# Patient Record
Sex: Female | Born: 1960 | Race: White | Hispanic: No | Marital: Married | Smoking: Former smoker
Health system: Southern US, Community
[De-identification: ages and names within clinical notes are randomized; demographics above are authoritative.]

## PROBLEM LIST (undated history)

## (undated) DIAGNOSIS — L719 Rosacea, unspecified: Secondary | ICD-10-CM

## (undated) DIAGNOSIS — R519 Headache, unspecified: Secondary | ICD-10-CM

## (undated) DIAGNOSIS — Z87410 Personal history of cervical dysplasia: Secondary | ICD-10-CM

## (undated) DIAGNOSIS — D649 Anemia, unspecified: Secondary | ICD-10-CM

## (undated) DIAGNOSIS — Z531 Procedure and treatment not carried out because of patient's decision for reasons of belief and group pressure: Secondary | ICD-10-CM

## (undated) DIAGNOSIS — T753XXA Motion sickness, initial encounter: Secondary | ICD-10-CM

## (undated) DIAGNOSIS — K635 Polyp of colon: Secondary | ICD-10-CM

## (undated) DIAGNOSIS — R51 Headache: Secondary | ICD-10-CM

## (undated) DIAGNOSIS — T8859XA Other complications of anesthesia, initial encounter: Secondary | ICD-10-CM

## (undated) DIAGNOSIS — N3941 Urge incontinence: Secondary | ICD-10-CM

## (undated) DIAGNOSIS — Z803 Family history of malignant neoplasm of breast: Secondary | ICD-10-CM

## (undated) DIAGNOSIS — K219 Gastro-esophageal reflux disease without esophagitis: Secondary | ICD-10-CM

## (undated) DIAGNOSIS — IMO0001 Reserved for inherently not codable concepts without codable children: Secondary | ICD-10-CM

## (undated) DIAGNOSIS — M1990 Unspecified osteoarthritis, unspecified site: Secondary | ICD-10-CM

## (undated) DIAGNOSIS — I10 Essential (primary) hypertension: Secondary | ICD-10-CM

## (undated) DIAGNOSIS — F32A Depression, unspecified: Secondary | ICD-10-CM

## (undated) DIAGNOSIS — N6019 Diffuse cystic mastopathy of unspecified breast: Secondary | ICD-10-CM

## (undated) DIAGNOSIS — Z8489 Family history of other specified conditions: Secondary | ICD-10-CM

## (undated) DIAGNOSIS — F419 Anxiety disorder, unspecified: Secondary | ICD-10-CM

## (undated) DIAGNOSIS — F5101 Primary insomnia: Secondary | ICD-10-CM

## (undated) DIAGNOSIS — M797 Fibromyalgia: Secondary | ICD-10-CM

## (undated) DIAGNOSIS — J45909 Unspecified asthma, uncomplicated: Secondary | ICD-10-CM

## (undated) DIAGNOSIS — J189 Pneumonia, unspecified organism: Secondary | ICD-10-CM

## (undated) HISTORY — PX: KNEE ARTHROSCOPY: SUR90

## (undated) HISTORY — PX: BACK SURGERY: SHX140

## (undated) HISTORY — DX: Family history of malignant neoplasm of breast: Z80.3

---

## 1990-06-07 HISTORY — PX: LAMINECTOMY: SHX219

## 2005-08-11 ENCOUNTER — Emergency Department: Payer: Self-pay | Admitting: Emergency Medicine

## 2006-06-07 HISTORY — PX: HAND SURGERY: SHX662

## 2009-06-07 HISTORY — PX: TOTAL ABDOMINAL HYSTERECTOMY: SHX209

## 2009-08-19 ENCOUNTER — Ambulatory Visit: Payer: Self-pay

## 2009-11-25 ENCOUNTER — Ambulatory Visit: Payer: Self-pay | Admitting: Unknown Physician Specialty

## 2009-12-23 ENCOUNTER — Ambulatory Visit: Payer: Self-pay | Admitting: Family Medicine

## 2010-01-01 ENCOUNTER — Ambulatory Visit: Payer: Self-pay | Admitting: Dermatology

## 2010-04-28 ENCOUNTER — Ambulatory Visit: Payer: Self-pay | Admitting: Unknown Physician Specialty

## 2010-05-06 ENCOUNTER — Ambulatory Visit: Payer: Self-pay | Admitting: Unknown Physician Specialty

## 2010-05-11 LAB — PATHOLOGY REPORT

## 2013-05-29 DIAGNOSIS — J45909 Unspecified asthma, uncomplicated: Secondary | ICD-10-CM | POA: Insufficient documentation

## 2014-01-05 HISTORY — PX: MENISCUS REPAIR: SHX5179

## 2014-06-04 LAB — TSH: TSH: 1.3 u[IU]/mL (ref <=5.90)

## 2014-06-04 LAB — LIPID PANEL
CHOLESTEROL: 223 mg/dL — AB (ref 0–200)
HDL: 52 mg/dL (ref 35–70)
LDL Cholesterol: 133 mg/dL
TRIGLYCERIDES: 189 mg/dL — AB (ref 40–160)

## 2014-06-04 LAB — HM MAMMOGRAPHY

## 2014-12-03 ENCOUNTER — Encounter: Payer: Self-pay | Admitting: Internal Medicine

## 2014-12-03 ENCOUNTER — Other Ambulatory Visit: Payer: Self-pay | Admitting: Internal Medicine

## 2014-12-03 DIAGNOSIS — R7989 Other specified abnormal findings of blood chemistry: Secondary | ICD-10-CM | POA: Insufficient documentation

## 2014-12-03 DIAGNOSIS — N951 Menopausal and female climacteric states: Secondary | ICD-10-CM | POA: Insufficient documentation

## 2014-12-03 DIAGNOSIS — L719 Rosacea, unspecified: Secondary | ICD-10-CM | POA: Insufficient documentation

## 2014-12-03 DIAGNOSIS — J4520 Mild intermittent asthma, uncomplicated: Secondary | ICD-10-CM | POA: Insufficient documentation

## 2014-12-03 DIAGNOSIS — F5101 Primary insomnia: Secondary | ICD-10-CM | POA: Insufficient documentation

## 2014-12-03 DIAGNOSIS — M797 Fibromyalgia: Secondary | ICD-10-CM | POA: Insufficient documentation

## 2014-12-03 DIAGNOSIS — F32A Depression, unspecified: Secondary | ICD-10-CM | POA: Insufficient documentation

## 2014-12-03 DIAGNOSIS — Z803 Family history of malignant neoplasm of breast: Secondary | ICD-10-CM | POA: Insufficient documentation

## 2014-12-03 DIAGNOSIS — F329 Major depressive disorder, single episode, unspecified: Secondary | ICD-10-CM | POA: Insufficient documentation

## 2014-12-03 DIAGNOSIS — I10 Essential (primary) hypertension: Secondary | ICD-10-CM | POA: Insufficient documentation

## 2014-12-03 DIAGNOSIS — Z8711 Personal history of peptic ulcer disease: Secondary | ICD-10-CM | POA: Insufficient documentation

## 2014-12-03 DIAGNOSIS — G43009 Migraine without aura, not intractable, without status migrainosus: Secondary | ICD-10-CM | POA: Insufficient documentation

## 2014-12-03 DIAGNOSIS — N3941 Urge incontinence: Secondary | ICD-10-CM | POA: Insufficient documentation

## 2015-03-21 ENCOUNTER — Other Ambulatory Visit: Payer: Self-pay

## 2015-03-21 MED ORDER — LISINOPRIL-HYDROCHLOROTHIAZIDE 20-12.5 MG PO TABS
1.0000 | ORAL_TABLET | Freq: Every day | ORAL | Status: DC
Start: 2015-03-21 — End: 2015-04-22

## 2015-03-21 MED ORDER — SUMATRIPTAN SUCCINATE 100 MG PO TABS: 100.0000 mg | ORAL_TABLET | Freq: Every day | ORAL | Status: DC | PRN

## 2015-04-22 ENCOUNTER — Ambulatory Visit (INDEPENDENT_AMBULATORY_CARE_PROVIDER_SITE_OTHER): Payer: BLUE CROSS/BLUE SHIELD | Admitting: Internal Medicine

## 2015-04-22 ENCOUNTER — Other Ambulatory Visit: Payer: Self-pay | Admitting: Internal Medicine

## 2015-04-22 ENCOUNTER — Encounter: Payer: Self-pay | Admitting: Internal Medicine

## 2015-04-22 VITALS — BP 110/64 | HR 76 | Ht 66.0 in | Wt 213.2 lb

## 2015-04-22 DIAGNOSIS — Z114 Encounter for screening for human immunodeficiency virus [HIV]: Secondary | ICD-10-CM

## 2015-04-22 DIAGNOSIS — I10 Essential (primary) hypertension: Secondary | ICD-10-CM

## 2015-04-22 DIAGNOSIS — Z8711 Personal history of peptic ulcer disease: Secondary | ICD-10-CM | POA: Diagnosis not present

## 2015-04-22 DIAGNOSIS — J4520 Mild intermittent asthma, uncomplicated: Secondary | ICD-10-CM | POA: Diagnosis not present

## 2015-04-22 DIAGNOSIS — R7989 Other specified abnormal findings of blood chemistry: Secondary | ICD-10-CM | POA: Diagnosis not present

## 2015-04-22 DIAGNOSIS — Z1159 Encounter for screening for other viral diseases: Secondary | ICD-10-CM

## 2015-04-22 MED ORDER — LISINOPRIL-HYDROCHLOROTHIAZIDE 20-12.5 MG PO TABS: 1.0000 | ORAL_TABLET | Freq: Every day | ORAL | Status: DC

## 2015-04-22 NOTE — Progress Notes (Signed)
Date:  04/22/2015   Name:  Alicia Nichols   DOB:  Dec 20, 1960   MRN:  [id number]   Chief Complaint: Hypertension Hypertension This is a chronic problem. The current episode started more than 1 year ago. The problem is unchanged. Associated symptoms include headaches. Pertinent negatives include no chest pain, palpitations or shortness of breath. Past treatments include ACE inhibitors and diuretics. The current treatment provides significant improvement. There are no compliance problems.   Gastroesophageal Reflux She reports no abdominal pain, no chest pain or no choking. This is a chronic problem. The problem occurs rarely. The problem has been unchanged. Pertinent negatives include no fatigue. There are no known risk factors. She has tried a histamine-2 antagonist for the symptoms.  Migraine  This is a recurrent problem. The current episode started more than 1 year ago. The problem has been unchanged. The pain does not radiate. Pertinent negatives include no abdominal pain, hearing loss or numbness. The symptoms are aggravated by weather changes. She has tried triptans for the symptoms. Her past medical history is significant for hypertension.       Review of Systems  Constitutional: Negative for chills, diaphoresis and fatigue.  HENT: Negative for hearing loss.   Eyes: Negative for visual disturbance.  Respiratory: Negative for choking, chest tightness and shortness of breath.   Cardiovascular: Negative for chest pain, palpitations and leg swelling.  Gastrointestinal: Negative for abdominal pain.  Musculoskeletal: Positive for myalgias and arthralgias. Negative for joint swelling.  Skin: Negative for rash.  Neurological: Positive for headaches. Negative for tremors, light-headedness and numbness.  Hematological: Negative for adenopathy.  Psychiatric/Behavioral: Negative for dysphoric mood. The patient is not nervous/anxious.     Patient Active Problem List   Diagnosis Date Noted  .  Essential (primary) hypertension 12/03/2014  . Abnormal thyroid stimulating hormone (TSH) level 12/03/2014  . Urge incontinence 12/03/2014  . H/O peptic ulcer 12/03/2014  . Myalgia and myositis 12/03/2014  . Acne erythematosa 12/03/2014  . Migraine without aura and without status migrainosus, not intractable 12/03/2014  . Clinical depression 12/03/2014  . Asthma, mild intermittent 12/03/2014  . Hot flash, menopausal 12/03/2014  . Idiopathic insomnia 12/03/2014  . Family history of breast cancer 12/03/2014    Prior to Admission medications   Medication Sig Start Date End Date Taking? Authorizing Provider  Chlorzoxazone (LORZONE) 750 MG TABS Take 1 tablet by mouth daily.   Yes Historical Provider, MD  diclofenac sodium (VOLTAREN) 1 % GEL Apply topically 4 (four) times daily.   Yes Historical Provider, MD  estradiol (VIVELLE-DOT) 0.1 MG/24HR patch APPLY 1 PATCH TOPICALLY TWICE WEEKLY 12/03/14  Yes Glean Hess, MD  lisinopril-hydrochlorothiazide (PRINZIDE,ZESTORETIC) 20-12.5 MG tablet Take 1 tablet by mouth daily. 03/21/15  Yes Glean Hess, MD  LYRICA 50 MG capsule Take 5 capsules by mouth daily. 03/31/15  Yes Historical Provider, MD  methocarbamol (ROBAXIN) 500 MG tablet Take 1 tablet by mouth 3 (three) times daily.   Yes Historical Provider, MD  oxyCODONE-acetaminophen (PERCOCET) 7.5-325 MG per tablet Take 1 tablet by mouth 3 (three) times daily.   Yes Historical Provider, MD  ranitidine (ZANTAC) 150 MG tablet Take 1 tablet by mouth 2 (two) times daily.   Yes Historical Provider, MD  SUMAtriptan (IMITREX) 100 MG tablet Take 1 tablet (100 mg total) by mouth daily as needed. 03/21/15  Yes Glean Hess, MD    Allergies  Allergen Reactions  . Aspirin Shortness Of Breath  . Hydrocodone Shortness Of Breath  . Bupropion Hcl  intolerance  . Codeine Itching    Other reaction(s): Rash  . Divalproex Sodium Er Nausea And Vomiting  . Duloxetine Hcl     intolerance  .  Escitalopram     intolerance  . Morphine Sulfate Itching  . Naproxen     delusions  . Oxycodone Hcl Nausea And Vomiting  . Prednisone     delusions  . Ampicillin Rash  . Fentanyl Rash  . Sulfa Antibiotics Rash    Past Surgical History  Procedure Laterality Date  . Meniscus repair Right 01/2014  . Total abdominal hysterectomy  2011  . Hand surgery Left   . Laminectomy  1992    Social History  Substance Use Topics  . Smoking status: Never Smoker   . Smokeless tobacco: None  . Alcohol Use: 1.2 oz/week    2 Standard drinks or equivalent per week     Medication list has been reviewed and updated.   Physical Exam  Constitutional: She is oriented to person, place, and time. She appears well-developed. No distress.  HENT:  Head: Normocephalic and atraumatic.  Eyes: Conjunctivae are normal. Right eye exhibits no discharge. Left eye exhibits no discharge. No scleral icterus.  Neck: Normal range of motion. Neck supple.  Cardiovascular: Normal rate, regular rhythm and normal heart sounds.   Pulmonary/Chest: Effort normal and breath sounds normal. No respiratory distress. She has no wheezes. She has no rales.  Musculoskeletal: Normal range of motion. She exhibits no edema.  Neurological: She is alert and oriented to person, place, and time.  Skin: Skin is warm and dry. No rash noted.  Psychiatric: She has a normal mood and affect. Her behavior is normal. Thought content normal.  Nursing note and vitals reviewed.   BP 110/64 mmHg  Pulse 76  Ht 5\' 6"  (1.676 m)  Wt 213 lb 3.2 oz (96.707 kg)  BMI 34.43 kg/m2  Assessment and Plan: 1. Essential (primary) hypertension Well-controlled - lisinopril-hydrochlorothiazide (PRINZIDE,ZESTORETIC) 20-12.5 MG tablet; Take 1 tablet by mouth daily.  Dispense: 90 tablet; Refill: 3 - Comprehensive metabolic panel - CBC with Differential/Platelet  2. Abnormal thyroid stimulating hormone (TSH) level Will check intermittently - TSH  3. H/O  peptic ulcer No recurrence of symptoms; continue daily ranitidine  4. Asthma, mild intermittent, uncomplicated Stable without recent exacerbation  5. Encounter for screening for HIV - HIV antibody  6. Need for hepatitis C screening test - Hepatitis C antibody   Halina Maidens, MD [facility]  04/22/2015

## 2015-04-23 LAB — COMPREHENSIVE METABOLIC PANEL
A/G RATIO: 1.8 (ref 1.1–2.5)
ALK PHOS: 80 IU/L (ref 39–117)
ALT: 14 IU/L (ref 0–32)
AST: 18 IU/L (ref 0–40)
Albumin: 4.2 g/dL (ref 3.5–5.5)
BILIRUBIN TOTAL: 0.3 mg/dL (ref 0.0–1.2)
BUN / CREAT RATIO: 10 (ref 9–23)
BUN: 7 mg/dL (ref 6–24)
CHLORIDE: 99 mmol/L (ref 97–106)
CO2: 25 mmol/L (ref 18–29)
Calcium: 9.1 mg/dL (ref 8.7–10.2)
Creatinine, Ser: 0.73 mg/dL (ref 0.57–1.00)
GFR calc Af Amer: 110 mL/min/{1.73_m2} (ref >59)
GFR calc non Af Amer: 95 mL/min/{1.73_m2} (ref >59)
GLUCOSE: 86 mg/dL (ref 65–99)
Globulin, Total: 2.4 g/dL (ref 1.5–4.5)
Potassium: 4 mmol/L (ref 3.5–5.2)
Sodium: 139 mmol/L (ref 136–144)
TOTAL PROTEIN: 6.6 g/dL (ref 6.0–8.5)

## 2015-04-23 LAB — CBC WITH DIFFERENTIAL/PLATELET
BASOS ABS: 0 10*3/uL (ref 0.0–0.2)
Basos: 1 %
EOS (ABSOLUTE): 0.1 10*3/uL (ref 0.0–0.4)
Eos: 2 %
Hematocrit: 37.1 % (ref 34.0–46.6)
Hemoglobin: 12.2 g/dL (ref 11.1–15.9)
IMMATURE GRANULOCYTES: 0 %
Immature Grans (Abs): 0 10*3/uL (ref 0.0–0.1)
LYMPHS ABS: 0.9 10*3/uL (ref 0.7–3.1)
Lymphs: 26 %
MCH: 29.1 pg (ref 26.6–33.0)
MCHC: 32.9 g/dL (ref 31.5–35.7)
MCV: 89 fL (ref 79–97)
MONOS ABS: 0.4 10*3/uL (ref 0.1–0.9)
Monocytes: 12 %
NEUTROS PCT: 59 %
Neutrophils Absolute: 2 10*3/uL (ref 1.4–7.0)
PLATELETS: 283 10*3/uL (ref 150–379)
RBC: 4.19 x10E6/uL (ref 3.77–5.28)
RDW: 13.6 % (ref 12.3–15.4)
WBC: 3.5 10*3/uL (ref 3.4–10.8)

## 2015-04-23 LAB — TSH: TSH: 0.965 u[IU]/mL (ref 0.450–4.500)

## 2015-04-23 LAB — HEPATITIS C ANTIBODY: Hep C Virus Ab: <0.1 s/co ratio (ref 0.0–0.9)

## 2015-04-23 LAB — HIV ANTIBODY (ROUTINE TESTING W REFLEX): HIV Screen 4th Generation wRfx: NONREACTIVE

## 2015-06-06 ENCOUNTER — Encounter: Payer: Self-pay | Admitting: Internal Medicine

## 2015-06-06 DIAGNOSIS — N6019 Diffuse cystic mastopathy of unspecified breast: Secondary | ICD-10-CM | POA: Insufficient documentation

## 2015-06-08 HISTORY — PX: BILATERAL CARPAL TUNNEL RELEASE: SHX6508

## 2015-08-04 ENCOUNTER — Other Ambulatory Visit: Payer: Self-pay | Admitting: Internal Medicine

## 2015-09-24 ENCOUNTER — Other Ambulatory Visit: Payer: Self-pay | Admitting: Internal Medicine

## 2015-10-21 ENCOUNTER — Ambulatory Visit (INDEPENDENT_AMBULATORY_CARE_PROVIDER_SITE_OTHER): Payer: BLUE CROSS/BLUE SHIELD | Admitting: Internal Medicine

## 2015-10-21 ENCOUNTER — Encounter: Payer: Self-pay | Admitting: Internal Medicine

## 2015-10-21 VITALS — BP 140/84 | HR 63 | Resp 16 | Ht 66.0 in | Wt 205.0 lb

## 2015-10-21 DIAGNOSIS — I10 Essential (primary) hypertension: Secondary | ICD-10-CM | POA: Diagnosis not present

## 2015-10-21 DIAGNOSIS — S83519A Sprain of anterior cruciate ligament of unspecified knee, initial encounter: Secondary | ICD-10-CM | POA: Insufficient documentation

## 2015-10-21 DIAGNOSIS — L719 Rosacea, unspecified: Secondary | ICD-10-CM | POA: Insufficient documentation

## 2015-10-21 DIAGNOSIS — K5900 Constipation, unspecified: Secondary | ICD-10-CM | POA: Diagnosis not present

## 2015-10-21 DIAGNOSIS — N951 Menopausal and female climacteric states: Secondary | ICD-10-CM | POA: Diagnosis not present

## 2015-10-21 DIAGNOSIS — Z1211 Encounter for screening for malignant neoplasm of colon: Secondary | ICD-10-CM

## 2015-10-21 DIAGNOSIS — S83511A Sprain of anterior cruciate ligament of right knee, initial encounter: Secondary | ICD-10-CM

## 2015-10-21 HISTORY — DX: Sprain of anterior cruciate ligament of unspecified knee, initial encounter: S83.519A

## 2015-10-21 MED ORDER — ESTRADIOL 0.1 MG/24HR TD PTTW: 1.0000 | MEDICATED_PATCH | TRANSDERMAL | Status: DC

## 2015-10-21 MED ORDER — DOXYCYCLINE HYCLATE 100 MG PO TABS: 100.0000 mg | ORAL_TABLET | Freq: Every day | ORAL | Status: DC

## 2015-10-21 NOTE — Progress Notes (Signed)
Date:  10/21/2015   Name:  Alicia Nichols   DOB:  1960-06-28   MRN:  [id number]   Chief Complaint: Hypertension Hypertension This is a chronic problem. The current episode started more than 1 year ago. The problem is unchanged. The problem is controlled. Associated symptoms include neck pain. Pertinent negatives include no chest pain, palpitations or shortness of breath. Past treatments include ACE inhibitors and diuretics. The current treatment provides significant improvement.  Knee Pain - had a known torn meniscus that she has been treating conservatively with brace, ice and topicals.  Two weeks ago she re-injured her knee and now has a torn ACL. She has pain medication and lyrica already for her back and neck so will continue those. Constipation - she has recently noted a change in bowel habits. She is more constipated than previously. She tried MiraLAX without benefit. Finally took Dulcolax laxatives and had some results. Now she's taking Dulcolax about once per week to maintain 3 bowel movements weekly. She reports never having had a colonoscopy. She denies rectal bleeding or abdominal pain. There is no close family history of colon cancer. Rosacea - Patient has been taking doxycycline 100 mg daily for rosacea. This was prescribed by her dermatologist Dr. Phillip Heal. However she wants to switch dermatologist because she is having a difficult time getting an appointment. She's been out of medications for the past several months and is having a flare. She wonders if I would prescribe doxycycline until she can establish with a new dermatologist.  Review of Systems  Constitutional: Negative for chills and fatigue.  Respiratory: Negative for cough, chest tightness and shortness of breath.   Cardiovascular: Negative for chest pain, palpitations and leg swelling.  Gastrointestinal: Negative for blood in stool.  Musculoskeletal: Positive for arthralgias and neck pain.  Skin: Positive for color change  (bluish lesion on back) and rash (rosacea flare).  Psychiatric/Behavioral: Negative for sleep disturbance and dysphoric mood.    Patient Active Problem List   Diagnosis Date Noted  . Fibrocystic breast changes 06/06/2015  . Essential (primary) hypertension 12/03/2014  . Abnormal thyroid stimulating hormone (TSH) level 12/03/2014  . Urge incontinence 12/03/2014  . H/O peptic ulcer 12/03/2014  . Myalgia and myositis 12/03/2014  . Acne erythematosa 12/03/2014  . Migraine without aura and without status migrainosus, not intractable 12/03/2014  . Clinical depression 12/03/2014  . Asthma, mild intermittent 12/03/2014  . Hot flash, menopausal 12/03/2014  . Idiopathic insomnia 12/03/2014  . Family history of breast cancer 12/03/2014  . Airway hyperreactivity 05/29/2013    Prior to Admission medications   Medication Sig Start Date End Date Taking? Authorizing Provider  Chlorzoxazone (LORZONE) 750 MG TABS Take 1 tablet by mouth daily.   Yes Historical Provider, MD  diclofenac sodium (VOLTAREN) 1 % GEL Apply topically 4 (four) times daily.   Yes Historical Provider, MD  estradiol (VIVELLE-DOT) 0.1 MG/24HR patch UNWRAP AND APPLY 1 PATCH TO SKIN TWICE WEEKLY 09/24/15  Yes Glean Hess, MD  lidocaine (XYLOCAINE) 5 % ointment APPLY 2-3 GRAMS (1 GRAM=1 INCH) TO AFFECTED AREA 4 TIMES DAILY 09/05/15  Yes Historical Provider, MD  lisinopril-hydrochlorothiazide (PRINZIDE,ZESTORETIC) 20-12.5 MG tablet TAKE 1 TABLET BY MOUTH DAILY 08/04/15  Yes Glean Hess, MD  LYRICA 50 MG capsule Take 2 capsules by mouth Nightly. As needed can take 1 during day also 03/31/15  Yes Historical Provider, MD  methocarbamol (ROBAXIN) 500 MG tablet Take 1 tablet by mouth 3 (three) times daily.   Yes Historical  Provider, MD  oxyCODONE-acetaminophen (PERCOCET) 7.5-325 MG per tablet Take 1 tablet by mouth 3 (three) times daily.   Yes Historical Provider, MD  ranitidine (ZANTAC) 150 MG tablet Take 1 tablet by mouth 2 (two)  times daily.   Yes Historical Provider, MD  SUMAtriptan (IMITREX) 100 MG tablet Take 1 tablet (100 mg total) by mouth daily as needed. 03/21/15  Yes Glean Hess, MD    Allergies  Allergen Reactions  . Aspirin Shortness Of Breath  . Hydrocodone Shortness Of Breath  . Bupropion Hcl     intolerance  . Codeine Itching    Other reaction(s): Rash  . Divalproex Sodium Er Nausea And Vomiting  . Duloxetine Hcl     intolerance  . Escitalopram     intolerance  . Morphine Sulfate Itching  . Naproxen     delusions  . Oxycodone Hcl Nausea And Vomiting  . Prednisone     delusions  . Ampicillin Rash  . Fentanyl Rash  . Sulfa Antibiotics Rash    Past Surgical History  Procedure Laterality Date  . Meniscus repair Right 01/2014  . Total abdominal hysterectomy  2011  . Hand surgery Left   . Laminectomy  1992    Social History  Substance Use Topics  . Smoking status: Never Smoker   . Smokeless tobacco: None  . Alcohol Use: 1.2 oz/week    2 Standard drinks or equivalent per week    Medication list has been reviewed and updated.   Physical Exam  Constitutional: She is oriented to person, place, and time. She appears well-developed. No distress.  HENT:  Head: Normocephalic and atraumatic.  Neck: Normal range of motion. No thyromegaly present.  Cardiovascular: Normal rate, regular rhythm and normal heart sounds.   Pulmonary/Chest: Effort normal and breath sounds normal. No respiratory distress.  Musculoskeletal: She exhibits no edema or tenderness.  Lymphadenopathy:    She has no cervical adenopathy.  Neurological: She is alert and oriented to person, place, and time.  Skin: Skin is warm and dry. No rash noted.  Mild redness around nasal folds  Psychiatric: She has a normal mood and affect. Her behavior is normal. Thought content normal.  Nursing note and vitals reviewed.   BP 140/84 mmHg  Pulse 63  Resp 16  Ht 5\' 6"  (1.676 m)  Wt 205 lb (92.987 kg)  BMI 33.10 kg/m2   SpO2 99%  Assessment and Plan: 1. Essential (primary) hypertension controlled  2. Hot flash, menopausal - estradiol (VIVELLE-DOT) 0.1 MG/24HR patch; Place 1 patch (0.1 mg total) onto the skin 2 (two) times a week.  Dispense: 8 patch; Refill: 12  3. ACL (anterior cruciate ligament) tear, right, initial encounter Follow up with Orthopedics  4. Colon cancer screening Refer to [facility] per patient request - Ambulatory referral to Gastroenterology  5. Rosacea Establish follow up care with new Dermatologist - doxycycline (VIBRA-TABS) 100 MG tablet; Take 1 tablet (100 mg total) by mouth daily.  Dispense: 30 tablet; Refill: 12  6. Constipation, unspecified constipation type Continue limited use of laxatives Add probiotics   Halina Maidens, MD [facility]  10/21/2015

## 2015-11-04 ENCOUNTER — Ambulatory Visit (INDEPENDENT_AMBULATORY_CARE_PROVIDER_SITE_OTHER): Payer: BLUE CROSS/BLUE SHIELD | Admitting: Internal Medicine

## 2015-11-04 ENCOUNTER — Encounter: Payer: Self-pay | Admitting: Internal Medicine

## 2015-11-04 VITALS — BP 138/84 | HR 67 | Resp 16 | Ht 66.0 in | Wt 207.0 lb

## 2015-11-04 DIAGNOSIS — Z1211 Encounter for screening for malignant neoplasm of colon: Secondary | ICD-10-CM

## 2015-11-04 DIAGNOSIS — G43009 Migraine without aura, not intractable, without status migrainosus: Secondary | ICD-10-CM

## 2015-11-04 DIAGNOSIS — I10 Essential (primary) hypertension: Secondary | ICD-10-CM | POA: Diagnosis not present

## 2015-11-04 DIAGNOSIS — Z0000 Encounter for general adult medical examination without abnormal findings: Secondary | ICD-10-CM | POA: Diagnosis not present

## 2015-11-04 DIAGNOSIS — Z8711 Personal history of peptic ulcer disease: Secondary | ICD-10-CM | POA: Diagnosis not present

## 2015-11-04 DIAGNOSIS — L309 Dermatitis, unspecified: Secondary | ICD-10-CM | POA: Diagnosis not present

## 2015-11-04 DIAGNOSIS — R7989 Other specified abnormal findings of blood chemistry: Secondary | ICD-10-CM

## 2015-11-04 LAB — POCT URINALYSIS DIPSTICK
BILIRUBIN UA: NEGATIVE
Glucose, UA: NEGATIVE
KETONES UA: NEGATIVE
Leukocytes, UA: NEGATIVE
NITRITE UA: NEGATIVE
PH UA: 6
PROTEIN UA: NEGATIVE
RBC UA: NEGATIVE
Spec Grav, UA: 1.015
Urobilinogen, UA: 0.2

## 2015-11-04 MED ORDER — CLOCORTOLONE PIVALATE 0.1 % EX CREA: 1.0000 "application " | TOPICAL_CREAM | Freq: Two times a day (BID) | CUTANEOUS | Status: DC

## 2015-11-04 NOTE — Progress Notes (Signed)
Date:  11/04/2015   Name:  Alicia Nichols   DOB:  Jul 22, 1960   MRN:  [id number]   Chief Complaint: Annual Exam [name] is a 55 y.o. female who presents today for her Complete Annual Exam. She feels fairly well. She reports exercising stayiang busy. She reports she is sleeping fairly well getting about 7 hours per night.   Hypertension This is a chronic problem. The current episode started more than 1 year ago. The problem is unchanged. The problem is controlled. Associated symptoms include headaches (unchanged migraines). Pertinent negatives include no chest pain, palpitations or shortness of breath. Past treatments include ACE inhibitors and diuretics. The current treatment provides significant improvement. There are no compliance problems.   Migraine  This is a recurrent problem. The current episode started more than 1 year ago. The problem occurs intermittently. Pertinent negatives include no abdominal pain, coughing, dizziness, fever, hearing loss, tinnitus or vomiting. Her past medical history is significant for hypertension.  Gastroesophageal Reflux She reports no abdominal pain, no chest pain, no coughing, no heartburn or no wheezing. This is a recurrent problem. The current episode started more than 1 year ago. Pertinent negatives include no fatigue. She has tried a histamine-2 antagonist for the symptoms. The treatment provided significant relief. hx of ulcer.   Pruritis - she has an area on her left mid back that is chronically itchy.  She uses cloderm from Dermatology but it out.  The source is from nerve irritation rather than pain according to the specialist.   Review of Systems  Constitutional: Negative for fever, chills and fatigue.  HENT: Negative for congestion, hearing loss, tinnitus, trouble swallowing and voice change.   Eyes: Negative for visual disturbance.  Respiratory: Negative for cough, chest tightness, shortness of breath and wheezing.   Cardiovascular:  Negative for chest pain, palpitations and leg swelling.  Gastrointestinal: Negative for heartburn, vomiting, abdominal pain, diarrhea and constipation.  Endocrine: Negative for polydipsia and polyuria.  Genitourinary: Negative for dysuria, frequency, vaginal discharge, genital sores and pelvic pain.  Musculoskeletal: Positive for myalgias and arthralgias. Negative for joint swelling and gait problem.  Skin: Negative for color change and rash (itching).  Neurological: Positive for headaches (unchanged migraines). Negative for dizziness, tremors and light-headedness.  Hematological: Negative for adenopathy. Does not bruise/bleed easily.  Psychiatric/Behavioral: Negative for sleep disturbance and dysphoric mood. The patient is not nervous/anxious.     Patient Active Problem List   Diagnosis Date Noted  . ACL (anterior cruciate ligament) tear 10/21/2015  . Rosacea 10/21/2015  . Fibrocystic breast changes 06/06/2015  . Essential (primary) hypertension 12/03/2014  . Abnormal thyroid stimulating hormone (TSH) level 12/03/2014  . Urge incontinence 12/03/2014  . H/O peptic ulcer 12/03/2014  . Myalgia and myositis 12/03/2014  . Acne erythematosa 12/03/2014  . Migraine without aura and without status migrainosus, not intractable 12/03/2014  . Clinical depression 12/03/2014  . Asthma, mild intermittent 12/03/2014  . Hot flash, menopausal 12/03/2014  . Idiopathic insomnia 12/03/2014  . Family history of breast cancer 12/03/2014    Prior to Admission medications   Medication Sig Start Date End Date Taking? Authorizing Provider  Chlorzoxazone (LORZONE) 750 MG TABS Take 1 tablet by mouth daily.   Yes Historical Provider, MD  diclofenac sodium (VOLTAREN) 1 % GEL Apply topically 4 (four) times daily.   Yes Historical Provider, MD  doxycycline (VIBRA-TABS) 100 MG tablet Take 1 tablet (100 mg total) by mouth daily. 10/21/15  Yes Glean Hess, MD  estradiol (  VIVELLE-DOT) 0.1 MG/24HR patch Place 1  patch (0.1 mg total) onto the skin 2 (two) times a week. 10/21/15  Yes Glean Hess, MD  lidocaine (XYLOCAINE) 5 % ointment APPLY 2-3 GRAMS (1 GRAM=1 INCH) TO AFFECTED AREA 4 TIMES DAILY 09/05/15  Yes Historical Provider, MD  lisinopril-hydrochlorothiazide (PRINZIDE,ZESTORETIC) 20-12.5 MG tablet TAKE 1 TABLET BY MOUTH DAILY 08/04/15  Yes Glean Hess, MD  LYRICA 50 MG capsule Take 2 capsules by mouth Nightly. As needed can take 1 during day also 03/31/15  Yes Historical Provider, MD  methocarbamol (ROBAXIN) 500 MG tablet Take 1 tablet by mouth 3 (three) times daily.   Yes Historical Provider, MD  oxyCODONE-acetaminophen (PERCOCET) 7.5-325 MG per tablet Take 1 tablet by mouth 3 (three) times daily.   Yes Historical Provider, MD  ranitidine (ZANTAC) 150 MG tablet Take 1 tablet by mouth 2 (two) times daily.   Yes Historical Provider, MD  SUMAtriptan (IMITREX) 100 MG tablet Take 1 tablet (100 mg total) by mouth daily as needed. 03/21/15  Yes Glean Hess, MD    Allergies  Allergen Reactions  . Aspirin Shortness Of Breath  . Hydrocodone Shortness Of Breath  . Bupropion Hcl     intolerance  . Codeine Itching    Other reaction(s): Rash  . Divalproex Sodium Er Nausea And Vomiting  . Duloxetine Hcl     intolerance  . Escitalopram     intolerance  . Morphine Sulfate Itching  . Naproxen     delusions  . Oxycodone Hcl Nausea And Vomiting  . Prednisone     delusions  . Ampicillin Rash  . Fentanyl Rash  . Sulfa Antibiotics Rash    Past Surgical History  Procedure Laterality Date  . Meniscus repair Right 01/2014  . Total abdominal hysterectomy  2011  . Hand surgery Left   . Laminectomy  1992    Social History  Substance Use Topics  . Smoking status: Never Smoker   . Smokeless tobacco: None  . Alcohol Use: 1.2 oz/week    2 Standard drinks or equivalent per week     Medication list has been reviewed and updated.   Physical Exam  Constitutional: She is oriented to person,  place, and time. She appears well-developed and well-nourished. No distress.  HENT:  Head: Normocephalic and atraumatic.  Right Ear: Tympanic membrane and ear canal normal.  Left Ear: Tympanic membrane and ear canal normal.  Nose: Right sinus exhibits no maxillary sinus tenderness. Left sinus exhibits no maxillary sinus tenderness.  Mouth/Throat: Uvula is midline and oropharynx is clear and moist.  Eyes: Conjunctivae and EOM are normal. Right eye exhibits no discharge. Left eye exhibits no discharge. No scleral icterus.  Neck: Normal range of motion. Carotid bruit is not present. No erythema present. No thyromegaly present.  Cardiovascular: Normal rate, regular rhythm, normal heart sounds and normal pulses.   Pulmonary/Chest: Effort normal. No respiratory distress. She has no wheezes. Right breast exhibits tenderness. Right breast exhibits no mass, no nipple discharge and no skin change. Left breast exhibits tenderness. Left breast exhibits no mass, no nipple discharge and no skin change. Breasts are symmetrical.  Abdominal: Soft. Bowel sounds are normal. There is no hepatosplenomegaly. There is no tenderness. There is no CVA tenderness.  Musculoskeletal: Normal range of motion.  Lymphadenopathy:    She has no cervical adenopathy.    She has no axillary adenopathy.  Neurological: She is alert and oriented to person, place, and time. She has normal reflexes. No cranial  nerve deficit or sensory deficit.  Skin: Skin is warm, dry and intact. No rash noted.     Psychiatric: She has a normal mood and affect. Her speech is normal and behavior is normal. Thought content normal.  Nursing note and vitals reviewed.   BP 138/84 mmHg  Pulse 67  Resp 16  Ht 5\' 6"  (1.676 m)  Wt 207 lb (93.895 kg)  BMI 33.43 kg/m2  SpO2 98%  Assessment and Plan: 1. Annual physical exam Mammogram due in December at [facility] - pt will schedule - Lipid panel  2. Colon cancer screening Referral done to [facility] 2 weeks ago  - pt given number to call  3. Essential (primary) hypertension controlled - CBC with Differential/Platelet - Comprehensive metabolic panel - POCT urinalysis dipstick  4. H/O peptic ulcer Stable on H2 blocker  5. Migraine without aura and without status migrainosus, not intractable Unchanged; responsive to imitrex  6. Abnormal thyroid stimulating hormone (TSH) level - TSH  7. Dermatitis due to unknown cause Followed by Dr. Aubery Lapping - Clocortolone Pivalate (CLODERM) 0.1 % cream; Apply 1 application topically 2 (two) times daily.  Dispense: 90 g; Refill: [place], MD [facility]  11/04/2015

## 2015-11-04 NOTE — Patient Instructions (Signed)
Breast Self-Awareness Practicing breast self-awareness may pick up problems early, prevent significant medical complications, and possibly save your life. By practicing breast self-awareness, you can become familiar with how your breasts look and feel and if your breasts are changing. This allows you to notice changes early. It can also offer you some reassurance that your breast health is good. One way to learn what is normal for your breasts and whether your breasts are changing is to do a breast self-exam. If you find a lump or something that was not present in the past, it is best to contact your caregiver right away. Other findings that should be evaluated by your caregiver include nipple discharge, especially if it is bloody; skin changes or reddening; areas where the skin seems to be pulled in (retracted); or new lumps and bumps. Breast pain is seldom associated with cancer (malignancy), but should also be evaluated by a caregiver. HOW TO PERFORM A BREAST SELF-EXAM The best time to examine your breasts is 5-7 days after your menstrual period is over. During menstruation, the breasts are lumpier, and it may be more difficult to pick up changes. If you do not menstruate, have reached menopause, or had your uterus removed (hysterectomy), you should examine your breasts at regular intervals, such as monthly. If you are breastfeeding, examine your breasts after a feeding or after using a breast pump. Breast implants do not decrease the risk for lumps or tumors, so continue to perform breast self-exams as recommended. Talk to your caregiver about how to determine the difference between the implant and breast tissue. Also, talk about the amount of pressure you should use during the exam. Over time, you will become more familiar with the variations of your breasts and more comfortable with the exam. A breast self-exam requires you to remove all your clothes above the waist. 1. Look at your breasts and nipples.  Stand in front of a mirror in a room with good lighting. With your hands on your hips, push your hands firmly downward. Look for a difference in shape, contour, and size from one breast to the other (asymmetry). Asymmetry includes puckers, dips, or bumps. Also, look for skin changes, such as reddened or scaly areas on the breasts. Look for nipple changes, such as discharge, dimpling, repositioning, or redness. 2. Carefully feel your breasts. This is best done either in the shower or tub while using soapy water or when flat on your back. Place the arm (on the side of the breast you are examining) above your head. Use the pads (not the fingertips) of your three middle fingers on your opposite hand to feel your breasts. Start in the underarm area and use  inch (2 cm) overlapping circles to feel your breast. Use 3 different levels of pressure (light, medium, and firm pressure) at each circle before moving to the next circle. The light pressure is needed to feel the tissue closest to the skin. The medium pressure will help to feel breast tissue a little deeper, while the firm pressure is needed to feel the tissue close to the ribs. Continue the overlapping circles, moving downward over the breast until you feel your ribs below your breast. Then, move one finger-width towards the center of the body. Continue to use the  inch (2 cm) overlapping circles to feel your breast as you move slowly up toward the collar bone (clavicle) near the base of the neck. Continue the up and down exam using all 3 pressures until you reach the   middle of the chest. Do this with each breast, carefully feeling for lumps or changes. 3.  Keep a written record with breast changes or normal findings for each breast. By writing this information down, you do not need to depend only on memory for size, tenderness, or location. Write down where you are in your menstrual cycle, if you are still menstruating. Breast tissue can have some lumps or  thick tissue. However, see your caregiver if you find anything that concerns you.  SEEK MEDICAL CARE IF:  You see a change in shape, contour, or size of your breasts or nipples.   You see skin changes, such as reddened or scaly areas on the breasts or nipples.   You have an unusual discharge from your nipples.   You feel a new lump or unusually thick areas.    This information is not intended to replace advice given to you by your health care provider. Make sure you discuss any questions you have with your health care provider.   Document Released: 05/24/2005 Document Revised: 05/10/2012 Document Reviewed: 09/08/2011 Elsevier Interactive Patient Education 2016 [employer] Inc[employer]  

## 2015-11-05 LAB — CBC WITH DIFFERENTIAL/PLATELET
Basophils Absolute: 0 10*3/uL (ref 0.0–0.2)
Basos: 1 %
EOS (ABSOLUTE): 0.1 10*3/uL (ref 0.0–0.4)
EOS: 2 %
HEMATOCRIT: 37.9 % (ref 34.0–46.6)
HEMOGLOBIN: 12.3 g/dL (ref 11.1–15.9)
IMMATURE GRANULOCYTES: 0 %
Immature Grans (Abs): 0 10*3/uL (ref 0.0–0.1)
LYMPHS ABS: 1.6 10*3/uL (ref 0.7–3.1)
Lymphs: 37 %
MCH: 29.1 pg (ref 26.6–33.0)
MCHC: 32.5 g/dL (ref 31.5–35.7)
MCV: 90 fL (ref 79–97)
MONOCYTES: 9 %
Monocytes Absolute: 0.4 10*3/uL (ref 0.1–0.9)
Neutrophils Absolute: 2.2 10*3/uL (ref 1.4–7.0)
Neutrophils: 51 %
Platelets: 321 10*3/uL (ref 150–379)
RBC: 4.22 x10E6/uL (ref 3.77–5.28)
RDW: 13.5 % (ref 12.3–15.4)
WBC: 4.4 10*3/uL (ref 3.4–10.8)

## 2015-11-05 LAB — COMPREHENSIVE METABOLIC PANEL
ALBUMIN: 4.3 g/dL (ref 3.5–5.5)
ALT: 12 IU/L (ref 0–32)
AST: 14 IU/L (ref 0–40)
Albumin/Globulin Ratio: 1.8 (ref 1.2–2.2)
Alkaline Phosphatase: 72 IU/L (ref 39–117)
BUN / CREAT RATIO: 13 (ref 9–23)
BUN: 10 mg/dL (ref 6–24)
Bilirubin Total: 0.3 mg/dL (ref 0.0–1.2)
CALCIUM: 9 mg/dL (ref 8.7–10.2)
CO2: 24 mmol/L (ref 18–29)
CREATININE: 0.78 mg/dL (ref 0.57–1.00)
Chloride: 95 mmol/L — ABNORMAL LOW (ref 96–106)
GFR calc Af Amer: 100 mL/min/{1.73_m2} (ref >59)
GFR, EST NON AFRICAN AMERICAN: 87 mL/min/{1.73_m2} (ref >59)
GLOBULIN, TOTAL: 2.4 g/dL (ref 1.5–4.5)
Glucose: 70 mg/dL (ref 65–99)
Potassium: 3.5 mmol/L (ref 3.5–5.2)
SODIUM: 138 mmol/L (ref 134–144)
Total Protein: 6.7 g/dL (ref 6.0–8.5)

## 2015-11-05 LAB — LIPID PANEL
CHOL/HDL RATIO: 3.5 ratio (ref 0.0–4.4)
Cholesterol, Total: 218 mg/dL — ABNORMAL HIGH (ref 100–199)
HDL: 62 mg/dL (ref >39)
LDL CALC: 131 mg/dL — AB (ref 0–99)
Triglycerides: 125 mg/dL (ref 0–149)
VLDL Cholesterol Cal: 25 mg/dL (ref 5–40)

## 2015-11-05 LAB — TSH: TSH: 3.03 u[IU]/mL (ref 0.450–4.500)

## 2015-12-26 ENCOUNTER — Encounter: Payer: Self-pay | Admitting: *Deleted

## 2015-12-29 ENCOUNTER — Encounter: Admission: RE | Payer: Self-pay | Source: Ambulatory Visit

## 2015-12-29 ENCOUNTER — Ambulatory Visit
Admission: RE | Admit: 2015-12-29 | Payer: BLUE CROSS/BLUE SHIELD | Source: Ambulatory Visit | Admitting: Gastroenterology

## 2015-12-29 HISTORY — DX: Fibromyalgia: M79.7

## 2015-12-29 HISTORY — DX: Headache, unspecified: R51.9

## 2015-12-29 HISTORY — DX: Essential (primary) hypertension: I10

## 2015-12-29 HISTORY — DX: Anemia, unspecified: D64.9

## 2015-12-29 HISTORY — DX: Headache: R51

## 2015-12-29 HISTORY — DX: Unspecified osteoarthritis, unspecified site: M19.90

## 2015-12-29 SURGERY — COLONOSCOPY WITH PROPOFOL
Anesthesia: General

## 2016-02-05 ENCOUNTER — Encounter: Payer: BLUE CROSS/BLUE SHIELD | Admitting: Internal Medicine

## 2016-04-13 ENCOUNTER — Ambulatory Visit: Admit: 2016-04-13 | Payer: BLUE CROSS/BLUE SHIELD | Admitting: Gastroenterology

## 2016-04-13 SURGERY — COLONOSCOPY WITH PROPOFOL
Anesthesia: General

## 2016-05-04 ENCOUNTER — Ambulatory Visit: Payer: BLUE CROSS/BLUE SHIELD | Admitting: Internal Medicine

## 2016-05-06 ENCOUNTER — Other Ambulatory Visit: Payer: Self-pay | Admitting: Internal Medicine

## 2016-05-07 ENCOUNTER — Encounter: Payer: Self-pay | Admitting: Internal Medicine

## 2016-05-07 ENCOUNTER — Ambulatory Visit (INDEPENDENT_AMBULATORY_CARE_PROVIDER_SITE_OTHER): Payer: BLUE CROSS/BLUE SHIELD | Admitting: Internal Medicine

## 2016-05-07 VITALS — BP 132/82 | HR 72 | Ht 66.0 in | Wt 210.0 lb

## 2016-05-07 DIAGNOSIS — G43009 Migraine without aura, not intractable, without status migrainosus: Secondary | ICD-10-CM

## 2016-05-07 DIAGNOSIS — L719 Rosacea, unspecified: Secondary | ICD-10-CM | POA: Diagnosis not present

## 2016-05-07 DIAGNOSIS — I10 Essential (primary) hypertension: Secondary | ICD-10-CM | POA: Diagnosis not present

## 2016-05-07 MED ORDER — DOXYCYCLINE 40 MG PO CPDR: 40.0000 mg | DELAYED_RELEASE_CAPSULE | ORAL | 3 refills | Status: DC

## 2016-05-07 MED ORDER — SUMATRIPTAN SUCCINATE 100 MG PO TABS: 100.0000 mg | ORAL_TABLET | Freq: Every day | ORAL | 3 refills | Status: DC | PRN

## 2016-05-07 NOTE — Progress Notes (Signed)
Date:  05/07/2016   Name:  Alicia Nichols   DOB:  1961/05/30   MRN:  [id number]   Chief Complaint: Hypertension Hypertension  This is a chronic problem. The current episode started more than 1 year ago. The problem is unchanged. The problem is controlled. Associated symptoms include headaches. Pertinent negatives include no chest pain, palpitations or shortness of breath. Past treatments include ACE inhibitors and diuretics. The current treatment provides significant improvement.  Migraine   This is a chronic problem. The problem occurs intermittently. Pertinent negatives include no abdominal pain, coughing, dizziness, fever, hearing loss, numbness or weakness. Her past medical history is significant for hypertension.  Rosacea - not doing as well with doxycycline.  Has seen Dr. Phillip Heal but struggled with staff issues.  She is considering changing physicians.   Lab Results  Component Value Date   CREATININE 0.78 11/04/2015   Lab Results  Component Value Date   TSH 3.030 11/04/2015   Lab Results  Component Value Date   WBC 4.4 11/04/2015   HCT 37.9 11/04/2015   MCV 90 11/04/2015   PLT 321 11/04/2015      Review of Systems  Constitutional: Negative for chills, fatigue and fever.  HENT: Negative for hearing loss.   Eyes: Negative for visual disturbance.  Respiratory: Negative for cough, chest tightness and shortness of breath.   Cardiovascular: Negative for chest pain, palpitations and leg swelling.  Gastrointestinal: Negative for abdominal pain.  Musculoskeletal: Negative for arthralgias and myalgias.  Skin: Positive for rash.  Neurological: Positive for headaches. Negative for dizziness, tremors, weakness and numbness.  Psychiatric/Behavioral: Negative for dysphoric mood and suicidal ideas. The patient is not nervous/anxious.     Patient Active Problem List   Diagnosis Date Noted  . Dermatitis due to unknown cause 11/04/2015  . ACL (anterior cruciate ligament) tear  10/21/2015  . Rosacea 10/21/2015  . Fibrocystic breast changes 06/06/2015  . Essential (primary) hypertension 12/03/2014  . Abnormal thyroid stimulating hormone (TSH) level 12/03/2014  . Urge incontinence 12/03/2014  . H/O peptic ulcer 12/03/2014  . Myalgia and myositis 12/03/2014  . Acne erythematosa 12/03/2014  . Migraine without aura and without status migrainosus, not intractable 12/03/2014  . Clinical depression 12/03/2014  . Asthma, mild intermittent 12/03/2014  . Hot flash, menopausal 12/03/2014  . Idiopathic insomnia 12/03/2014  . Family history of breast cancer 12/03/2014    Prior to Admission medications   Medication Sig Start Date End Date Taking? Authorizing Provider  Chlorzoxazone (LORZONE) 750 MG TABS Take 1 tablet by mouth daily.    Historical Provider, MD  Clocortolone Pivalate (CLODERM) 0.1 % cream Apply 1 application topically 2 (two) times daily. 11/04/15   Glean Hess, MD  diclofenac sodium (VOLTAREN) 1 % GEL Apply topically 4 (four) times daily.    Historical Provider, MD  doxycycline (VIBRA-TABS) 100 MG tablet Take 1 tablet (100 mg total) by mouth daily. 10/21/15   Glean Hess, MD  estradiol (VIVELLE-DOT) 0.1 MG/24HR patch Place 1 patch (0.1 mg total) onto the skin 2 (two) times a week. 10/21/15   Glean Hess, MD  lidocaine (XYLOCAINE) 5 % ointment APPLY 2-3 GRAMS (1 GRAM=1 INCH) TO AFFECTED AREA 4 TIMES DAILY 09/05/15   Historical Provider, MD  lisinopril-hydrochlorothiazide (PRINZIDE,ZESTORETIC) 20-12.5 MG tablet TAKE 1 TABLET BY MOUTH DAILY 08/04/15   Glean Hess, MD  LYRICA 50 MG capsule Take 2 capsules by mouth Nightly. As needed can take 1 during day also 03/31/15   Historical Provider,  MD  methocarbamol (ROBAXIN) 500 MG tablet Take 1 tablet by mouth 3 (three) times daily.    Historical Provider, MD  oxyCODONE-acetaminophen (PERCOCET) 7.5-325 MG per tablet Take 1 tablet by mouth 3 (three) times daily.    Historical Provider, MD  ranitidine  (ZANTAC) 150 MG tablet Take 1 tablet by mouth 2 (two) times daily.    Historical Provider, MD  SUMAtriptan (IMITREX) 100 MG tablet Take 1 tablet (100 mg total) by mouth daily as needed. 03/21/15   Glean Hess, MD  tiZANidine (ZANAFLEX) 4 MG capsule Take 4 mg by mouth at bedtime as needed for muscle spasms.    Historical Provider, MD    Allergies  Allergen Reactions  . Aspirin Shortness Of Breath  . Hydrocodone Shortness Of Breath  . Bupropion Hcl     intolerance  . Codeine Itching    Other reaction(s): Rash  . Divalproex Sodium Er Nausea And Vomiting  . Duloxetine Hcl     intolerance  . Escitalopram     intolerance  . Morphine Sulfate Itching  . Naproxen     delusions  . Prednisone     delusions  . Ampicillin Rash  . Fentanyl Rash  . Sulfa Antibiotics Rash    Past Surgical History:  Procedure Laterality Date  . BACK SURGERY    . HAND SURGERY Left   . HAND SURGERY    . KNEE ARTHROSCOPY    . LAMINECTOMY  1992  . MENISCUS REPAIR Right 01/2014  . TOTAL ABDOMINAL HYSTERECTOMY  2011    Social History  Substance Use Topics  . Smoking status: Former Research scientist (life sciences)  . Smokeless tobacco: Never Used  . Alcohol use 1.8 oz/week    2 Standard drinks or equivalent, 1 Glasses of wine per week     Medication list has been reviewed and updated.   Physical Exam  Constitutional: She is oriented to person, place, and time. She appears well-developed. No distress.  HENT:  Head: Normocephalic and atraumatic.  Neck: Normal range of motion. No thyromegaly present.  Cardiovascular: Normal rate and normal heart sounds.   Pulmonary/Chest: Effort normal and breath sounds normal. No respiratory distress. She has no wheezes.  Musculoskeletal: Normal range of motion. She exhibits no edema.  Neurological: She is alert and oriented to person, place, and time.  Skin: Skin is warm and dry. No rash noted.  Scattered red macules on chin.  Erythema of cheeks and forehead.  Psychiatric: She has a  normal mood and affect. Her behavior is normal. Thought content normal.  Nursing note and vitals reviewed.   BP 132/82   Pulse 72   Ht 5\' 6"  (1.676 m)   Wt 210 lb (95.3 kg)   BMI 33.89 kg/m   Assessment and Plan: 1. Essential (primary) hypertension Controlled on current regimen  2. Migraine without aura and without status migrainosus, not intractable stable - SUMAtriptan (IMITREX) 100 MG tablet; Take 1 tablet (100 mg total) by mouth daily as needed.  Dispense: 10 tablet; Refill: 3  3. Rosacea Switch to brand name doxycycline and recommend Dermatology follow up - doxycycline (ORACEA) 40 MG capsule; Take 1 capsule (40 mg total) by mouth every morning.  Dispense: 30 capsule; Refill: [place], MD [facility]  05/08/2016

## 2016-06-07 HISTORY — PX: OTHER SURGICAL HISTORY: SHX169

## 2016-08-20 ENCOUNTER — Telehealth: Payer: Self-pay

## 2016-08-20 NOTE — Telephone Encounter (Signed)
Wants incontinence Rx but advised OV

## 2016-08-23 LAB — HM MAMMOGRAPHY

## 2016-09-01 ENCOUNTER — Other Ambulatory Visit: Payer: Self-pay | Admitting: Internal Medicine

## 2016-09-01 ENCOUNTER — Telehealth: Payer: Self-pay

## 2016-09-01 MED ORDER — LISINOPRIL-HYDROCHLOROTHIAZIDE 20-12.5 MG PO TABS: 1.0000 | ORAL_TABLET | Freq: Every day | ORAL | 0 refills | Status: DC

## 2016-09-01 NOTE — Telephone Encounter (Signed)
Done.  Let her know for future reference that she can request prescription transfers.

## 2016-09-01 NOTE — Telephone Encounter (Signed)
Wants 90 day RX HTN [facility]s [facility].  Has appt April 4

## 2016-09-07 ENCOUNTER — Ambulatory Visit (INDEPENDENT_AMBULATORY_CARE_PROVIDER_SITE_OTHER): Payer: BLUE CROSS/BLUE SHIELD | Admitting: Internal Medicine

## 2016-09-07 ENCOUNTER — Encounter: Payer: Self-pay | Admitting: Internal Medicine

## 2016-09-07 VITALS — BP 128/72 | HR 80 | Ht 66.0 in | Wt 208.0 lb

## 2016-09-07 DIAGNOSIS — N3941 Urge incontinence: Secondary | ICD-10-CM

## 2016-09-07 DIAGNOSIS — I10 Essential (primary) hypertension: Secondary | ICD-10-CM

## 2016-09-07 DIAGNOSIS — J4520 Mild intermittent asthma, uncomplicated: Secondary | ICD-10-CM | POA: Diagnosis not present

## 2016-09-07 MED ORDER — SOLIFENACIN SUCCINATE 5 MG PO TABS: 5.0000 mg | ORAL_TABLET | Freq: Every day | ORAL | 5 refills | Status: DC

## 2016-09-07 MED ORDER — ALBUTEROL SULFATE HFA 108 (90 BASE) MCG/ACT IN AERS: 2.0000 | INHALATION_SPRAY | Freq: Four times a day (QID) | RESPIRATORY_TRACT | 5 refills | Status: DC | PRN

## 2016-09-07 NOTE — Progress Notes (Signed)
Date:  09/07/2016   Name:  Alicia Nichols   DOB:  [birth year]   MRN:  [id number]   Chief Complaint: Urinary Incontinence (Been going on for years. Comes in cycles- every 10 mins its urgency to urinate. "Uses it pants a lot." Pt wears poise pad daily. "Sometimes change once a day, and sometimes several times a day."  ) and Hypertension Hypertension  This is a chronic problem. Associated symptoms include shortness of breath (with exertion ). Pertinent negatives include no chest pain or palpitations. Past treatments include ACE inhibitors and diuretics. The current treatment provides significant improvement.   OAB - getting worse as above.  Triggered by full bladder as well as cough, running, sneezing.  No dysuria.  No vaginal pain or discharge.  Has never tried any medication but ready to do so now.  Review of Systems  Constitutional: Negative for chills and fatigue.  Respiratory: Positive for shortness of breath (with exertion ) and wheezing. Negative for chest tightness.   Cardiovascular: Negative for chest pain, palpitations and leg swelling.  Genitourinary: Negative for decreased urine volume, dysuria, genital sores, hematuria and vaginal bleeding.  Musculoskeletal: Positive for arthralgias. Negative for gait problem.  Allergic/Immunologic: Positive for environmental allergies.  Neurological: Negative for dizziness.    Patient Active Problem List   Diagnosis Date Noted  . Dermatitis due to unknown cause 11/04/2015  . ACL (anterior cruciate ligament) tear 10/21/2015  . Rosacea 10/21/2015  . Fibrocystic breast changes 06/06/2015  . Essential (primary) hypertension 12/03/2014  . Abnormal thyroid stimulating hormone (TSH) level 12/03/2014  . Urge incontinence 12/03/2014  . H/O peptic ulcer 12/03/2014  . Myalgia and myositis 12/03/2014  . Acne erythematosa 12/03/2014  . Migraine without aura and without status migrainosus, not intractable 12/03/2014  . Clinical depression 12/03/2014    . Asthma, mild intermittent 12/03/2014  . Hot flash, menopausal 12/03/2014  . Idiopathic insomnia 12/03/2014  . Family history of breast cancer 12/03/2014    Prior to Admission medications   Medication Sig Start Date End Date Taking? Authorizing Provider  Chlorzoxazone (LORZONE) 750 MG TABS Take 1 tablet by mouth daily.   Yes Historical Provider, MD  Clocortolone Pivalate (CLODERM) 0.1 % cream Apply 1 application topically 2 (two) times daily. 11/04/15  Yes Glean Hess, MD  estradiol (VIVELLE-DOT) 0.1 MG/24HR patch Place 1 patch (0.1 mg total) onto the skin 2 (two) times a week. 10/21/15  Yes Glean Hess, MD  lidocaine (XYLOCAINE) 5 % ointment APPLY 2-3 GRAMS (1 GRAM=1 INCH) TO AFFECTED AREA 4 TIMES DAILY 09/05/15  Yes Historical Provider, MD  lisinopril-hydrochlorothiazide (PRINZIDE,ZESTORETIC) 20-12.5 MG tablet Take 1 tablet by mouth daily. 09/01/16  Yes Glean Hess, MD  LYRICA 50 MG capsule Take 2 capsules by mouth Nightly. As needed can take 1 during day also- 100 to 200 mg a day 03/31/15  Yes Historical Provider, MD  methocarbamol (ROBAXIN) 500 MG tablet Take 1 tablet by mouth 3 (three) times daily.   Yes Historical Provider, MD  oxyCODONE-acetaminophen (PERCOCET) 7.5-325 MG per tablet Take 1 tablet by mouth 3 (three) times daily. Pt taking 8 times each day due to surgery   Yes Historical Provider, MD  ranitidine (ZANTAC) 150 MG tablet Take 1 tablet by mouth 2 (two) times daily.   Yes Historical Provider, MD  SUMAtriptan (IMITREX) 100 MG tablet Take 1 tablet (100 mg total) by mouth daily as needed. 05/07/16  Yes Glean Hess, MD  tiZANidine (ZANAFLEX) 4 MG capsule Take 4  mg by mouth at bedtime as needed for muscle spasms.   Yes Historical Provider, MD    Allergies  Allergen Reactions  . Aspirin Shortness Of Breath  . Hydrocodone Shortness Of Breath  . Bupropion Hcl     intolerance  . Codeine Itching    Other reaction(s): Rash  . Divalproex Sodium Er Nausea And  Vomiting  . Duloxetine Hcl     intolerance  . Escitalopram     intolerance  . Morphine Sulfate Itching  . Naproxen     delusions  . Prednisone     delusions  . Ampicillin Rash  . Fentanyl Rash  . Sulfa Antibiotics Rash    Past Surgical History:  Procedure Laterality Date  . BACK SURGERY    . HAND SURGERY Left   . HAND SURGERY    . KNEE ARTHROSCOPY    . LAMINECTOMY  1992  . MENISCUS REPAIR Right 01/2014  . TOTAL ABDOMINAL HYSTERECTOMY  2011    Social History  Substance Use Topics  . Smoking status: Former Research scientist (life sciences)  . Smokeless tobacco: Never Used  . Alcohol use 1.8 oz/week    2 Standard drinks or equivalent, 1 Glasses of wine per week     Medication list has been reviewed and updated.   Physical Exam  Constitutional: She is oriented to person, place, and time. She appears well-developed. No distress.  HENT:  Head: Normocephalic and atraumatic.  Neck: Normal range of motion. Neck supple.  Cardiovascular: Normal rate, regular rhythm and normal heart sounds.   Pulmonary/Chest: Effort normal and breath sounds normal. No respiratory distress. She has no wheezes.  Abdominal: Soft. Bowel sounds are normal.  Musculoskeletal: Normal range of motion.  Healing osteotomy scar left forearm Healing CTR both palms  Neurological: She is alert and oriented to person, place, and time.  Skin: Skin is warm and dry. No rash noted.  Psychiatric: She has a normal mood and affect. Her speech is normal and behavior is normal. Thought content normal.  Nursing note and vitals reviewed.   BP 128/72 (BP Location: Right Arm, Patient Position: Sitting, Cuff Size: Large)   Pulse 80   Ht 5\' 6"  (1.676 m)   Wt 208 lb (94.3 kg)   SpO2 99%   BMI 33.57 kg/m   Assessment and Plan: 1. Essential (primary) hypertension controlled  2. Mild intermittent asthma without complication Continue albuterol PRN  3. Urge incontinence Begin Vesicare   Meds ordered this encounter  Medications  .  solifenacin (VESICARE) 5 MG tablet    Sig: Take 1 tablet (5 mg total) by mouth daily.    Dispense:  30 tablet    Refill:  5  . albuterol (PROVENTIL HFA;VENTOLIN HFA) 108 (90 Base) MCG/ACT inhaler    Sig: Inhale 2 puffs into the lungs every 6 (six) hours as needed for wheezing or shortness of breath.    Dispense:  1 Inhaler    Refill:  [place], MD [facility]  09/07/2016

## 2016-09-08 ENCOUNTER — Telehealth: Payer: Self-pay

## 2016-09-08 ENCOUNTER — Other Ambulatory Visit: Payer: Self-pay | Admitting: Internal Medicine

## 2016-09-08 MED ORDER — OXYBUTYNIN CHLORIDE ER 5 MG PO TB24: 5.0000 mg | ORAL_TABLET | Freq: Every day | ORAL | 0 refills | Status: DC

## 2016-09-08 NOTE — Telephone Encounter (Signed)
Pt called stating went to pharmacy to pick up Vesicare. Was told this medication is $40 a month with insurance. Called her insurance company and was told Generic Oxybutynin would be better for price of $98 for 3 months. Asked to send this to pharmacy instead.

## 2016-09-08 NOTE — Telephone Encounter (Signed)
Oxybutinin XL 5 mg sent to pharmacy #[id number]

## 2016-09-08 NOTE — Telephone Encounter (Signed)
Pt informed

## 2016-09-24 ENCOUNTER — Telehealth: Payer: Self-pay

## 2016-09-24 NOTE — Telephone Encounter (Signed)
She should stop the Vesicare if she thinks it is causing a headache.  It will not make her urine dark or decrease the amount of urine that her kidneys produce.  She should drink more water until her urine is very pale yellow.  If the headache goes away and she wants to try another bladder control medication, she can call back.

## 2016-09-24 NOTE — Telephone Encounter (Signed)
Pt called requesting advice. States since beginning medication for bladder incontinence she has had a non-stop headache for a few days now. She said she is not able to urinate much and her urine is very dark. Wants advice please?

## 2016-09-27 ENCOUNTER — Telehealth: Payer: Self-pay

## 2016-09-27 ENCOUNTER — Other Ambulatory Visit: Payer: Self-pay | Admitting: Internal Medicine

## 2016-09-27 NOTE — Telephone Encounter (Signed)
Pt called back stating she tried Vesicate and headache went away. Urine is now back to normal. - [facility] [place]

## 2016-09-27 NOTE — Telephone Encounter (Signed)
Pt informed- Left VM- Awaiting call back if questions.

## 2016-09-27 NOTE — Telephone Encounter (Signed)
She was not taking Alicia Nichols - I had a request to change her to Ditropan because it was cheaper.  So - what was she actually taking?

## 2016-09-27 NOTE — Telephone Encounter (Signed)
Pt was taking Ditropan- awaiting pt callback for meds are cheaper through pharmacy.

## 2016-09-29 ENCOUNTER — Other Ambulatory Visit: Payer: Self-pay | Admitting: Internal Medicine

## 2016-09-29 MED ORDER — SOLIFENACIN SUCCINATE 5 MG PO TABS: 5.0000 mg | ORAL_TABLET | Freq: Every day | ORAL | 0 refills | Status: DC

## 2016-10-31 ENCOUNTER — Other Ambulatory Visit: Payer: Self-pay | Admitting: Internal Medicine

## 2016-11-02 ENCOUNTER — Telehealth: Payer: Self-pay

## 2016-11-02 MED ORDER — SOLIFENACIN SUCCINATE 5 MG PO TABS: 5.0000 mg | ORAL_TABLET | Freq: Every day | ORAL | 5 refills | Status: DC

## 2016-11-02 NOTE — Telephone Encounter (Signed)
Pt called stating she needs refill on medication. Unsure of which medication. Called her back and she stated Oxybutynin dried eyes and could not take. Lady at [employer] informed pt that Laurin Coder has online coupon and would like refill because its doing well, and she does have current coupon for discount. [facility] [place]. Pt informed.

## 2016-11-09 ENCOUNTER — Ambulatory Visit (INDEPENDENT_AMBULATORY_CARE_PROVIDER_SITE_OTHER): Payer: BLUE CROSS/BLUE SHIELD | Admitting: Internal Medicine

## 2016-11-09 ENCOUNTER — Encounter: Payer: Self-pay | Admitting: Internal Medicine

## 2016-11-09 VITALS — BP 104/70 | HR 76 | Ht 66.0 in | Wt 206.0 lb

## 2016-11-09 DIAGNOSIS — G43009 Migraine without aura, not intractable, without status migrainosus: Secondary | ICD-10-CM

## 2016-11-09 DIAGNOSIS — Z8711 Personal history of peptic ulcer disease: Secondary | ICD-10-CM

## 2016-11-09 DIAGNOSIS — Z0000 Encounter for general adult medical examination without abnormal findings: Secondary | ICD-10-CM

## 2016-11-09 DIAGNOSIS — I10 Essential (primary) hypertension: Secondary | ICD-10-CM

## 2016-11-09 DIAGNOSIS — Z79890 Hormone replacement therapy (postmenopausal): Secondary | ICD-10-CM

## 2016-11-09 DIAGNOSIS — Z1211 Encounter for screening for malignant neoplasm of colon: Secondary | ICD-10-CM | POA: Diagnosis not present

## 2016-11-09 DIAGNOSIS — Z1239 Encounter for other screening for malignant neoplasm of breast: Secondary | ICD-10-CM

## 2016-11-09 LAB — POCT URINALYSIS DIPSTICK
Bilirubin, UA: NEGATIVE
Glucose, UA: NEGATIVE
Ketones, UA: NEGATIVE
NITRITE UA: NEGATIVE
PH UA: 5 (ref 5.0–8.0)
PROTEIN UA: NEGATIVE
RBC UA: NEGATIVE
Spec Grav, UA: 1.01 (ref 1.010–1.025)
UROBILINOGEN UA: 0.2 U/dL

## 2016-11-09 MED ORDER — ESTRADIOL 0.05 MG/24HR TD PTTW: 1.0000 | MEDICATED_PATCH | TRANSDERMAL | 12 refills | Status: DC

## 2016-11-09 MED ORDER — LISINOPRIL-HYDROCHLOROTHIAZIDE 20-12.5 MG PO TABS: 1.0000 | ORAL_TABLET | Freq: Every day | ORAL | 3 refills | Status: DC

## 2016-11-09 NOTE — Progress Notes (Signed)
Date:  11/09/2016   Name:  Alicia Nichols   DOB:  12-Feb-1961   MRN:  [id number]   Chief Complaint: Annual Exam (Breast Exam- No Pap. ); Hypertension; and Gastroesophageal Reflux Alicia Nichols is a 56 y.o. female who presents today for her Complete Annual Exam. She feels fairly well. She reports exercising none. She reports she is sleeping fairly well. She is still recovering from her left arm surgery and now has tendonitis.  Mammogram was done at [facility] in March but she is having breast tenderness from HRT patches. She has never had a colonoscopy - tried to schedule several times with [facility] but kept being cancelled.  Hypertension  This is a chronic problem. The problem is controlled. Associated symptoms include headaches. Pertinent negatives include no chest pain, palpitations or shortness of breath. Past treatments include ACE inhibitors and diuretics.  Gastroesophageal Reflux  She complains of heartburn. She reports no abdominal pain, no chest pain, no coughing, no dysphagia, no hoarse voice, no sore throat or no wheezing. This is a chronic problem. The problem occurs rarely. Pertinent negatives include no fatigue. She has tried a histamine-2 antagonist for the symptoms. The treatment provided significant relief.  Migraine   This is a chronic problem. The problem occurs intermittently. Pertinent negatives include no abdominal pain, coughing, dizziness, fever, hearing loss, sore throat, tinnitus or vomiting. Her past medical history is significant for hypertension.  Asthma  There is no cough, hoarse voice, shortness of breath or wheezing. This is a chronic problem. The problem has been waxing and waning. Associated symptoms include headaches, heartburn and myalgias. Pertinent negatives include no chest pain, fever, sore throat or trouble swallowing. Her past medical history is significant for asthma.     Review of Systems  Constitutional: Negative for chills, fatigue and fever.  HENT: Negative for  congestion, hearing loss, hoarse voice, sore throat, tinnitus, trouble swallowing and voice change.   Eyes: Negative for visual disturbance.  Respiratory: Negative for cough, chest tightness, shortness of breath and wheezing.   Cardiovascular: Negative for chest pain, palpitations and leg swelling.  Gastrointestinal: Positive for heartburn. Negative for abdominal pain, constipation, diarrhea, dysphagia and vomiting.  Endocrine: Negative for polydipsia and polyuria.  Genitourinary: Negative for dysuria, frequency, genital sores, vaginal bleeding and vaginal discharge.       Breast tenderness - mammogram in march was normal  Musculoskeletal: Positive for arthralgias, joint swelling (elbow and knee) and myalgias. Negative for gait problem.  Skin: Negative for color change and rash.  Neurological: Positive for headaches. Negative for dizziness, tremors and light-headedness.  Hematological: Negative for adenopathy. Does not bruise/bleed easily.  Psychiatric/Behavioral: Negative for dysphoric mood and sleep disturbance. The patient is not nervous/anxious.     Patient Active Problem List   Diagnosis Date Noted  . Dermatitis due to unknown cause 11/04/2015  . ACL (anterior cruciate ligament) tear 10/21/2015  . Rosacea 10/21/2015  . Fibrocystic breast changes 06/06/2015  . Essential (primary) hypertension 12/03/2014  . Abnormal thyroid stimulating hormone (TSH) level 12/03/2014  . Urge incontinence 12/03/2014  . H/O peptic ulcer 12/03/2014  . Myalgia and myositis 12/03/2014  . Acne erythematosa 12/03/2014  . Migraine without aura and without status migrainosus, not intractable 12/03/2014  . Clinical depression 12/03/2014  . Mild intermittent asthma without complication 75/91/6384  . Hot flash, menopausal 12/03/2014  . Idiopathic insomnia 12/03/2014  . Family history of breast cancer 12/03/2014    Prior to Admission medications   Medication Sig Start Date End  Date Taking? Authorizing  Provider  albuterol (PROVENTIL HFA;VENTOLIN HFA) 108 (90 Base) MCG/ACT inhaler Inhale 2 puffs into the lungs every 6 (six) hours as needed for wheezing or shortness of breath. 09/07/16  Yes Glean Hess, MD  Chlorzoxazone (LORZONE) 750 MG TABS Take 1 tablet by mouth daily.   Yes [provider]  Clocortolone Pivalate (CLODERM) 0.1 % cream Apply 1 application topically 2 (two) times daily. 11/04/15  Yes Glean Hess, MD  Doxepin HCl 5 % CREA Apply topically.   Yes [provider]  estradiol (VIVELLE-DOT) 0.1 MG/24HR patch Place 1 patch (0.1 mg total) onto the skin 2 (two) times a week. 10/21/15  Yes Glean Hess, MD  lidocaine (XYLOCAINE) 5 % ointment APPLY 2-3 GRAMS (1 GRAM=1 INCH) TO AFFECTED AREA 4 TIMES DAILY 09/05/15  Yes [provider]  lisinopril-hydrochlorothiazide (PRINZIDE,ZESTORETIC) 20-12.5 MG tablet Take 1 tablet by mouth daily. 09/01/16  Yes Glean Hess, MD  LYRICA 50 MG capsule Take 2 capsules by mouth Nightly. As needed can take 1 during day also- 100 to 200 mg a day 03/31/15  Yes [provider]  methocarbamol (ROBAXIN) 500 MG tablet Take 1 tablet by mouth 3 (three) times daily.   Yes [provider]  NONFORMULARY OR COMPOUNDED ITEM Apply 1 application topically 4 (four) times daily.   Yes [provider]  oxyCODONE-acetaminophen (PERCOCET) 7.5-325 MG per tablet Take 1 tablet by mouth 3 (three) times daily. Pt taking 8 times each day due to surgery   Yes [provider]  ranitidine (ZANTAC) 150 MG tablet Take 1 tablet by mouth 2 (two) times daily.   Yes [provider]  solifenacin (VESICARE) 5 MG tablet Take 1 tablet (5 mg total) by mouth daily. 11/02/16  Yes Glean Hess, MD  SUMAtriptan (IMITREX) 100 MG tablet Take 1 tablet (100 mg total) by mouth daily as needed. 05/07/16  Yes Glean Hess, MD  tiZANidine (ZANAFLEX) 4 MG capsule Take 4 mg by mouth at bedtime as needed for muscle spasms.    Yes [provider]    Allergies  Allergen Reactions  . Aspirin Shortness Of Breath  . Hydrocodone Shortness Of Breath  . Bupropion Hcl     intolerance  . Codeine Itching    Other reaction(s): Rash  . Divalproex Sodium Er Nausea And Vomiting  . Duloxetine Hcl     intolerance  . Escitalopram     intolerance  . Morphine Sulfate Itching  . Naproxen     delusions  . Prednisone     delusions  . Ampicillin Rash  . Fentanyl Rash  . Oxybutynin Other (See Comments)    Headache  . Sulfa Antibiotics Rash    Past Surgical History:  Procedure Laterality Date  . BACK SURGERY    . HAND SURGERY Left   . HAND SURGERY    . KNEE ARTHROSCOPY    . LAMINECTOMY  1992  . MENISCUS REPAIR Right 01/2014  . TOTAL ABDOMINAL HYSTERECTOMY  2011    Social History  Substance Use Topics  . Smoking status: Former Research scientist (life sciences)  . Smokeless tobacco: Never Used  . Alcohol use 1.8 oz/week    2 Standard drinks or equivalent, 1 Glasses of wine per week     Medication list has been reviewed and updated.   Physical Exam  Constitutional: She is oriented to person, place, and time. She appears well-developed and well-nourished. No distress.  HENT:  Head: Normocephalic and atraumatic.  Right Ear:  Tympanic membrane and ear canal normal.  Left Ear: Tympanic membrane and ear canal normal.  Nose: Right sinus exhibits no maxillary sinus tenderness. Left sinus exhibits no maxillary sinus tenderness.  Mouth/Throat: Uvula is midline and oropharynx is clear and moist.  Eyes: Conjunctivae and EOM are normal. Right eye exhibits no discharge. Left eye exhibits no discharge. No scleral icterus.  Neck: Normal range of motion. Carotid bruit is not present. No erythema present. No thyromegaly present.  Cardiovascular: Normal rate, regular rhythm, normal heart sounds and normal pulses.   Pulmonary/Chest: Effort normal. No respiratory distress. She has no wheezes. Right breast exhibits tenderness. Right breast  exhibits no mass, no nipple discharge and no skin change. Left breast exhibits tenderness. Left breast exhibits no mass, no nipple discharge and no skin change.  Abdominal: Soft. Bowel sounds are normal. There is no hepatosplenomegaly. There is no tenderness. There is no CVA tenderness.  Musculoskeletal:       Left wrist: She exhibits decreased range of motion and tenderness.  Lymphadenopathy:    She has no cervical adenopathy.    She has no axillary adenopathy.  Neurological: She is alert and oriented to person, place, and time. She has normal reflexes. No cranial nerve deficit or sensory deficit.  Skin: Skin is warm, dry and intact. No rash noted.  Scattered capillary hemangiomas  Psychiatric: She has a normal mood and affect. Her speech is normal and behavior is normal. Thought content normal.  Nursing note and vitals reviewed.   BP 104/70 (BP Location: Right Arm, Patient Position: Sitting, Cuff Size: Large)   Pulse 76   Ht 5\' 6"  (1.676 m)   Wt 206 lb (93.4 kg)   SpO2 99%   BMI 33.25 kg/m   Assessment and Plan: 1. Annual physical exam Up to date Continue follow up with Orthopedics - Lipid panel - POCT urinalysis dipstick  2. Breast cancer screening Done at [facility] in March  3. Essential (primary) hypertension controlled - Comprehensive metabolic panel - TSH  4. H/O peptic ulcer stable - CBC with Differential/Platelet  5. Migraine without aura and without status migrainosus, not intractable Controlled with decreased episodes  6. Colon cancer screening - Ambulatory referral to Gastroenterology  7. Hormone replacement therapy (HRT) With breast tenderness - reduce dose of estrogen - estradiol (VIVELLE-DOT) 0.05 MG/24HR patch; Place 1 patch (0.05 mg total) onto the skin 2 (two) times a week.  Dispense: 8 patch; Refill: 12   Meds ordered this encounter  Medications  . estradiol (VIVELLE-DOT) 0.05 MG/24HR patch    Sig: Place 1 patch (0.05 mg total) onto the skin 2 (two)  times a week.    Dispense:  8 patch    Refill:  12  . lisinopril-hydrochlorothiazide (PRINZIDE,ZESTORETIC) 20-12.5 MG tablet    Sig: Take 1 tablet by mouth daily.    Dispense:  90 tablet    Refill:  [place], MD [facility]  11/09/2016

## 2016-11-09 NOTE — Patient Instructions (Signed)

## 2016-11-10 ENCOUNTER — Encounter: Payer: Self-pay | Admitting: Internal Medicine

## 2016-11-10 DIAGNOSIS — E782 Mixed hyperlipidemia: Secondary | ICD-10-CM | POA: Insufficient documentation

## 2016-11-10 DIAGNOSIS — E785 Hyperlipidemia, unspecified: Secondary | ICD-10-CM | POA: Insufficient documentation

## 2016-11-10 LAB — CBC WITH DIFFERENTIAL/PLATELET
BASOS ABS: 0 10*3/uL (ref 0.0–0.2)
BASOS: 1 %
EOS (ABSOLUTE): 0.1 10*3/uL (ref 0.0–0.4)
Eos: 2 %
HEMOGLOBIN: 12.3 g/dL (ref 11.1–15.9)
Hematocrit: 37.5 % (ref 34.0–46.6)
IMMATURE GRANS (ABS): 0 10*3/uL (ref 0.0–0.1)
IMMATURE GRANULOCYTES: 0 %
LYMPHS: 34 %
Lymphocytes Absolute: 1.3 10*3/uL (ref 0.7–3.1)
MCH: 28.6 pg (ref 26.6–33.0)
MCHC: 32.8 g/dL (ref 31.5–35.7)
MCV: 87 fL (ref 79–97)
MONOCYTES: 11 %
Monocytes Absolute: 0.4 10*3/uL (ref 0.1–0.9)
NEUTROS PCT: 52 %
Neutrophils Absolute: 2.1 10*3/uL (ref 1.4–7.0)
PLATELETS: 332 10*3/uL (ref 150–379)
RBC: 4.3 x10E6/uL (ref 3.77–5.28)
RDW: 13.3 % (ref 12.3–15.4)
WBC: 3.9 10*3/uL (ref 3.4–10.8)

## 2016-11-10 LAB — COMPREHENSIVE METABOLIC PANEL
ALBUMIN: 4.5 g/dL (ref 3.5–5.5)
ALT: 10 IU/L (ref 0–32)
AST: 12 IU/L (ref 0–40)
Albumin/Globulin Ratio: 1.6 (ref 1.2–2.2)
Alkaline Phosphatase: 66 IU/L (ref 39–117)
BUN / CREAT RATIO: 19 (ref 9–23)
BUN: 14 mg/dL (ref 6–24)
Bilirubin Total: 0.3 mg/dL (ref 0.0–1.2)
CALCIUM: 9.8 mg/dL (ref 8.7–10.2)
CO2: 25 mmol/L (ref 18–29)
CREATININE: 0.75 mg/dL (ref 0.57–1.00)
Chloride: 99 mmol/L (ref 96–106)
GFR, EST AFRICAN AMERICAN: 105 mL/min/{1.73_m2} (ref >59)
GFR, EST NON AFRICAN AMERICAN: 91 mL/min/{1.73_m2} (ref >59)
Globulin, Total: 2.8 g/dL (ref 1.5–4.5)
Glucose: 81 mg/dL (ref 65–99)
Potassium: 3.9 mmol/L (ref 3.5–5.2)
Sodium: 140 mmol/L (ref 134–144)
TOTAL PROTEIN: 7.3 g/dL (ref 6.0–8.5)

## 2016-11-10 LAB — LIPID PANEL
CHOL/HDL RATIO: 4.2 ratio (ref 0.0–4.4)
Cholesterol, Total: 240 mg/dL — ABNORMAL HIGH (ref 100–199)
HDL: 57 mg/dL (ref >39)
LDL CALC: 150 mg/dL — AB (ref 0–99)
Triglycerides: 167 mg/dL — ABNORMAL HIGH (ref 0–149)
VLDL CHOLESTEROL CAL: 33 mg/dL (ref 5–40)

## 2016-11-10 LAB — TSH: TSH: 2.12 u[IU]/mL (ref 0.450–4.500)

## 2016-11-23 ENCOUNTER — Telehealth: Payer: Self-pay

## 2016-11-23 ENCOUNTER — Other Ambulatory Visit: Payer: Self-pay | Admitting: Internal Medicine

## 2016-11-23 MED ORDER — SOLIFENACIN SUCCINATE 10 MG PO TABS: 10.0000 mg | ORAL_TABLET | Freq: Every day | ORAL | 5 refills | Status: DC

## 2016-11-23 NOTE — Telephone Encounter (Signed)
I sent in a new Rx for Vesicare 10 mg.

## 2016-11-23 NOTE — Telephone Encounter (Signed)
Pt called stating urgency is back and having leaking again. Wondering if can up dosage on Vesicare to help with this?

## 2016-11-23 NOTE — Telephone Encounter (Signed)
Pt informed of new rx sent.

## 2016-12-01 ENCOUNTER — Telehealth: Payer: Self-pay

## 2016-12-01 ENCOUNTER — Other Ambulatory Visit: Payer: Self-pay | Admitting: Internal Medicine

## 2016-12-01 DIAGNOSIS — G43009 Migraine without aura, not intractable, without status migrainosus: Secondary | ICD-10-CM

## 2016-12-01 MED ORDER — SUMATRIPTAN SUCCINATE 100 MG PO TABS: 100.0000 mg | ORAL_TABLET | Freq: Every day | ORAL | 3 refills | Status: DC | PRN

## 2016-12-01 NOTE — Telephone Encounter (Signed)
Left message requesting refill for Sumitriptan send to [facility].

## 2016-12-03 ENCOUNTER — Other Ambulatory Visit: Payer: Self-pay

## 2016-12-03 ENCOUNTER — Telehealth: Payer: Self-pay

## 2016-12-03 DIAGNOSIS — Z1211 Encounter for screening for malignant neoplasm of colon: Secondary | ICD-10-CM

## 2016-12-03 DIAGNOSIS — Z1212 Encounter for screening for malignant neoplasm of rectum: Principal | ICD-10-CM

## 2016-12-03 NOTE — Telephone Encounter (Signed)
Gastroenterology Pre-Procedure Review  Request Date: 7/11 Requesting Physician: Dr. Vicente Males  PATIENT REVIEW QUESTIONS: The patient responded to the following health history questions as indicated:    1. Are you having any GI issues? no 2. Do you have a personal history of Polyps? no 3. Do you have a family history of Colon Cancer or Polyps? no 4. Diabetes Mellitus? no 5. Joint replacements in the past 12 months?no 6. Major health problems in the past 3 months?no 7. Any artificial heart valves, MVP, or defibrillator?no    MEDICATIONS & ALLERGIES:    Patient reports the following regarding taking any anticoagulation/antiplatelet therapy:   Plavix, Coumadin, Eliquis, Xarelto, Lovenox, Pradaxa, Brilinta, or Effient? no Aspirin? no  Patient confirms/reports the following medications:  Current Outpatient Prescriptions  Medication Sig Dispense Refill  . albuterol (PROVENTIL HFA;VENTOLIN HFA) 108 (90 Base) MCG/ACT inhaler Inhale 2 puffs into the lungs every 6 (six) hours as needed for wheezing or shortness of breath. 1 Inhaler 5  . Chlorzoxazone (LORZONE) 750 MG TABS Take 1 tablet by mouth daily.    Marland Kitchen Clocortolone Pivalate (CLODERM) 0.1 % cream Apply 1 application topically 2 (two) times daily. 90 g 1  . Doxepin HCl 5 % CREA Apply topically.    Marland Kitchen estradiol (VIVELLE-DOT) 0.05 MG/24HR patch Place 1 patch (0.05 mg total) onto the skin 2 (two) times a week. 8 patch 12  . lidocaine (XYLOCAINE) 5 % ointment APPLY 2-3 GRAMS (1 GRAM=1 INCH) TO AFFECTED AREA 4 TIMES DAILY  1  . lisinopril-hydrochlorothiazide (PRINZIDE,ZESTORETIC) 20-12.5 MG tablet Take 1 tablet by mouth daily. 90 tablet 3  . LYRICA 50 MG capsule Take 2 capsules by mouth Nightly. As needed can take 1 during day also- 100 to 200 mg a day  0  . methocarbamol (ROBAXIN) 500 MG tablet Take 1 tablet by mouth 3 (three) times daily.    . NONFORMULARY OR COMPOUNDED ITEM Apply 1 application topically 4 (four) times daily.    Marland Kitchen  oxyCODONE-acetaminophen (PERCOCET) 7.5-325 MG per tablet Take 1 tablet by mouth 3 (three) times daily. Pt taking 8 times each day due to surgery    . ranitidine (ZANTAC) 150 MG tablet Take 1 tablet by mouth 2 (two) times daily.    . solifenacin (VESICARE) 10 MG tablet Take 1 tablet (10 mg total) by mouth daily. 30 tablet 5  . SUMAtriptan (IMITREX) 100 MG tablet Take 1 tablet (100 mg total) by mouth daily as needed. 10 tablet 3  . tiZANidine (ZANAFLEX) 4 MG capsule Take 4 mg by mouth at bedtime as needed for muscle spasms.     No current facility-administered medications for this visit.     Patient confirms/reports the following allergies:  Allergies  Allergen Reactions  . Aspirin Shortness Of Breath  . Hydrocodone Shortness Of Breath  . Bupropion Hcl     intolerance  . Codeine Itching    Other reaction(s): Rash  . Divalproex Sodium Er Nausea And Vomiting  . Duloxetine Hcl     intolerance  . Escitalopram     intolerance  . Morphine Sulfate Itching  . Naproxen     delusions  . Prednisone     delusions  . Ampicillin Rash  . Fentanyl Rash  . Oxybutynin Other (See Comments)    Headache  . Sulfa Antibiotics Rash    No orders of the defined types were placed in this encounter.   AUTHORIZATION INFORMATION Primary Insurance: 1D#: Group #:  Secondary Insurance: 1D#: Group #:  SCHEDULE INFORMATION: Date:  7/11 Time: Location: [place]

## 2016-12-10 ENCOUNTER — Telehealth: Payer: Self-pay

## 2016-12-10 NOTE — Telephone Encounter (Signed)
Patient called leaving Vm stating that she is now taking Vesicare 10 mg and its like "my urine is falling out of me". LVMTCB- Will need to go back to 5 mg of Vesicare & be sent urology for evaluation. - awaiting call back to inform pt.

## 2016-12-13 ENCOUNTER — Other Ambulatory Visit: Payer: Self-pay

## 2016-12-13 DIAGNOSIS — Z1211 Encounter for screening for malignant neoplasm of colon: Secondary | ICD-10-CM

## 2016-12-13 MED ORDER — NA SULFATE-K SULFATE-MG SULF 17.5-3.13-1.6 GM/177ML PO SOLN: 1.0000 | Freq: Once | ORAL | 0 refills | Status: AC

## 2016-12-15 NOTE — Discharge Instructions (Signed)
General Anesthesia, Adult, Care After °These instructions provide you with information about caring for yourself after your procedure. Your health care provider may also give you more specific instructions. Your treatment has been planned according to current medical practices, but problems sometimes occur. Call your health care provider if you have any problems or questions after your procedure. °What can I expect after the procedure? °After the procedure, it is common to have: °· Vomiting. °· A sore throat. °· Mental slowness. ° °It is common to feel: °· Nauseous. °· Cold or shivery. °· Sleepy. °· Tired. °· Sore or achy, even in parts of your body where you did not have surgery. ° °Follow these instructions at home: °For at least 24 hours after the procedure: °· Do not: °? Participate in activities where you could fall or become injured. °? Drive. °? Use heavy machinery. °? Drink alcohol. °? Take sleeping pills or medicines that cause drowsiness. °? Make important decisions or sign legal documents. °? Take care of children on your own. °· Rest. °Eating and drinking °· If you vomit, drink water, juice, or soup when you can drink without vomiting. °· Drink enough fluid to keep your urine clear or pale yellow. °· Make sure you have little or no nausea before eating solid foods. °· Follow the diet recommended by your health care provider. °General instructions °· Have a responsible adult stay with you until you are awake and alert. °· Return to your normal activities as told by your health care provider. Ask your health care provider what activities are safe for you. °· Take over-the-counter and prescription medicines only as told by your health care provider. °· If you smoke, do not smoke without supervision. °· Keep all follow-up visits as told by your health care provider. This is important. °Contact a health care provider if: °· You continue to have nausea or vomiting at home, and medicines are not helpful. °· You  cannot drink fluids or start eating again. °· You cannot urinate after 8-12 hours. °· You develop a skin rash. °· You have fever. °· You have increasing redness at the site of your procedure. °Get help right away if: °· You have difficulty breathing. °· You have chest pain. °· You have unexpected bleeding. °· You feel that you are having a life-threatening or urgent problem. °This information is not intended to replace advice given to you by your health care provider. Make sure you discuss any questions you have with your health care provider. °Document Released: 08/30/2000 Document Revised: 10/27/2015 Document Reviewed: 05/08/2015 °Elsevier Interactive Patient Education © 2018 [employer] ° °

## 2016-12-16 ENCOUNTER — Ambulatory Visit: Payer: BLUE CROSS/BLUE SHIELD | Admitting: Anesthesiology

## 2016-12-16 ENCOUNTER — Encounter
Admission: RE | Disposition: A | Discharge status: Home / Self Care | Payer: Self-pay | Source: Ambulatory Visit | Attending: Gastroenterology

## 2016-12-16 ENCOUNTER — Ambulatory Visit
Admission: RE | Admit: 2016-12-16 | Discharge: 2016-12-16 | Disposition: A | Discharge status: Home / Self Care | Payer: BLUE CROSS/BLUE SHIELD | Source: Ambulatory Visit | Attending: Gastroenterology | Admitting: Gastroenterology

## 2016-12-16 DIAGNOSIS — Z1212 Encounter for screening for malignant neoplasm of rectum: Secondary | ICD-10-CM | POA: Diagnosis not present

## 2016-12-16 DIAGNOSIS — Z87891 Personal history of nicotine dependence: Secondary | ICD-10-CM | POA: Insufficient documentation

## 2016-12-16 DIAGNOSIS — G709 Myoneural disorder, unspecified: Secondary | ICD-10-CM | POA: Insufficient documentation

## 2016-12-16 DIAGNOSIS — M797 Fibromyalgia: Secondary | ICD-10-CM | POA: Diagnosis not present

## 2016-12-16 DIAGNOSIS — M1990 Unspecified osteoarthritis, unspecified site: Secondary | ICD-10-CM | POA: Diagnosis not present

## 2016-12-16 DIAGNOSIS — J45909 Unspecified asthma, uncomplicated: Secondary | ICD-10-CM | POA: Diagnosis not present

## 2016-12-16 DIAGNOSIS — I10 Essential (primary) hypertension: Secondary | ICD-10-CM | POA: Diagnosis not present

## 2016-12-16 DIAGNOSIS — Z1211 Encounter for screening for malignant neoplasm of colon: Secondary | ICD-10-CM

## 2016-12-16 DIAGNOSIS — D125 Benign neoplasm of sigmoid colon: Secondary | ICD-10-CM | POA: Insufficient documentation

## 2016-12-16 DIAGNOSIS — Z79899 Other long term (current) drug therapy: Secondary | ICD-10-CM | POA: Insufficient documentation

## 2016-12-16 DIAGNOSIS — K635 Polyp of colon: Secondary | ICD-10-CM

## 2016-12-16 HISTORY — DX: Family history of other specified conditions: Z84.89

## 2016-12-16 HISTORY — PX: POLYPECTOMY: SHX149

## 2016-12-16 HISTORY — PX: COLONOSCOPY WITH PROPOFOL: SHX5780

## 2016-12-16 SURGERY — COLONOSCOPY WITH PROPOFOL
Anesthesia: General | Wound class: Contaminated

## 2016-12-16 MED ORDER — ACETAMINOPHEN 325 MG PO TABS: 325.0000 mg | ORAL_TABLET | ORAL | Status: DC | PRN

## 2016-12-16 MED ORDER — LIDOCAINE HCL (CARDIAC) 20 MG/ML IV SOLN
INTRAVENOUS | Status: DC | PRN
  Administered 2016-12-16: 50 mg via INTRAVENOUS

## 2016-12-16 MED ORDER — SODIUM CHLORIDE 0.9 % IV SOLN: INTRAVENOUS | Status: DC

## 2016-12-16 MED ORDER — ACETAMINOPHEN 160 MG/5ML PO SOLN: 325.0000 mg | ORAL | Status: DC | PRN

## 2016-12-16 MED ORDER — PROPOFOL 10 MG/ML IV BOLUS
INTRAVENOUS | Status: DC | PRN
  Administered 2016-12-16 (×2): 50 mg via INTRAVENOUS
  Administered 2016-12-16: 100 mg via INTRAVENOUS
  Administered 2016-12-16: 50 mg via INTRAVENOUS
  Administered 2016-12-16: 100 mg via INTRAVENOUS
  Administered 2016-12-16 (×2): 50 mg via INTRAVENOUS

## 2016-12-16 MED ORDER — LACTATED RINGERS IV SOLN
INTRAVENOUS | Status: DC
  Administered 2016-12-16: 11:00:00 via INTRAVENOUS

## 2016-12-16 SURGICAL SUPPLY — 23 items
CANISTER SUCT 1200ML W/VALVE (MISCELLANEOUS) ×4 IMPLANT
CLIP HMST 235XBRD CATH ROT (MISCELLANEOUS) IMPLANT
CLIP RESOLUTION 360 11X235 (MISCELLANEOUS)
FCP ESCP3.2XJMB 240X2.8X (MISCELLANEOUS) ×2
FORCEPS BIOP RAD 4 LRG CAP 4 (CUTTING FORCEPS) IMPLANT
FORCEPS BIOP RJ4 240 W/NDL (MISCELLANEOUS) ×2
FORCEPS ESCP3.2XJMB 240X2.8X (MISCELLANEOUS) ×2 IMPLANT
GOWN CVR UNV OPN BCK APRN NK (MISCELLANEOUS) ×4 IMPLANT
GOWN ISOL THUMB LOOP REG UNIV (MISCELLANEOUS) ×4
INJECTOR VARIJECT VIN23 (MISCELLANEOUS) IMPLANT
KIT DEFENDO VALVE AND CONN (KITS) IMPLANT
KIT ENDO PROCEDURE OLY (KITS) ×4 IMPLANT
MARKER SPOT ENDO TATTOO 5ML (MISCELLANEOUS) IMPLANT
PAD GROUND ADULT SPLIT (MISCELLANEOUS) IMPLANT
PROBE APC STR FIRE (PROBE) IMPLANT
RETRIEVER NET ROTH 2.5X230 LF (MISCELLANEOUS) IMPLANT
SNARE SHORT THROW 13M SML OVAL (MISCELLANEOUS) IMPLANT
SNARE SHORT THROW 30M LRG OVAL (MISCELLANEOUS) IMPLANT
SNARE SNG USE RND 15MM (INSTRUMENTS) IMPLANT
SPOT EX ENDOSCOPIC TATTOO (MISCELLANEOUS)
TRAP ETRAP POLY (MISCELLANEOUS) IMPLANT
VARIJECT INJECTOR VIN23 (MISCELLANEOUS)
WATER STERILE IRR 250ML POUR (IV SOLUTION) ×4 IMPLANT

## 2016-12-16 NOTE — Op Note (Signed)
[facility] Gastroenterology Patient Name: Alicia Nichols Procedure Date: 12/16/2016 12:18 PM MRN: [id number] Account #: [id number] Date of Birth: May 14, 1961 Admit Type: Outpatient Age: 56 Room: [facility] OR ROOM 01 Gender: Female Note Status: Finalized Procedure:            Colonoscopy Indications:          Screening for colorectal malignant neoplasm Providers:            Lucilla Lame MD, MD Referring MD:         Halina Maidens, MD (Referring MD) Medicines:            Propofol per Anesthesia Complications:        No immediate complications. Procedure:            Pre-Anesthesia Assessment:                       - Prior to the procedure, a History and Physical was                        performed, and patient medications and allergies were                        reviewed. The patient's tolerance of previous                        anesthesia was also reviewed. The risks and benefits of                        the procedure and the sedation options and risks were                        discussed with the patient. All questions were                        answered, and informed consent was obtained. Prior                        Anticoagulants: The patient has taken no previous                        anticoagulant or antiplatelet agents. ASA Grade                        Assessment: II - A patient with mild systemic disease.                        After reviewing the risks and benefits, the patient was                        deemed in satisfactory condition to undergo the                        procedure.                       After obtaining informed consent, the colonoscope was                        passed under direct vision. Throughout the procedure,  the patient's blood pressure, pulse, and oxygen                        saturations were monitored continuously. The Olympus                        Colonoscope [id number]) was introduced through the                       anus and advanced to the the cecum, identified by                        appendiceal orifice and ileocecal valve. The                        colonoscopy was performed without difficulty. The                        patient tolerated the procedure well. The quality of                        the bowel preparation was excellent. Findings:      The perianal and digital rectal examinations were normal.      Three sessile polyps were found in the sigmoid colon. The polyps were 2       to 3 mm in size. These polyps were removed with a cold biopsy forceps.       Resection and retrieval were complete. Impression:           - Three 2 to 3 mm polyps in the sigmoid colon, removed                        with a cold biopsy forceps. Resected and retrieved. Recommendation:       - Discharge patient to home.                       - Resume previous diet.                       - Await pathology results.                       - Repeat colonoscopy in 5 years if polyp adenoma and 10                        years if hyperplastic Procedure Code(s):    --- Professional ---                       [phone], Colonoscopy, flexible; with biopsy, single or                        multiple Diagnosis Code(s):    --- Professional ---                       Z12.11, Encounter for screening for malignant neoplasm                        of colon                       D12.5,  Benign neoplasm of sigmoid colon CPT copyright 2016 American [employer]. All rights reserved. The codes documented in this report are preliminary and upon coder review may  be revised to meet current compliance requirements. Lucilla Lame MD, MD 12/16/2016 12:51:30 PM This report has been signed electronically. Number of Addenda: 0 Note Initiated On: 12/16/2016 12:18 PM Scope Withdrawal Time: 0 hours 7 minutes 19 seconds  Total Procedure Duration: 0 hours 15 minutes 8 seconds       [facility]

## 2016-12-16 NOTE — Anesthesia Postprocedure Evaluation (Signed)
Anesthesia Post Note  Patient: Alicia Nichols  Procedure(s) Performed: Procedure(s) (LRB): COLONOSCOPY WITH PROPOFOL (N/A) POLYPECTOMY INTESTINAL  Patient location during evaluation: PACU Anesthesia Type: General Level of consciousness: awake and alert and oriented Pain management: satisfactory to patient Vital Signs Assessment: post-procedure vital signs reviewed and stable Respiratory status: spontaneous breathing, nonlabored ventilation and respiratory function stable Cardiovascular status: blood pressure returned to baseline and stable Postop Assessment: Adequate PO intake and No signs of nausea or vomiting Anesthetic complications: no    Raliegh Ip

## 2016-12-16 NOTE — H&P (Signed)
Lucilla Lame, MD [facility] [address], [place], [place] [address] Phone: [phone] Fax : [phone]  Primary Care Physician:  Glean Hess, MD Primary Gastroenterologist:  Dr. Allen Norris  Pre-Procedure History & Physical: HPI:  Alicia Nichols is a 56 y.o. female is here for a screening colonoscopy.   Past Medical History:  Diagnosis Date  . Anemia   . Family history of adverse reaction to anesthesia    sister awareness under anesthesia  . Fibromyalgia   . Headache   . Hypertension   . Osteoarthritis     Past Surgical History:  Procedure Laterality Date  . BACK SURGERY    . HAND SURGERY Left   . HAND SURGERY    . KNEE ARTHROSCOPY    . LAMINECTOMY  1992  . MENISCUS REPAIR Right 01/2014  . TOTAL ABDOMINAL HYSTERECTOMY  2011    Prior to Admission medications   Medication Sig Start Date End Date Taking? Authorizing Provider  albuterol (PROVENTIL HFA;VENTOLIN HFA) 108 (90 Base) MCG/ACT inhaler Inhale 2 puffs into the lungs every 6 (six) hours as needed for wheezing or shortness of breath. 09/07/16  Yes Glean Hess, MD  Doxepin HCl 5 % CREA Apply topically.   Yes [provider]  estradiol (VIVELLE-DOT) 0.05 MG/24HR patch Place 1 patch (0.05 mg total) onto the skin 2 (two) times a week. 11/11/16  Yes Glean Hess, MD  lidocaine (XYLOCAINE) 5 % ointment APPLY 2-3 GRAMS (1 GRAM=1 INCH) TO AFFECTED AREA 4 TIMES DAILY 09/05/15  Yes [provider]  lisinopril-hydrochlorothiazide (PRINZIDE,ZESTORETIC) 20-12.5 MG tablet Take 1 tablet by mouth daily. 11/09/16  Yes Glean Hess, MD  LYRICA 50 MG capsule Take 2 capsules by mouth Nightly. As needed can take 1 during day also- 100 to 200 mg a day 03/31/15  Yes [provider]  NONFORMULARY OR COMPOUNDED ITEM Apply 1 application topically 4 (four) times daily.   Yes [provider]  oxyCODONE-acetaminophen (PERCOCET) 7.5-325 MG per tablet Take 1 tablet by mouth 3 (three) times daily. Pt  taking 8 times each day due to surgery   Yes [provider]  ranitidine (ZANTAC) 150 MG tablet Take 1 tablet by mouth 2 (two) times daily.   Yes [provider]  solifenacin (VESICARE) 10 MG tablet Take 1 tablet (10 mg total) by mouth daily. 11/23/16  Yes Glean Hess, MD  SUMAtriptan (IMITREX) 100 MG tablet Take 1 tablet (100 mg total) by mouth daily as needed. 12/01/16  Yes Glean Hess, MD  tiZANidine (ZANAFLEX) 4 MG capsule Take 4 mg by mouth at bedtime as needed for muscle spasms.   Yes [provider]  Chlorzoxazone (LORZONE) 750 MG TABS Take 1 tablet by mouth daily.    [provider]  Clocortolone Pivalate (CLODERM) 0.1 % cream Apply 1 application topically 2 (two) times daily. Patient not taking: Reported on 12/16/2016 11/04/15   Glean Hess, MD  methocarbamol (ROBAXIN) 500 MG tablet Take 1 tablet by mouth 3 (three) times daily.    [provider]    Allergies as of 12/03/2016 - Review Complete 11/09/2016  Allergen Reaction Noted  . Aspirin Shortness Of Breath 12/03/2014  . Hydrocodone Shortness Of Breath 04/22/2015  . Bupropion hcl  12/03/2014  . Codeine Itching 12/03/2014  . Divalproex sodium er Nausea And Vomiting 04/22/2015  . Duloxetine hcl  12/03/2014  . Escitalopram  12/03/2014  . Morphine sulfate Itching 12/03/2014  . Naproxen  12/03/2014  . Ampicillin  Rash 12/03/2014  . Oxybutynin Other (See Comments) 09/29/2016  . Sulfa antibiotics Rash 12/03/2014    Family History  Problem Relation Age of Onset  . Rheum arthritis Mother   . Breast cancer Maternal Aunt   . Breast cancer Maternal Grandmother     Social History   Social History  . Marital status: Married    Spouse name: N/A  . Number of children: N/A  . Years of education: N/A   Occupational History  . Not on file.   Social History Main Topics  . Smoking status: Former Research scientist (life sciences)  . Smokeless tobacco: Never Used  . Alcohol use 1.8 oz/week    2  Standard drinks or equivalent, 1 Glasses of wine per week  . Drug use: No  . Sexual activity: Not on file   Other Topics Concern  . Not on file   Social History Narrative  . No narrative on file    Review of Systems: See HPI, otherwise negative ROS  Physical Exam: BP 130/80   Pulse 66   Temp 97.9 F (36.6 C) (Temporal)   Resp 16   Ht 5\' 6"  (1.676 m)   Wt 204 lb (92.5 kg)   SpO2 98%   BMI 32.93 kg/m  General:   Alert,  pleasant and cooperative in NAD Head:  Normocephalic and atraumatic. Neck:  Supple; no masses or thyromegaly. Lungs:  Clear throughout to auscultation.    Heart:  Regular rate and rhythm. Abdomen:  Soft, nontender and nondistended. Normal bowel sounds, without guarding, and without rebound.   Neurologic:  Alert and  oriented x4;  grossly normal neurologically.  Impression/Plan: Alicia Nichols is now here to undergo a screening colonoscopy.  Risks, benefits, and alternatives regarding colonoscopy have been reviewed with the patient.  Questions have been answered.  All parties agreeable.

## 2016-12-16 NOTE — Anesthesia Preprocedure Evaluation (Signed)
Anesthesia Evaluation  Patient identified by MRN, date of birth, ID band Patient awake    Reviewed: Allergy & Precautions, H&P , NPO status , Patient's Chart, lab work & pertinent test results  Airway Mallampati: II  TM Distance: >3 FB Neck ROM: full    Dental no notable dental hx.    Pulmonary asthma , former smoker,    Pulmonary exam normal breath sounds clear to auscultation       Cardiovascular hypertension, Normal cardiovascular exam Rhythm:regular Rate:Normal     Neuro/Psych PSYCHIATRIC DISORDERS  Neuromuscular disease    GI/Hepatic   Endo/Other    Renal/GU      Musculoskeletal   Abdominal   Peds  Hematology   Anesthesia Other Findings   Reproductive/Obstetrics                             Anesthesia Physical Anesthesia Plan  ASA: II  Anesthesia Plan: General   Post-op Pain Management:    Induction:   PONV Risk Score and Plan: 3 and Propofol  Airway Management Planned:   Additional Equipment:   Intra-op Plan:   Post-operative Plan:   Informed Consent: I have reviewed the patients History and Physical, chart, labs and discussed the procedure including the risks, benefits and alternatives for the proposed anesthesia with the patient or authorized representative who has indicated his/her understanding and acceptance.     Plan Discussed with: CRNA  Anesthesia Plan Comments:         Anesthesia Quick Evaluation

## 2016-12-16 NOTE — Transfer of Care (Signed)
Immediate Anesthesia Transfer of Care Note  Patient: Alicia Nichols  Procedure(s) Performed: Procedure(s): COLONOSCOPY WITH PROPOFOL (N/A) POLYPECTOMY INTESTINAL  Patient Location: PACU  Anesthesia Type: General  Level of Consciousness: awake, alert  and patient cooperative  Airway and Oxygen Therapy: Patient Spontanous Breathing and Patient connected to supplemental oxygen  Post-op Assessment: Post-op Vital signs reviewed, Patient's Cardiovascular Status Stable, Respiratory Function Stable, Patent Airway and No signs of Nausea or vomiting  Post-op Vital Signs: Reviewed and stable  Complications: No apparent anesthesia complications

## 2016-12-16 NOTE — Anesthesia Procedure Notes (Signed)
Performed by: Karsen Fellows Pre-anesthesia Checklist: Patient identified, Emergency Drugs available, Suction available, Timeout performed and Patient being monitored Patient Re-evaluated:Patient Re-evaluated prior to induction Oxygen Delivery Method: Nasal cannula Placement Confirmation: positive ETCO2       

## 2016-12-21 ENCOUNTER — Encounter: Payer: Self-pay | Admitting: Gastroenterology

## 2016-12-23 ENCOUNTER — Encounter: Payer: Self-pay | Admitting: Gastroenterology

## 2017-03-21 ENCOUNTER — Telehealth: Payer: Self-pay | Admitting: Internal Medicine

## 2017-03-21 ENCOUNTER — Encounter: Payer: Self-pay | Admitting: Internal Medicine

## 2017-03-21 ENCOUNTER — Ambulatory Visit (INDEPENDENT_AMBULATORY_CARE_PROVIDER_SITE_OTHER): Payer: BLUE CROSS/BLUE SHIELD | Admitting: Internal Medicine

## 2017-03-21 VITALS — BP 138/80 | HR 70 | Temp 98.4°F | Ht 66.0 in | Wt 203.0 lb

## 2017-03-21 DIAGNOSIS — H6692 Otitis media, unspecified, left ear: Secondary | ICD-10-CM

## 2017-03-21 DIAGNOSIS — R1011 Right upper quadrant pain: Secondary | ICD-10-CM

## 2017-03-21 MED ORDER — PREDNISONE 10 MG PO TABS
ORAL_TABLET | ORAL | 0 refills | Status: DC
Start: 2017-03-21 — End: 2017-11-01

## 2017-03-21 MED ORDER — AMOXICILLIN-POT CLAVULANATE 875-125 MG PO TABS: 1.0000 | ORAL_TABLET | Freq: Two times a day (BID) | ORAL | 0 refills | Status: AC

## 2017-03-21 NOTE — Telephone Encounter (Signed)
Pt scheduled to come in this afternoon.

## 2017-03-21 NOTE — Telephone Encounter (Signed)
Pt called with complaints of ear infection.  Requesting antibiotic medication be called in. Will have front desk call pt for an appointment this after noon.

## 2017-03-21 NOTE — Progress Notes (Signed)
Date:  03/21/2017   Name:  Alicia Nichols   DOB:  11/20/1960   MRN:  [id number]   Chief Complaint: Ear Pain (X 5 days. LEFT EAR- feels like a ice pick is in her ear. Drove down rode and wind was going in and out of ear. 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 No drroping of mouth or speech off. ) Otalgia   This is a new problem. The current episode started yesterday. The problem occurs constantly. The problem has been unchanged. There has been no fever. Associated symptoms include abdominal pain. Pertinent negatives include no diarrhea, rhinorrhea, sore throat or vomiting.  Abdominal Pain  This is a recurrent problem. The current episode started more than 1 month ago. The onset quality is undetermined. The problem occurs intermittently. The quality of the pain is colicky and cramping. The abdominal pain radiates to the RUQ. Pertinent negatives include no constipation, diarrhea, fever or vomiting. She has tried antacids for the symptoms. The treatment provided mild relief.      Review of Systems  Constitutional: Negative for chills, fatigue and fever.  HENT: Positive for ear pain. Negative for postnasal drip, rhinorrhea, sinus pressure and sore throat.   Eyes: Negative for visual disturbance.  Respiratory: Negative for chest tightness and shortness of breath.   Cardiovascular: Negative for chest pain and palpitations.  Gastrointestinal: Positive for abdominal pain. Negative for constipation, diarrhea and vomiting.    Patient Active Problem List   Diagnosis Date Noted  . Polyp of sigmoid colon   . Hyperlipidemia, mild 11/10/2016  . ACL (anterior cruciate ligament) tear 10/21/2015  . Rosacea 10/21/2015  . Fibrocystic breast changes 06/06/2015  . Essential (primary) hypertension 12/03/2014  . Urge incontinence 12/03/2014  . H/O peptic ulcer 12/03/2014  . Myalgia and myositis 12/03/2014  . Migraine without aura and without status migrainosus, not intractable  12/03/2014  . Clinical depression 12/03/2014  . Mild intermittent asthma without complication 93/79/0240  . Hot flash, menopausal 12/03/2014  . Idiopathic insomnia 12/03/2014  . Family history of breast cancer 12/03/2014    Prior to Admission medications   Medication Sig Start Date End Date Taking? Authorizing Provider  albuterol (PROVENTIL HFA;VENTOLIN HFA) 108 (90 Base) MCG/ACT inhaler Inhale 2 puffs into the lungs every 6 (six) hours as needed for wheezing or shortness of breath. 09/07/16   Glean Hess, MD  Chlorzoxazone (LORZONE) 750 MG TABS Take 1 tablet by mouth daily.    [provider]  Clocortolone Pivalate (CLODERM) 0.1 % cream Apply 1 application topically 2 (two) times daily. Patient not taking: Reported on 12/16/2016 11/04/15   Glean Hess, MD  Doxepin HCl 5 % CREA Apply topically.    [provider]  estradiol (VIVELLE-DOT) 0.05 MG/24HR patch Place 1 patch (0.05 mg total) onto the skin 2 (two) times a week. 11/11/16   Glean Hess, MD  lidocaine (XYLOCAINE) 5 % ointment APPLY 2-3 GRAMS (1 GRAM=1 INCH) TO AFFECTED AREA 4 TIMES DAILY 09/05/15   [provider]  lisinopril-hydrochlorothiazide (PRINZIDE,ZESTORETIC) 20-12.5 MG tablet Take 1 tablet by mouth daily. 11/09/16   Glean Hess, MD  LYRICA 50 MG capsule Take 2 capsules by mouth Nightly. As needed can take 1 during day also- 100 to 200 mg a day 03/31/15   [provider]  methocarbamol (ROBAXIN) 500 MG tablet Take 1 tablet by mouth 3 (three) times daily.    [provider]  NONFORMULARY OR COMPOUNDED  ITEM Apply 1 application topically 4 (four) times daily.    [provider]  oxyCODONE-acetaminophen (PERCOCET) 7.5-325 MG per tablet Take 1 tablet by mouth 3 (three) times daily. Pt taking 8 times each day due to surgery    [provider]  ranitidine (ZANTAC) 150 MG tablet Take 1 tablet by mouth 2 (two) times daily.    [provider]    solifenacin (VESICARE) 10 MG tablet Take 1 tablet (10 mg total) by mouth daily. 11/23/16   Glean Hess, MD  SUMAtriptan (IMITREX) 100 MG tablet Take 1 tablet (100 mg total) by mouth daily as needed. 12/01/16   Glean Hess, MD  tiZANidine (ZANAFLEX) 4 MG capsule Take 4 mg by mouth at bedtime as needed for muscle spasms.    [provider]    Allergies  Allergen Reactions  . Aspirin Shortness Of Breath  . Hydrocodone Shortness Of Breath  . Bupropion Hcl     intolerance  . Codeine Itching    Other reaction(s): Rash  . Divalproex Sodium Er Nausea And Vomiting  . Duloxetine Hcl     intolerance  . Escitalopram     intolerance  . Morphine Sulfate Itching  . Naproxen     delusions  . Ampicillin Rash  . Oxybutynin Other (See Comments)    Headache  . Sulfa Antibiotics Rash    Past Surgical History:  Procedure Laterality Date  . BACK SURGERY    . COLONOSCOPY WITH PROPOFOL N/A 12/16/2016   Procedure: COLONOSCOPY WITH PROPOFOL;  Surgeon: Lucilla Lame, MD;  Location: [place];  Service: Endoscopy;  Laterality: N/A;  . HAND SURGERY Left   . HAND SURGERY    . KNEE ARTHROSCOPY    . LAMINECTOMY  1992  . MENISCUS REPAIR Right 01/2014  . POLYPECTOMY  12/16/2016   Procedure: POLYPECTOMY INTESTINAL;  Surgeon: Lucilla Lame, MD;  Location: [place];  Service: Endoscopy;;  . TOTAL ABDOMINAL HYSTERECTOMY  2011    Social History  Substance Use Topics  . Smoking status: Former Research scientist (life sciences)  . Smokeless tobacco: Never Used  . Alcohol use 1.8 oz/week    2 Standard drinks or equivalent, 1 Glasses of wine per week     Medication list has been reviewed and updated.  PHQ 2/9 Scores 11/04/2015  PHQ - 2 Score 0    Physical Exam  Constitutional: She is oriented to person, place, and time. She appears well-developed. No distress.  HENT:  Head: Normocephalic and atraumatic.  Right Ear: Tympanic membrane and ear canal normal.  Left Ear: Ear canal normal. A  middle ear effusion is present.  Nose: Right sinus exhibits no maxillary sinus tenderness. Left sinus exhibits no maxillary sinus tenderness.  Cardiovascular: Normal rate, regular rhythm and normal heart sounds.   Pulmonary/Chest: Effort normal and breath sounds normal. No respiratory distress.  Abdominal: There is generalized tenderness. There is no rigidity, no rebound, no guarding and no CVA tenderness.  Musculoskeletal: Normal range of motion.  Neurological: She is alert and oriented to person, place, and time.  Skin: Skin is warm and dry. No rash noted.  Psychiatric: She has a normal mood and affect. Her behavior is normal. Thought content normal.  Nursing note and vitals reviewed.   BP 138/80   Pulse 70   Temp 98.4 F (36.9 C) (Oral)   Ht 5\' 6"  (1.676 m)   Wt 203 lb (92.1 kg)   SpO2 99%   BMI 32.77 kg/m   Assessment and Plan:  1. Otitis of left ear Begin augmentin Keep prednisone if sx of Bell's palsy  - amoxicillin-clavulanate (AUGMENTIN) 875-125 MG tablet; Take 1 tablet by mouth 2 (two) times daily.  Dispense: 20 tablet; Refill: 0 - predniSONE (DELTASONE) 10 MG tablet; Take 6 on day 1, 5 on day 2, 4 on day 3, 3 on day 4, 2 on day 5 and 1 on day 1 then stop.  Dispense: 21 tablet; Refill: 0  2. Colicky RUQ abdominal pain May be gall bladder disease; pt defers [name] for now Low fat, low protein diet   Meds ordered this encounter  Medications  . amoxicillin-clavulanate (AUGMENTIN) 875-125 MG tablet    Sig: Take 1 tablet by mouth 2 (two) times daily.    Dispense:  20 tablet    Refill:  0  . predniSONE (DELTASONE) 10 MG tablet    Sig: Take 6 on day 1, 5 on day 2, 4 on day 3, 3 on day 4, 2 on day 5 and 1 on day 1 then stop.    Dispense:  21 tablet    Refill:  0    Partially dictated using Editor, commissioning. Any errors are unintentional.  Halina Maidens, MD [facility]  03/21/2017

## 2017-07-14 ENCOUNTER — Other Ambulatory Visit: Payer: Self-pay

## 2017-07-14 DIAGNOSIS — Z79890 Hormone replacement therapy (postmenopausal): Secondary | ICD-10-CM

## 2017-07-14 MED ORDER — ESTRADIOL 0.05 MG/24HR TD PTTW: 1.0000 | MEDICATED_PATCH | TRANSDERMAL | 12 refills | Status: DC

## 2017-09-12 ENCOUNTER — Other Ambulatory Visit: Payer: Self-pay | Admitting: Internal Medicine

## 2017-11-01 ENCOUNTER — Ambulatory Visit (INDEPENDENT_AMBULATORY_CARE_PROVIDER_SITE_OTHER): Payer: BLUE CROSS/BLUE SHIELD | Admitting: Internal Medicine

## 2017-11-01 ENCOUNTER — Encounter: Payer: Self-pay | Admitting: Internal Medicine

## 2017-11-01 VITALS — BP 138/80 | HR 82 | Temp 98.2°F | Resp 16 | Ht 66.0 in | Wt 209.0 lb

## 2017-11-01 DIAGNOSIS — G43009 Migraine without aura, not intractable, without status migrainosus: Secondary | ICD-10-CM

## 2017-11-01 DIAGNOSIS — B370 Candidal stomatitis: Secondary | ICD-10-CM

## 2017-11-01 MED ORDER — SUMATRIPTAN SUCCINATE 100 MG PO TABS: 100.0000 mg | ORAL_TABLET | Freq: Every day | ORAL | 3 refills | Status: DC | PRN

## 2017-11-01 MED ORDER — LORATADINE-PSEUDOEPHEDRINE ER 5-120 MG PO TB12: 1.0000 | ORAL_TABLET | Freq: Two times a day (BID) | ORAL | 2 refills | Status: DC

## 2017-11-01 MED ORDER — LISINOPRIL-HYDROCHLOROTHIAZIDE 20-12.5 MG PO TABS
1.0000 | ORAL_TABLET | Freq: Every day | ORAL | 3 refills | Status: DC
Start: 2017-11-01 — End: 2018-11-14

## 2017-11-01 MED ORDER — CLOTRIMAZOLE 10 MG MT TROC: 10.0000 mg | Freq: Every day | OROMUCOSAL | 0 refills | Status: DC

## 2017-11-01 NOTE — Progress Notes (Signed)
Date:  11/01/2017   Name:  Alicia Nichols   DOB:  12-23-1960   MRN:  [id number]   Chief Complaint: Sore Throat and Oral Pain (white patches on gum line ) Sore Throat   This is a new problem. The current episode started in the past 7 days. The problem has been rapidly improving. Neither side of throat is experiencing more pain than the other. There has been no fever. The pain is mild. Associated symptoms include ear pain and a plugged ear sensation. Pertinent negatives include no headaches, shortness of breath or trouble swallowing.  Mouth Lesions   The current episode started 5 to 7 days ago. The onset was sudden. The problem has been unchanged. The problem is moderate. Nothing relieves the symptoms. Nothing aggravates the symptoms. Associated symptoms include ear pain, mouth sores (and change in taste with tongue burning) and sore throat. Pertinent negatives include no fever, no headaches and no wheezing.   Migraine - still having headaches intermittently.  They respond well to imitrex but she is out of medication and needs refills.   Review of Systems  Constitutional: Positive for fatigue. Negative for chills and fever.  HENT: Positive for ear pain, mouth sores (and change in taste with tongue burning) and sore throat. Negative for trouble swallowing.   Respiratory: Negative for chest tightness, shortness of breath and wheezing.   Cardiovascular: Negative for palpitations.  Allergic/Immunologic: Negative for environmental allergies.  Neurological: Negative for dizziness and headaches.    Patient Active Problem List   Diagnosis Date Noted  . Polyp of sigmoid colon   . Hyperlipidemia, mild 11/10/2016  . ACL (anterior cruciate ligament) tear 10/21/2015  . Rosacea 10/21/2015  . Fibrocystic breast changes 06/06/2015  . Essential (primary) hypertension 12/03/2014  . Urge incontinence 12/03/2014  . H/O peptic ulcer 12/03/2014  . Myalgia and myositis 12/03/2014  . Migraine without aura  and without status migrainosus, not intractable 12/03/2014  . Clinical depression 12/03/2014  . Mild intermittent asthma without complication 93/23/5573  . Hot flash, menopausal 12/03/2014  . Idiopathic insomnia 12/03/2014  . Family history of breast cancer 12/03/2014    Prior to Admission medications   Medication Sig Start Date End Date Taking? Authorizing Provider  albuterol (PROVENTIL HFA;VENTOLIN HFA) 108 (90 Base) MCG/ACT inhaler Inhale 2 puffs into the lungs every 6 (six) hours as needed for wheezing or shortness of breath. 09/07/16  Yes Glean Hess, MD  Chlorzoxazone (LORZONE) 750 MG TABS Take 1 tablet by mouth daily.   Yes [provider]  Clocortolone Pivalate (CLODERM) 0.1 % cream Apply 1 application topically 2 (two) times daily. 11/04/15  Yes Glean Hess, MD  diphenhydrAMINE (SOMINEX) 25 MG tablet Take 25 mg by mouth at bedtime as needed for sleep.   Yes [provider]  Doxepin HCl 5 % CREA Apply topically.   Yes [provider]  estradiol (VIVELLE-DOT) 0.05 MG/24HR patch Place 1 patch (0.05 mg total) onto the skin 2 (two) times a week. 07/14/17  Yes Glean Hess, MD  lidocaine (XYLOCAINE) 5 % ointment APPLY 2-3 GRAMS (1 GRAM=1 INCH) TO AFFECTED AREA 4 TIMES DAILY 09/05/15  Yes [provider]  lisinopril-hydrochlorothiazide (PRINZIDE,ZESTORETIC) 20-12.5 MG tablet Take 1 tablet by mouth daily. 11/09/16  Yes Glean Hess, MD  LYRICA 50 MG capsule Take 2 capsules by mouth Nightly. As needed can take 1 during day also- 100 to 200 mg a day 03/31/15  Yes [provider]  methocarbamol (ROBAXIN) 500  MG tablet Take 1 tablet by mouth 3 (three) times daily.   Yes [provider]  NONFORMULARY OR COMPOUNDED ITEM Apply 1 application topically 4 (four) times daily.   Yes [provider]  oxyCODONE-acetaminophen (PERCOCET) 7.5-325 MG per tablet Take 1 tablet by mouth 3 (three) times daily. Pt taking 8 times each day due  to surgery   Yes [provider]  ranitidine (ZANTAC) 150 MG tablet Take 1 tablet by mouth 2 (two) times daily.   Yes [provider]  SUMAtriptan (IMITREX) 100 MG tablet Take 1 tablet (100 mg total) by mouth daily as needed. 12/01/16  Yes Glean Hess, MD  tiZANidine (ZANAFLEX) 4 MG capsule Take 4 mg by mouth at bedtime as needed for muscle spasms.   Yes [provider]    Allergies  Allergen Reactions  . Aspirin Shortness Of Breath  . Hydrocodone Shortness Of Breath  . Bupropion Hcl     intolerance  . Codeine Itching    Other reaction(s): Rash  . Divalproex Sodium Er Nausea And Vomiting  . Duloxetine Hcl     intolerance  . Escitalopram     intolerance  . Morphine Sulfate Itching  . Naproxen     delusions  . Ampicillin Rash  . Oxybutynin Other (See Comments)    Headache  . Sulfa Antibiotics Rash    Past Surgical History:  Procedure Laterality Date  . BACK SURGERY    . COLONOSCOPY WITH PROPOFOL N/A 12/16/2016   Procedure: COLONOSCOPY WITH PROPOFOL;  Surgeon: Lucilla Lame, MD;  Location: [place];  Service: Endoscopy;  Laterality: N/A;  . HAND SURGERY Left   . HAND SURGERY    . KNEE ARTHROSCOPY    . LAMINECTOMY  1992  . MENISCUS REPAIR Right 01/2014  . POLYPECTOMY  12/16/2016   Procedure: POLYPECTOMY INTESTINAL;  Surgeon: Lucilla Lame, MD;  Location: [place];  Service: Endoscopy;;  . TOTAL ABDOMINAL HYSTERECTOMY  2011    Social History   Tobacco Use  . Smoking status: Former Research scientist (life sciences)  . Smokeless tobacco: Never Used  Substance Use Topics  . Alcohol use: Yes    Alcohol/week: 1.8 oz    Types: 2 Standard drinks or equivalent, 1 Glasses of wine per week  . Drug use: No     Medication list has been reviewed and updated.  Current Meds  Medication Sig  . albuterol (PROVENTIL HFA;VENTOLIN HFA) 108 (90 Base) MCG/ACT inhaler Inhale 2 puffs into the lungs every 6 (six) hours as needed for wheezing or shortness of breath.   . Chlorzoxazone (LORZONE) 750 MG TABS Take 1 tablet by mouth daily.  Marland Kitchen Clocortolone Pivalate (CLODERM) 0.1 % cream Apply 1 application topically 2 (two) times daily.  . diphenhydrAMINE (SOMINEX) 25 MG tablet Take 25 mg by mouth at bedtime as needed for sleep.  . Doxepin HCl 5 % CREA Apply topically.  Marland Kitchen estradiol (VIVELLE-DOT) 0.05 MG/24HR patch Place 1 patch (0.05 mg total) onto the skin 2 (two) times a week.  . lidocaine (XYLOCAINE) 5 % ointment APPLY 2-3 GRAMS (1 GRAM=1 INCH) TO AFFECTED AREA 4 TIMES DAILY  . lisinopril-hydrochlorothiazide (PRINZIDE,ZESTORETIC) 20-12.5 MG tablet Take 1 tablet by mouth daily.  Marland Kitchen LYRICA 50 MG capsule Take 2 capsules by mouth Nightly. As needed can take 1 during day also- 100 to 200 mg a day  . methocarbamol (ROBAXIN) 500 MG tablet Take 1 tablet by mouth 3 (three) times daily.  . NONFORMULARY OR COMPOUNDED ITEM Apply 1 application topically  4 (four) times daily.  Marland Kitchen oxyCODONE-acetaminophen (PERCOCET) 7.5-325 MG per tablet Take 1 tablet by mouth 3 (three) times daily. Pt taking 8 times each day due to surgery  . ranitidine (ZANTAC) 150 MG tablet Take 1 tablet by mouth 2 (two) times daily.  . SUMAtriptan (IMITREX) 100 MG tablet Take 1 tablet (100 mg total) by mouth daily as needed.  Marland Kitchen tiZANidine (ZANAFLEX) 4 MG capsule Take 4 mg by mouth at bedtime as needed for muscle spasms.  . [DISCONTINUED] VESICARE 5 MG tablet TAKE 1 TABLET BY MOUTH EVERY DAY    PHQ 2/9 Scores 11/04/2015  PHQ - 2 Score 0    Physical Exam  Constitutional: She is oriented to person, place, and time. She appears well-developed. No distress.  HENT:  Head: Normocephalic and atraumatic.  Right Ear: Tympanic membrane is not erythematous and not retracted.  Left Ear: Tympanic membrane is not erythematous and not retracted.  Semi adherent white plaques on left buccal mucosa and inner lower lip Tongue white, mildly tender No ulcerations  Pulmonary/Chest: Effort normal. No respiratory distress.    Musculoskeletal: Normal range of motion.  Neurological: She is alert and oriented to person, place, and time.  Skin: Skin is warm and dry. No rash noted.  No ulcerations on hands or feet  Psychiatric: She has a normal mood and affect. Her behavior is normal. Thought content normal.  Nursing note and vitals reviewed.   BP 138/80   Pulse 82   Temp 98.2 F (36.8 C) (Oral)   Resp 16   Ht 5\' 6"  (1.676 m)   Wt 209 lb (94.8 kg)   SpO2 98%   BMI 33.73 kg/m   Assessment and Plan: 1. Thrush - clotrimazole (MYCELEX) 10 MG troche; Take 1 tablet (10 mg total) by mouth 5 (five) times daily.  Dispense: 35 tablet; Refill: 0  2. Migraine without aura and without status migrainosus, not intractable Doing well with imitrex prn - SUMAtriptan (IMITREX) 100 MG tablet; Take 1 tablet (100 mg total) by mouth daily as needed.  Dispense: 10 tablet; Refill: 3   Meds ordered this encounter  Medications  . clotrimazole (MYCELEX) 10 MG troche    Sig: Take 1 tablet (10 mg total) by mouth 5 (five) times daily.    Dispense:  35 tablet    Refill:  0  . SUMAtriptan (IMITREX) 100 MG tablet    Sig: Take 1 tablet (100 mg total) by mouth daily as needed.    Dispense:  10 tablet    Refill:  3  . lisinopril-hydrochlorothiazide (PRINZIDE,ZESTORETIC) 20-12.5 MG tablet    Sig: Take 1 tablet by mouth daily.    Dispense:  90 tablet    Refill:  3  . loratadine-pseudoephedrine (CLARITIN-D 12 HOUR) 5-120 MG tablet    Sig: Take 1 tablet by mouth 2 (two) times daily.    Dispense:  60 tablet    Refill:  2    Partially dictated using Editor, commissioning. Any errors are unintentional.  Halina Maidens, MD [facility]  11/01/2017   Meds ordered this encounter  Medications  . clotrimazole (MYCELEX) 10 MG troche    Sig: Take 1 tablet (10 mg total) by mouth 5 (five) times daily.    Dispense:  35 tablet    Refill:  0  . SUMAtriptan (IMITREX) 100 MG tablet    Sig: Take 1 tablet  (100 mg total) by mouth daily as needed.    Dispense:  10 tablet  Refill:  3  . lisinopril-hydrochlorothiazide (PRINZIDE,ZESTORETIC) 20-12.5 MG tablet    Sig: Take 1 tablet by mouth daily.    Dispense:  90 tablet    Refill:  3  . loratadine-pseudoephedrine (CLARITIN-D 12 HOUR) 5-120 MG tablet    Sig: Take 1 tablet by mouth 2 (two) times daily.    Dispense:  60 tablet    Refill:  2    Partially dictated using Editor, commissioning. Any errors are unintentional.  Halina Maidens, MD [facility]  11/01/2017   There are no diagnoses linked to this encounter.

## 2017-11-09 ENCOUNTER — Encounter: Payer: Self-pay | Admitting: Internal Medicine

## 2017-11-10 ENCOUNTER — Encounter: Payer: Self-pay | Admitting: Internal Medicine

## 2017-11-10 ENCOUNTER — Ambulatory Visit (INDEPENDENT_AMBULATORY_CARE_PROVIDER_SITE_OTHER): Payer: BLUE CROSS/BLUE SHIELD | Admitting: Internal Medicine

## 2017-11-10 VITALS — BP 126/80 | HR 69 | Temp 98.2°F | Resp 16 | Ht 66.0 in | Wt 209.2 lb

## 2017-11-10 DIAGNOSIS — Z1239 Encounter for other screening for malignant neoplasm of breast: Secondary | ICD-10-CM

## 2017-11-10 DIAGNOSIS — I10 Essential (primary) hypertension: Secondary | ICD-10-CM | POA: Diagnosis not present

## 2017-11-10 DIAGNOSIS — E785 Hyperlipidemia, unspecified: Secondary | ICD-10-CM

## 2017-11-10 DIAGNOSIS — K5903 Drug induced constipation: Secondary | ICD-10-CM | POA: Diagnosis not present

## 2017-11-10 DIAGNOSIS — Z6833 Body mass index (BMI) 33.0-33.9, adult: Secondary | ICD-10-CM | POA: Diagnosis not present

## 2017-11-10 DIAGNOSIS — Z0000 Encounter for general adult medical examination without abnormal findings: Secondary | ICD-10-CM

## 2017-11-10 DIAGNOSIS — G43009 Migraine without aura, not intractable, without status migrainosus: Secondary | ICD-10-CM

## 2017-11-10 DIAGNOSIS — M797 Fibromyalgia: Secondary | ICD-10-CM

## 2017-11-10 DIAGNOSIS — Z1231 Encounter for screening mammogram for malignant neoplasm of breast: Secondary | ICD-10-CM

## 2017-11-10 LAB — POCT URINALYSIS DIPSTICK
BILIRUBIN UA: NEGATIVE
GLUCOSE UA: NEGATIVE
KETONES UA: NEGATIVE
Leukocytes, UA: NEGATIVE
Nitrite, UA: NEGATIVE
Protein, UA: NEGATIVE
RBC UA: NEGATIVE
SPEC GRAV UA: 1.02 (ref 1.010–1.025)
Urobilinogen, UA: 0.2 E.U./dL
pH, UA: 6 (ref 5.0–8.0)

## 2017-11-10 MED ORDER — NALOXEGOL OXALATE 25 MG PO TABS: 25.0000 mg | ORAL_TABLET | Freq: Every day | ORAL | 5 refills | Status: DC

## 2017-11-10 NOTE — Progress Notes (Signed)
Date:  11/10/2017   Name:  Alicia Nichols   DOB:  [birth year]   MRN:  [id number]   Chief Complaint: Annual Exam Alicia Nichols is a 57 y.o. female who presents today for her Complete Annual Exam. She feels fairly well. She reports exercising some. She reports she is sleeping fairly well. Mammogram was done recently at [facility].  Hypertension  This is a chronic problem. The problem is controlled. Associated symptoms include neck pain. Pertinent negatives include no chest pain, headaches, palpitations or shortness of breath. Past treatments include ACE inhibitors and diuretics. The current treatment provides significant improvement.  Migraine   This is a recurrent problem. The problem occurs seasonly. Associated symptoms include neck pain. Pertinent negatives include no abdominal pain, coughing, dizziness, fever, hearing loss, tinnitus or vomiting. She has tried triptans for the symptoms. The treatment provided significant relief. Her past medical history is significant for hypertension.  Gastroesophageal Reflux  She complains of heartburn. She reports no abdominal pain, no chest pain, no coughing or no wheezing. This is a recurrent problem. The problem occurs occasionally. Pertinent negatives include no fatigue. She has tried a histamine-2 antagonist for the symptoms. The treatment provided significant relief.  Fibromyalgia - seeing pain management at [facility].  Taking Lyrica, percocet as needed.  Also muscle relaxants. Still having daily pain and difficulty sleeping. Constipation - only having stools once a week, feeling bloated.  Likely due to percocet.  She has not had any improvement with stool softeners, prunes or laxatives.  Review of Systems  Constitutional: Negative for chills, fatigue and fever.  HENT: Negative for congestion, hearing loss, tinnitus, trouble swallowing and voice change.   Eyes: Negative for visual disturbance.  Respiratory: Negative for cough, chest tightness, shortness of  breath and wheezing.   Cardiovascular: Negative for chest pain, palpitations and leg swelling.  Gastrointestinal: Positive for constipation and heartburn. Negative for abdominal pain, diarrhea and vomiting.  Endocrine: Negative for polydipsia and polyuria.  Genitourinary: Negative for dysuria, frequency, genital sores, vaginal bleeding and vaginal discharge.  Musculoskeletal: Positive for myalgias and neck pain. Negative for arthralgias, gait problem and joint swelling.  Skin: Negative for color change and rash.  Neurological: Negative for dizziness, tremors, light-headedness and headaches.  Hematological: Negative for adenopathy. Does not bruise/bleed easily.  Psychiatric/Behavioral: Negative for dysphoric mood and sleep disturbance. The patient is not nervous/anxious.     Patient Active Problem List   Diagnosis Date Noted  . Polyp of sigmoid colon   . Hyperlipidemia, mild 11/10/2016  . ACL (anterior cruciate ligament) tear 10/21/2015  . Rosacea 10/21/2015  . Fibrocystic breast changes 06/06/2015  . Essential (primary) hypertension 12/03/2014  . Urge incontinence 12/03/2014  . H/O peptic ulcer 12/03/2014  . Myalgia and myositis 12/03/2014  . Migraine without aura and without status migrainosus, not intractable 12/03/2014  . Clinical depression 12/03/2014  . Mild intermittent asthma without complication 94/85/4627  . Hot flash, menopausal 12/03/2014  . Idiopathic insomnia 12/03/2014  . Family history of breast cancer 12/03/2014    Prior to Admission medications   Medication Sig Start Date End Date Taking? Authorizing Provider  albuterol (PROVENTIL HFA;VENTOLIN HFA) 108 (90 Base) MCG/ACT inhaler Inhale 2 puffs into the lungs every 6 (six) hours as needed for wheezing or shortness of breath. 09/07/16  Yes Alicia Hess, MD  Chlorzoxazone (LORZONE) 750 MG TABS Take 1 tablet by mouth daily.   Yes [provider]  Clocortolone Pivalate (CLODERM) 0.1 % cream Apply 1  application  topically 2 (two) times daily. 11/04/15  Yes Alicia Hess, MD  clotrimazole (MYCELEX) 10 MG troche Take 1 tablet (10 mg total) by mouth 5 (five) times daily. 11/01/17  Yes Alicia Hess, MD  diphenhydrAMINE (SOMINEX) 25 MG tablet Take 25 mg by mouth at bedtime as needed for sleep.   Yes [provider]  Doxepin HCl 5 % CREA Apply topically.   Yes [provider]  estradiol (VIVELLE-DOT) 0.05 MG/24HR patch Place 1 patch (0.05 mg total) onto the skin 2 (two) times a week. 07/14/17  Yes Alicia Hess, MD  lidocaine (XYLOCAINE) 5 % ointment APPLY 2-3 GRAMS (1 GRAM=1 INCH) TO AFFECTED AREA 4 TIMES DAILY 09/05/15  Yes [provider]  lisinopril-hydrochlorothiazide (PRINZIDE,ZESTORETIC) 20-12.5 MG tablet Take 1 tablet by mouth daily. 11/01/17  Yes Alicia Hess, MD  loratadine-pseudoephedrine (CLARITIN-D 12 HOUR) 5-120 MG tablet Take 1 tablet by mouth 2 (two) times daily. 11/01/17  Yes Alicia Hess, MD  LYRICA 50 MG capsule Take 2 capsules by mouth Nightly. As needed can take 1 during day also- 100 to 200 mg a day 03/31/15  Yes [provider]  methocarbamol (ROBAXIN) 500 MG tablet Take 1 tablet by mouth 3 (three) times daily.   Yes [provider]  NONFORMULARY OR COMPOUNDED ITEM Apply 1 application topically 4 (four) times daily.   Yes [provider]  oxyCODONE-acetaminophen (PERCOCET) 7.5-325 MG per tablet Take 1 tablet by mouth 3 (three) times daily. Pt taking 8 times each day due to surgery   Yes [provider]  ranitidine (ZANTAC) 150 MG tablet Take 1 tablet by mouth 2 (two) times daily.   Yes [provider]  SUMAtriptan (IMITREX) 100 MG tablet Take 1 tablet (100 mg total) by mouth daily as needed. 11/01/17  Yes Alicia Hess, MD  tiZANidine (ZANAFLEX) 4 MG capsule Take 4 mg by mouth at bedtime as needed for muscle spasms.   Yes [provider]    Allergies  Allergen Reactions  .  Aspirin Shortness Of Breath  . Hydrocodone Shortness Of Breath  . Bupropion Hcl     intolerance  . Codeine Itching    Other reaction(s): Rash  . Divalproex Sodium Er Nausea And Vomiting  . Duloxetine Hcl     intolerance  . Escitalopram     intolerance  . Morphine Sulfate Itching  . Naproxen     delusions  . Ampicillin Rash  . Oxybutynin Other (See Comments)    Headache  . Sulfa Antibiotics Rash    Past Surgical History:  Procedure Laterality Date  . BACK SURGERY    . COLONOSCOPY WITH PROPOFOL N/A 12/16/2016   Procedure: COLONOSCOPY WITH PROPOFOL;  Surgeon: Lucilla Lame, MD;  Location: [place];  Service: Endoscopy;  Laterality: N/A;  . HAND SURGERY Left   . HAND SURGERY    . KNEE ARTHROSCOPY    . LAMINECTOMY  1992  . MENISCUS REPAIR Right 01/2014  . POLYPECTOMY  12/16/2016   Procedure: POLYPECTOMY INTESTINAL;  Surgeon: Lucilla Lame, MD;  Location: [place];  Service: Endoscopy;;  . TOTAL ABDOMINAL HYSTERECTOMY  2011    Social History   Tobacco Use  . Smoking status: Former Research scientist (life sciences)  . Smokeless tobacco: Never Used  Substance Use Topics  . Alcohol use: Yes    Alcohol/week: 1.8 oz    Types: 2 Standard drinks or equivalent, 1 Glasses of wine per week  . Drug use: No     Medication  list has been reviewed and updated.  Current Meds  Medication Sig  . albuterol (PROVENTIL HFA;VENTOLIN HFA) 108 (90 Base) MCG/ACT inhaler Inhale 2 puffs into the lungs every 6 (six) hours as needed for wheezing or shortness of breath.  . Chlorzoxazone (LORZONE) 750 MG TABS Take 1 tablet by mouth daily.  Marland Kitchen Clocortolone Pivalate (CLODERM) 0.1 % cream Apply 1 application topically 2 (two) times daily.  . clotrimazole (MYCELEX) 10 MG troche Take 1 tablet (10 mg total) by mouth 5 (five) times daily.  . diphenhydrAMINE (SOMINEX) 25 MG tablet Take 25 mg by mouth at bedtime as needed for sleep.  . Doxepin HCl 5 % CREA Apply topically.  Marland Kitchen estradiol (VIVELLE-DOT) 0.05 MG/24HR  patch Place 1 patch (0.05 mg total) onto the skin 2 (two) times a week.  Marland Kitchen lisinopril-hydrochlorothiazide (PRINZIDE,ZESTORETIC) 20-12.5 MG tablet Take 1 tablet by mouth daily.  Marland Kitchen loratadine-pseudoephedrine (CLARITIN-D 12 HOUR) 5-120 MG tablet Take 1 tablet by mouth 2 (two) times daily.  Marland Kitchen LYRICA 50 MG capsule Take 2 capsules by mouth Nightly. As needed can take 1 during day also- 100 to 200 mg a day  . methocarbamol (ROBAXIN) 500 MG tablet Take 1 tablet by mouth 3 (three) times daily.  . NONFORMULARY OR COMPOUNDED ITEM Apply 1 application topically 4 (four) times daily.  Marland Kitchen oxyCODONE-acetaminophen (PERCOCET) 7.5-325 MG per tablet Take 1 tablet by mouth 3 (three) times daily. Pt taking 8 times each day due to surgery  . ranitidine (ZANTAC) 150 MG tablet Take 1 tablet by mouth 2 (two) times daily.  . SUMAtriptan (IMITREX) 100 MG tablet Take 1 tablet (100 mg total) by mouth daily as needed.  Marland Kitchen tiZANidine (ZANAFLEX) 4 MG capsule Take 4 mg by mouth at bedtime as needed for muscle spasms.    PHQ 2/9 Scores 11/10/2017 11/04/2015  PHQ - 2 Score 0 0    Physical Exam  Constitutional: She is oriented to person, place, and time. She appears well-developed and well-nourished. No distress.  HENT:  Head: Normocephalic and atraumatic.  Right Ear: Tympanic membrane and ear canal normal.  Left Ear: Tympanic membrane and ear canal normal.  Nose: Right sinus exhibits no maxillary sinus tenderness. Left sinus exhibits no maxillary sinus tenderness.  Mouth/Throat: Uvula is midline and oropharynx is clear and moist.  Eyes: Conjunctivae and EOM are normal. Right eye exhibits no discharge. Left eye exhibits no discharge. No scleral icterus.  Neck: Normal range of motion. Carotid bruit is not present. No erythema present. No thyromegaly present.  Cardiovascular: Normal rate, regular rhythm, normal heart sounds and normal pulses.  Pulmonary/Chest: Effort normal. No respiratory distress. She has no wheezes. Right breast  exhibits no mass, no nipple discharge, no skin change and no tenderness. Left breast exhibits no mass, no nipple discharge, no skin change and no tenderness.  Abdominal: Soft. Bowel sounds are normal. There is no hepatosplenomegaly. There is no tenderness. There is no CVA tenderness.  Musculoskeletal: Normal range of motion.  Lymphadenopathy:    She has no cervical adenopathy.    She has no axillary adenopathy.  Neurological: She is alert and oriented to person, place, and time. She has normal reflexes. No cranial nerve deficit or sensory deficit.  Skin: Skin is warm, dry and intact. No rash noted.  Psychiatric: She has a normal mood and affect. Her speech is normal and behavior is normal. Thought content normal.  Nursing note and vitals reviewed.   BP 126/80   Pulse 69   Temp 98.2 F (36.8  C) (Oral)   Resp 16   Ht 5\' 6"  (1.676 m)   Wt 209 lb 3.2 oz (94.9 kg)   SpO2 98%   BMI 33.77 kg/m   Assessment and Plan: 1. Annual physical exam Pt will improve diet and add exercise as tolerated - POCT urinalysis dipstick  2. Breast cancer screening Recently completed at [facility]  3. Essential (primary) hypertension Controlled, continue current medications - CBC with Differential/Platelet - Comprehensive metabolic panel - TSH  4. Migraine without aura and without status migrainosus, not intractable Intermittent, responds to imitrex  5. Fibromyalgia Followed by pain management  6. Hyperlipidemia, mild Will advise if medication or other intervention is recommended - Lipid panel  7. Drug-induced constipation - naloxegol oxalate (MOVANTIK) 25 MG TABS tablet; Take 1 tablet (25 mg total) by mouth daily.  Dispense: 30 tablet; Refill: 5  8. BMI 33.0-33.9,adult Improve diet and exercise for weight loss Options discussed   Meds ordered this encounter  Medications  . naloxegol oxalate (MOVANTIK) 25 MG TABS tablet    Sig: Take 1 tablet (25 mg total) by mouth daily.    Dispense:  30  tablet    Refill:  5    Partially dictated using Editor, commissioning. Any errors are unintentional.  Halina Maidens, MD [facility]  11/10/2017  There are no diagnoses linked to this encounter.

## 2017-11-10 NOTE — Patient Instructions (Signed)
DASH Eating Plan DASH stands for "Dietary Approaches to Stop Hypertension." The DASH eating plan is a healthy eating plan that has been shown to reduce high blood pressure (hypertension). It may also reduce your risk for type 2 diabetes, heart disease, and stroke. The DASH eating plan may also help with weight loss. What are tips for following this plan? General guidelines  Avoid eating more than 2,300 mg (milligrams) of salt (sodium) a day. If you have hypertension, you may need to reduce your sodium intake to 1,500 mg a day.  Limit alcohol intake to no more than 1 drink a day for nonpregnant women and 2 drinks a day for men. One drink equals 12 oz of beer, 5 oz of wine, or 1 oz of hard liquor.  Work with your health care provider to maintain a healthy body weight or to lose weight. Ask what an ideal weight is for you.  Get at least 30 minutes of exercise that causes your heart to beat faster (aerobic exercise) most days of the week. Activities may include walking, swimming, or biking.  Work with your health care provider or diet and nutrition specialist (dietitian) to adjust your eating plan to your individual calorie needs. Reading food labels  Check food labels for the amount of sodium per serving. Choose foods with less than 5 percent of the Daily Value of sodium. Generally, foods with less than 300 mg of sodium per serving fit into this eating plan.  To find whole grains, look for the word "whole" as the first word in the ingredient list. Shopping  Buy products labeled as "low-sodium" or "no salt added."  Buy fresh foods. Avoid canned foods and premade or frozen meals. Cooking  Avoid adding salt when cooking. Use salt-free seasonings or herbs instead of table salt or sea salt. Check with your health care provider or pharmacist before using salt substitutes.  Do not fry foods. Cook foods using healthy methods such as baking, boiling, grilling, and broiling instead.  Cook with  heart-healthy oils, such as olive, canola, soybean, or sunflower oil. Meal planning   Eat a balanced diet that includes: ? 5 or more servings of fruits and vegetables each day. At each meal, try to fill half of your plate with fruits and vegetables. ? Up to 6-8 servings of whole grains each day. ? Less than 6 oz of lean meat, poultry, or fish each day. A 3-oz serving of meat is about the same size as a deck of cards. One egg equals 1 oz. ? 2 servings of low-fat dairy each day. ? A serving of nuts, seeds, or beans 5 times each week. ? Heart-healthy fats. Healthy fats called Omega-3 fatty acids are found in foods such as flaxseeds and coldwater fish, like sardines, salmon, and mackerel.  Limit how much you eat of the following: ? Canned or prepackaged foods. ? Food that is high in trans fat, such as fried foods. ? Food that is high in saturated fat, such as fatty meat. ? Sweets, desserts, sugary drinks, and other foods with added sugar. ? Full-fat dairy products.  Do not salt foods before eating.  Try to eat at least 2 vegetarian meals each week.  Eat more home-cooked food and less restaurant, buffet, and fast food.  When eating at a restaurant, ask that your food be prepared with less salt or no salt, if possible. What foods are recommended? The items listed may not be a complete list. Talk with your dietitian about what   dietary choices are best for you. Grains Whole-grain or whole-wheat bread. Whole-grain or whole-wheat pasta. Brown rice. Oatmeal. Quinoa. Bulgur. Whole-grain and low-sodium cereals. Pita bread. Low-fat, low-sodium crackers. Whole-wheat flour tortillas. Vegetables Fresh or frozen vegetables (raw, steamed, roasted, or grilled). Low-sodium or reduced-sodium tomato and vegetable juice. Low-sodium or reduced-sodium tomato sauce and tomato paste. Low-sodium or reduced-sodium canned vegetables. Fruits All fresh, dried, or frozen fruit. Canned fruit in natural juice (without  added sugar). Meat and other protein foods Skinless chicken or turkey. Ground chicken or turkey. Pork with fat trimmed off. Fish and seafood. Egg whites. Dried beans, peas, or lentils. Unsalted nuts, nut butters, and seeds. Unsalted canned beans. Lean cuts of beef with fat trimmed off. Low-sodium, lean deli meat. Dairy Low-fat (1%) or fat-free (skim) milk. Fat-free, low-fat, or reduced-fat cheeses. Nonfat, low-sodium ricotta or cottage cheese. Low-fat or nonfat yogurt. Low-fat, low-sodium cheese. Fats and oils Soft margarine without trans fats. Vegetable oil. Low-fat, reduced-fat, or light mayonnaise and salad dressings (reduced-sodium). Canola, safflower, olive, soybean, and sunflower oils. Avocado. Seasoning and other foods Herbs. Spices. Seasoning mixes without salt. Unsalted popcorn and pretzels. Fat-free sweets. What foods are not recommended? The items listed may not be a complete list. Talk with your dietitian about what dietary choices are best for you. Grains Baked goods made with fat, such as croissants, muffins, or some breads. Dry pasta or rice meal packs. Vegetables Creamed or fried vegetables. Vegetables in a cheese sauce. Regular canned vegetables (not low-sodium or reduced-sodium). Regular canned tomato sauce and paste (not low-sodium or reduced-sodium). Regular tomato and vegetable juice (not low-sodium or reduced-sodium). Pickles. Olives. Fruits Canned fruit in a light or heavy syrup. Fried fruit. Fruit in cream or butter sauce. Meat and other protein foods Fatty cuts of meat. Ribs. Fried meat. Bacon. Sausage. Bologna and other processed lunch meats. Salami. Fatback. Hotdogs. Bratwurst. Salted nuts and seeds. Canned beans with added salt. Canned or smoked fish. Whole eggs or egg yolks. Chicken or turkey with skin. Dairy Whole or 2% milk, cream, and half-and-half. Whole or full-fat cream cheese. Whole-fat or sweetened yogurt. Full-fat cheese. Nondairy creamers. Whipped toppings.  Processed cheese and cheese spreads. Fats and oils Butter. Stick margarine. Lard. Shortening. Ghee. Bacon fat. Tropical oils, such as coconut, palm kernel, or palm oil. Seasoning and other foods Salted popcorn and pretzels. Onion salt, garlic salt, seasoned salt, table salt, and sea salt. Worcestershire sauce. Tartar sauce. Barbecue sauce. Teriyaki sauce. Soy sauce, including reduced-sodium. Steak sauce. Canned and packaged gravies. Fish sauce. Oyster sauce. Cocktail sauce. Horseradish that you find on the shelf. Ketchup. Mustard. Meat flavorings and tenderizers. Bouillon cubes. Hot sauce and Tabasco sauce. Premade or packaged marinades. Premade or packaged taco seasonings. Relishes. Regular salad dressings. Where to find more information:  National Heart, Lung, and Blood Institute: www.nhlbi.nih.gov  American Heart Association: www.heart.org Summary  The DASH eating plan is a healthy eating plan that has been shown to reduce high blood pressure (hypertension). It may also reduce your risk for type 2 diabetes, heart disease, and stroke.  With the DASH eating plan, you should limit salt (sodium) intake to 2,300 mg a day. If you have hypertension, you may need to reduce your sodium intake to 1,500 mg a day.  When on the DASH eating plan, aim to eat more fresh fruits and vegetables, whole grains, lean proteins, low-fat dairy, and heart-healthy fats.  Work with your health care provider or diet and nutrition specialist (dietitian) to adjust your eating plan to your individual   calorie needs. This information is not intended to replace advice given to you by your health care provider. Make sure you discuss any questions you have with your health care provider. Document Released: 05/13/2011 Document Revised: 05/17/2016 Document Reviewed: 05/17/2016 Elsevier Interactive Patient Education  2018 Elsevier Inc.  

## 2017-11-11 LAB — CBC WITH DIFFERENTIAL/PLATELET
BASOS ABS: 0 10*3/uL (ref 0.0–0.2)
Basos: 1 %
EOS (ABSOLUTE): 0.1 10*3/uL (ref 0.0–0.4)
EOS: 1 %
HEMATOCRIT: 37.3 % (ref 34.0–46.6)
Hemoglobin: 12.5 g/dL (ref 11.1–15.9)
Immature Grans (Abs): 0 10*3/uL (ref 0.0–0.1)
Immature Granulocytes: 0 %
LYMPHS ABS: 1.4 10*3/uL (ref 0.7–3.1)
Lymphs: 34 %
MCH: 29.3 pg (ref 26.6–33.0)
MCHC: 33.5 g/dL (ref 31.5–35.7)
MCV: 88 fL (ref 79–97)
MONOS ABS: 0.4 10*3/uL (ref 0.1–0.9)
Monocytes: 10 %
NEUTROS PCT: 54 %
Neutrophils Absolute: 2.2 10*3/uL (ref 1.4–7.0)
PLATELETS: 307 10*3/uL (ref 150–450)
RBC: 4.26 x10E6/uL (ref 3.77–5.28)
RDW: 13.8 % (ref 12.3–15.4)
WBC: 4 10*3/uL (ref 3.4–10.8)

## 2017-11-11 LAB — COMPREHENSIVE METABOLIC PANEL
ALK PHOS: 73 IU/L (ref 39–117)
ALT: 14 IU/L (ref 0–32)
AST: 18 IU/L (ref 0–40)
Albumin/Globulin Ratio: 1.5 (ref 1.2–2.2)
Albumin: 4.3 g/dL (ref 3.5–5.5)
BUN/Creatinine Ratio: 12 (ref 9–23)
BUN: 11 mg/dL (ref 6–24)
Bilirubin Total: 0.3 mg/dL (ref 0.0–1.2)
CO2: 24 mmol/L (ref 20–29)
CREATININE: 0.91 mg/dL (ref 0.57–1.00)
Calcium: 9.5 mg/dL (ref 8.7–10.2)
Chloride: 97 mmol/L (ref 96–106)
GFR calc Af Amer: 82 mL/min/{1.73_m2} (ref >59)
GFR calc non Af Amer: 71 mL/min/{1.73_m2} (ref >59)
GLOBULIN, TOTAL: 2.8 g/dL (ref 1.5–4.5)
Glucose: 84 mg/dL (ref 65–99)
POTASSIUM: 4 mmol/L (ref 3.5–5.2)
SODIUM: 136 mmol/L (ref 134–144)
Total Protein: 7.1 g/dL (ref 6.0–8.5)

## 2017-11-11 LAB — LIPID PANEL
CHOLESTEROL TOTAL: 259 mg/dL — AB (ref 100–199)
Chol/HDL Ratio: 3.5 ratio (ref 0.0–4.4)
HDL: 73 mg/dL (ref >39)
LDL Calculated: 162 mg/dL — ABNORMAL HIGH (ref 0–99)
TRIGLYCERIDES: 119 mg/dL (ref 0–149)
VLDL Cholesterol Cal: 24 mg/dL (ref 5–40)

## 2017-11-11 LAB — TSH: TSH: 1.71 u[IU]/mL (ref 0.450–4.500)

## 2018-02-07 ENCOUNTER — Other Ambulatory Visit: Payer: Self-pay | Admitting: Internal Medicine

## 2018-02-07 DIAGNOSIS — G43009 Migraine without aura, not intractable, without status migrainosus: Secondary | ICD-10-CM

## 2018-05-16 ENCOUNTER — Encounter: Payer: Self-pay | Admitting: Internal Medicine

## 2018-05-16 ENCOUNTER — Ambulatory Visit (INDEPENDENT_AMBULATORY_CARE_PROVIDER_SITE_OTHER): Payer: BLUE CROSS/BLUE SHIELD | Admitting: Internal Medicine

## 2018-05-16 VITALS — BP 120/78 | HR 83 | Temp 99.1°F | Ht 66.0 in | Wt 213.0 lb

## 2018-05-16 DIAGNOSIS — Z8711 Personal history of peptic ulcer disease: Secondary | ICD-10-CM

## 2018-05-16 DIAGNOSIS — I10 Essential (primary) hypertension: Secondary | ICD-10-CM | POA: Diagnosis not present

## 2018-05-16 DIAGNOSIS — R0602 Shortness of breath: Secondary | ICD-10-CM

## 2018-05-16 DIAGNOSIS — J4520 Mild intermittent asthma, uncomplicated: Secondary | ICD-10-CM

## 2018-05-16 DIAGNOSIS — R1013 Epigastric pain: Secondary | ICD-10-CM | POA: Insufficient documentation

## 2018-05-16 NOTE — Progress Notes (Signed)
Date:  05/16/2018   Name:  Alicia Nichols   DOB:  1960-07-31   MRN:  [id number]   Chief Complaint: Hypertension (Flu shot today. ) and Gastroesophageal Reflux (Zantac recalled. Tried pepcid and does not help. Wants to know what recommend.)  Hypertension  This is a chronic problem. The problem is controlled. Associated symptoms include shortness of breath. Pertinent negatives include no chest pain or palpitations. Past treatments include ACE inhibitors and diuretics. The current treatment provides significant improvement.  Gastroesophageal Reflux  She complains of abdominal pain, heartburn and nausea. She reports no chest pain, no coughing, no sore throat or no wheezing. This is a recurrent problem. The problem occurs frequently. The problem has been gradually worsening. The symptoms are aggravated by certain foods. Pertinent negatives include no fatigue. She has tried a histamine-2 antagonist (changed from zantac to pepcid and was not a beneficial) for the symptoms. The treatment provided mild (yesterday bought omeprazole but has not started it yet) relief.  Shortness of Breath  This is a new problem. The problem occurs daily. The problem has been unchanged. Associated symptoms include abdominal pain. Pertinent negatives include no chest pain, fever, leg swelling, sore throat, sputum production or wheezing. Nothing aggravates the symptoms. She has tried beta agonist inhalers for the symptoms. The treatment provided no relief.    Review of Systems  Constitutional: Negative for chills, fatigue and fever.  HENT: Negative for sore throat.   Respiratory: Positive for chest tightness and shortness of breath. Negative for cough, sputum production and wheezing.   Cardiovascular: Negative for chest pain, palpitations and leg swelling.  Gastrointestinal: Positive for abdominal distention, abdominal pain, constipation, diarrhea, heartburn and nausea.    Patient Active Problem List   Diagnosis Date  Noted  . Drug-induced constipation 11/10/2017  . Polyp of sigmoid colon   . Hyperlipidemia, mild 11/10/2016  . ACL (anterior cruciate ligament) tear 10/21/2015  . Rosacea 10/21/2015  . Fibrocystic breast changes 06/06/2015  . Essential (primary) hypertension 12/03/2014  . Urge incontinence 12/03/2014  . H/O peptic ulcer 12/03/2014  . Fibromyalgia 12/03/2014  . Migraine without aura and without status migrainosus, not intractable 12/03/2014  . Clinical depression 12/03/2014  . Mild intermittent asthma without complication 23/76/2831  . Hot flash, menopausal 12/03/2014  . Idiopathic insomnia 12/03/2014  . Family history of breast cancer 12/03/2014    Allergies  Allergen Reactions  . Aspirin Shortness Of Breath  . Hydrocodone Shortness Of Breath  . Bupropion Hcl     intolerance  . Codeine Itching    Other reaction(s): Rash  . Divalproex Sodium Er Nausea And Vomiting  . Duloxetine Hcl     intolerance  . Escitalopram     intolerance  . Morphine Sulfate Itching  . Naproxen     delusions  . Ampicillin Rash  . Oxybutynin Other (See Comments)    Headache  . Sulfa Antibiotics Rash    Past Surgical History:  Procedure Laterality Date  . arm surgery Left 2018   reduction osteotomy  . BACK SURGERY    . COLONOSCOPY WITH PROPOFOL N/A 12/16/2016   Procedure: COLONOSCOPY WITH PROPOFOL;  Surgeon: Lucilla Lame, MD;  Location: [facility];  Service: Endoscopy;  Laterality: N/A;  . HAND SURGERY Left 2008   trauma surgery after dog bite  . KNEE ARTHROSCOPY    . LAMINECTOMY  1992  . MENISCUS REPAIR Right 01/2014  . POLYPECTOMY  12/16/2016   Procedure: POLYPECTOMY INTESTINAL;  Surgeon: Lucilla Lame, MD;  Location:  [place];  Service: Endoscopy;;  . TOTAL ABDOMINAL HYSTERECTOMY  2011    Social History   Tobacco Use  . Smoking status: Former Research scientist (life sciences)  . Smokeless tobacco: Never Used  Substance Use Topics  . Alcohol use: Yes    Alcohol/week: 3.0 standard drinks     Types: 2 Standard drinks or equivalent, 1 Glasses of wine per week  . Drug use: No     Medication list has been reviewed and updated.  Current Meds  Medication Sig  . albuterol (PROVENTIL HFA;VENTOLIN HFA) 108 (90 Base) MCG/ACT inhaler Inhale 2 puffs into the lungs every 6 (six) hours as needed for wheezing or shortness of breath.  . Chlorzoxazone (LORZONE) 750 MG TABS Take 1 tablet by mouth daily.  Marland Kitchen Clocortolone Pivalate (CLODERM) 0.1 % cream Apply 1 application topically 2 (two) times daily.  . clotrimazole (MYCELEX) 10 MG troche Take 1 tablet (10 mg total) by mouth 5 (five) times daily.  . Doxepin HCl 5 % CREA Apply topically.  Marland Kitchen estradiol (VIVELLE-DOT) 0.05 MG/24HR patch Place 1 patch (0.05 mg total) onto the skin 2 (two) times a week.  . lidocaine (XYLOCAINE) 5 % ointment APPLY 2-3 GRAMS (1 GRAM=1 INCH) TO AFFECTED AREA 4 TIMES DAILY  . lisinopril-hydrochlorothiazide (PRINZIDE,ZESTORETIC) 20-12.5 MG tablet Take 1 tablet by mouth daily.  Marland Kitchen loratadine-pseudoephedrine (CLARITIN-D 12 HOUR) 5-120 MG tablet Take 1 tablet by mouth 2 (two) times daily.  Marland Kitchen LYRICA 50 MG capsule Take 2 capsules by mouth Nightly. As needed can take 1 during day also- 100 to 200 mg a day  . methocarbamol (ROBAXIN) 500 MG tablet Take 1 tablet by mouth 3 (three) times daily.  . NONFORMULARY OR COMPOUNDED ITEM Apply 1 application topically 4 (four) times daily.  Marland Kitchen oxyCODONE-acetaminophen (PERCOCET) 7.5-325 MG per tablet Take 1 tablet by mouth 3 (three) times daily. Pt taking 8 times each day due to surgery  . SUMAtriptan (IMITREX) 100 MG tablet TAKE 1 TABLET (100 MG TOTAL) BY MOUTH DAILY AS NEEDED.  Marland Kitchen tiZANidine (ZANAFLEX) 4 MG capsule Take 4 mg by mouth at bedtime as needed for muscle spasms.    PHQ 2/9 Scores 11/10/2017 11/04/2015  PHQ - 2 Score 0 0    Physical Exam  Constitutional: She is oriented to person, place, and time. She appears well-developed. No distress.  HENT:  Head: Normocephalic and atraumatic.    Neck: Normal range of motion. Neck supple.  Cardiovascular: Normal rate, regular rhythm and normal heart sounds.  Pulmonary/Chest: Effort normal and breath sounds normal. No respiratory distress. She has no wheezes. She has no rhonchi.  Abdominal: Soft. She exhibits no mass. Bowel sounds are decreased. There is no splenomegaly or hepatomegaly. There is tenderness in the epigastric area. There is no rebound and no guarding.  Musculoskeletal: Normal range of motion.  Lymphadenopathy:    She has no cervical adenopathy.  Neurological: She is alert and oriented to person, place, and time.  Skin: Skin is warm and dry. No rash noted.  Psychiatric: She has a normal mood and affect. Her behavior is normal. Thought content normal.  Nursing note and vitals reviewed.   BP 120/78 (BP Location: Right Arm, Patient Position: Sitting, Cuff Size: Large)   Pulse 83   Ht 5\' 6"  (1.676 m)   Wt 213 lb (96.6 kg)   SpO2 98%   BMI 34.38 kg/m   Assessment and Plan: 1. Essential (primary) hypertension controlled  2. H/O peptic ulcer Begin PPI daily  3. Epigastric pain Rule  out ulcer and gall bladder disease May need EGD - H. pylori breath test - US Abdomen Limited RUQ; Future  4. Shortness of breath Not related to exertion Consider Cardiology evaluation  5. Mild intermittent asthma without complication Continue albuterol inhaler PRN   Partially dictated using Editor, commissioning. Any errors are unintentional.  Halina Maidens, MD [facility]  05/16/2018

## 2018-05-16 NOTE — Patient Instructions (Signed)
Start Nexium or Prilosec (omeprazole) daily

## 2018-05-18 LAB — H. PYLORI BREATH TEST: H pylori Breath Test: NEGATIVE

## 2018-05-22 ENCOUNTER — Ambulatory Visit: Payer: Self-pay

## 2018-06-19 ENCOUNTER — Ambulatory Visit: Payer: BLUE CROSS/BLUE SHIELD

## 2018-06-20 ENCOUNTER — Ambulatory Visit
Admission: RE | Admit: 2018-06-20 | Discharge: 2018-06-20 | Disposition: A | Discharge status: Home / Self Care | Payer: BLUE CROSS/BLUE SHIELD | Source: Ambulatory Visit | Attending: Internal Medicine | Admitting: Internal Medicine

## 2018-06-20 ENCOUNTER — Other Ambulatory Visit: Payer: Self-pay | Admitting: Internal Medicine

## 2018-06-20 ENCOUNTER — Encounter (INDEPENDENT_AMBULATORY_CARE_PROVIDER_SITE_OTHER): Payer: Self-pay

## 2018-06-20 DIAGNOSIS — R1013 Epigastric pain: Secondary | ICD-10-CM | POA: Diagnosis present

## 2018-06-20 DIAGNOSIS — K5903 Drug induced constipation: Secondary | ICD-10-CM

## 2018-06-20 DIAGNOSIS — Z8711 Personal history of peptic ulcer disease: Secondary | ICD-10-CM

## 2018-06-20 NOTE — Progress Notes (Signed)
Patient informed. Said she is still having extreme upper stomach pains and constipation. She said she only has a BM a few times a week and sometimes she has to take OTC meds to make her have a BM. She says after she passed the hard stool, then it turns to diarrhea. Informed her she may need to see GI specialist to determine what is going on. She agreed and wants to see GI here in [place] since she had colonoscopy with them here. Please Advise.

## 2018-06-29 ENCOUNTER — Encounter: Payer: Self-pay | Admitting: Gastroenterology

## 2018-06-29 ENCOUNTER — Ambulatory Visit (INDEPENDENT_AMBULATORY_CARE_PROVIDER_SITE_OTHER): Payer: BLUE CROSS/BLUE SHIELD | Admitting: Gastroenterology

## 2018-06-29 VITALS — BP 136/79 | HR 80 | Ht 66.0 in | Wt 220.0 lb

## 2018-06-29 DIAGNOSIS — K5903 Drug induced constipation: Secondary | ICD-10-CM

## 2018-06-29 DIAGNOSIS — R1084 Generalized abdominal pain: Secondary | ICD-10-CM

## 2018-06-29 DIAGNOSIS — R112 Nausea with vomiting, unspecified: Secondary | ICD-10-CM

## 2018-06-29 NOTE — Patient Instructions (Addendum)
You are scheduled for a HIDA scan at [facility] on Saturday, Feb 1st at 10:00am. Please arrive at 9:45am at the medical mall registration desk. You cannot have any pain medications, stomach meds or nausea meds 6 hours prior. You cannot have anything to eat or drink after midnight.   If you need to reschedule this appointment for any reason, please contact central scheduling at [phone].
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
## 2018-07-08 ENCOUNTER — Ambulatory Visit
Admission: RE | Admit: 2018-07-08 | Discharge: 2018-07-08 | Disposition: A | Discharge status: Home / Self Care | Payer: BLUE CROSS/BLUE SHIELD | Source: Ambulatory Visit | Attending: Gastroenterology | Admitting: Gastroenterology

## 2018-07-08 DIAGNOSIS — R112 Nausea with vomiting, unspecified: Secondary | ICD-10-CM | POA: Insufficient documentation

## 2018-07-08 MED ORDER — TECHNETIUM TC 99M MEBROFENIN IV KIT
5.1700 | PACK | Freq: Once | INTRAVENOUS | Status: AC | PRN
  Administered 2018-07-08: 5.17 via INTRAVENOUS

## 2018-07-10 ENCOUNTER — Other Ambulatory Visit: Payer: Self-pay

## 2018-07-10 ENCOUNTER — Telehealth: Payer: Self-pay

## 2018-07-10 MED ORDER — LUBIPROSTONE 8 MCG PO CAPS: 8.0000 ug | ORAL_CAPSULE | Freq: Two times a day (BID) | ORAL | 5 refills | Status: DC

## 2018-07-10 NOTE — Telephone Encounter (Signed)
-----   Message from Lucilla Lame, MD sent at 07/10/2018  8:14 AM EST ----- At the patient know that I am happy to report that her gallbladder is emptying at 60% with normal being anything greater than 33%.

## 2018-07-10 NOTE — Telephone Encounter (Signed)
Please let the patient know that if she is having gallbladder attacks despite her negative studies then she would need to be seen by surgery.  There is no medication or treatment for gallbladder attacks except for having the gallbladder removed.

## 2018-07-10 NOTE — Telephone Encounter (Signed)
Pt notified of [name] results.  Pt is wanting to know why she is having gallbladder attacks. Please advise. I told her the function was normal so I wasn't sure that she was having attacks. She was adamant that she was and wanted me to message you about it.

## 2018-07-10 NOTE — Progress Notes (Signed)
amitz

## 2018-07-11 ENCOUNTER — Other Ambulatory Visit: Payer: Self-pay

## 2018-07-11 NOTE — Telephone Encounter (Signed)
Pt notified of Dr. Dorothey Baseman recommendation. Pt stated she will try and eat the right foods to see if this will help her symptoms.

## 2018-07-21 ENCOUNTER — Other Ambulatory Visit: Payer: Self-pay | Admitting: Internal Medicine

## 2018-09-20 ENCOUNTER — Other Ambulatory Visit: Payer: Self-pay | Admitting: Internal Medicine

## 2018-09-20 DIAGNOSIS — Z79890 Hormone replacement therapy (postmenopausal): Secondary | ICD-10-CM

## 2018-10-10 ENCOUNTER — Other Ambulatory Visit: Payer: Self-pay

## 2018-10-10 MED ORDER — ALBUTEROL SULFATE HFA 108 (90 BASE) MCG/ACT IN AERS: 2.0000 | INHALATION_SPRAY | Freq: Four times a day (QID) | RESPIRATORY_TRACT | 5 refills | Status: DC | PRN

## 2018-10-12 ENCOUNTER — Other Ambulatory Visit: Payer: Self-pay | Admitting: Internal Medicine

## 2018-10-12 DIAGNOSIS — Z79890 Hormone replacement therapy (postmenopausal): Secondary | ICD-10-CM

## 2018-11-13 NOTE — Progress Notes (Signed)
Date:  11/14/2018   Name:  Alicia Nichols   DOB:  Jul 18, 1960   MRN:  [id number]   Chief Complaint: Annual Exam (Breast Exam. ) Alicia Nichols is a 58 y.o. female who presents today for her Complete Annual Exam. She feels fairly well. She reports exercising very little. She reports she is sleeping fairly well. She denies any breast issues.  Mammogram 08/2017 - done at [facility] Colonoscopy 12/2016  Hypertension  This is a chronic problem. The problem is controlled. Pertinent negatives include no chest pain, headaches, palpitations or shortness of breath. Past treatments include ACE inhibitors and diuretics.  Migraine   This is a recurrent problem. The problem occurs monthly. The problem has been unchanged. Associated symptoms include back pain (much improved after ESI last week). Pertinent negatives include no abdominal pain, coughing, dizziness, fever, hearing loss, tinnitus or vomiting. She has tried triptans for the symptoms. The treatment provided significant relief. Her past medical history is significant for hypertension.  Asthma  There is no cough, shortness of breath or wheezing. This is a recurrent problem. Pertinent negatives include no chest pain, fever, headaches or trouble swallowing. Her symptoms are aggravated by pollen. Her symptoms are alleviated by beta-agonist. Her past medical history is significant for asthma.  Constipation  This is a chronic problem. The problem is unchanged. Associated symptoms include back pain (much improved after ESI last week). Pertinent negatives include no abdominal pain, diarrhea, fever or vomiting. Risk factors include change in medication usage/dosage. Treatments tried: Amitiza.  Fibromyalgia - followed by pain management; currently taking Lyrica and Robaxin. Back pain - recent ESI has helped tremendously.  Lab Results  Component Value Date   CREATININE 0.91 11/10/2017   BUN 11 11/10/2017   NA 136 11/10/2017   K 4.0 11/10/2017   CL 97 11/10/2017   CO2 24 11/10/2017   Lab Results  Component Value Date   CHOL 259 (H) 11/10/2017   HDL 73 11/10/2017   LDLCALC 162 (H) 11/10/2017   TRIG 119 11/10/2017   CHOLHDL 3.5 11/10/2017   Lab Results  Component Value Date   TSH 1.710 11/10/2017     Review of Systems  Constitutional: Negative for chills, fatigue and fever.  HENT: Negative for congestion, hearing loss, tinnitus, trouble swallowing and voice change.   Eyes: Positive for visual disturbance (planning cataract surgery).  Respiratory: Negative for cough, chest tightness, shortness of breath and wheezing.   Cardiovascular: Negative for chest pain, palpitations and leg swelling.  Gastrointestinal: Positive for constipation. Negative for abdominal pain, diarrhea and vomiting.  Endocrine: Negative for polydipsia and polyuria.  Genitourinary: Negative for dysuria, frequency, genital sores, vaginal bleeding and vaginal discharge.  Musculoskeletal: Positive for back pain (much improved after ESI last week). Negative for arthralgias, gait problem and joint swelling.  Skin: Negative for color change and rash.  Neurological: Negative for dizziness, tremors, light-headedness and headaches.  Hematological: Negative for adenopathy. Does not bruise/bleed easily.  Psychiatric/Behavioral: Negative for dysphoric mood and sleep disturbance. The patient is not nervous/anxious.     Patient Active Problem List   Diagnosis Date Noted  . Epigastric pain 05/16/2018  . Drug-induced constipation 11/10/2017  . Polyp of sigmoid colon   . Hyperlipidemia, mild 11/10/2016  . ACL (anterior cruciate ligament) tear 10/21/2015  . Rosacea 10/21/2015  . Fibrocystic breast changes 06/06/2015  . Essential (primary) hypertension 12/03/2014  . Urge incontinence 12/03/2014  . H/O peptic ulcer 12/03/2014  . Fibromyalgia 12/03/2014  . Migraine without aura and  without status migrainosus, not intractable 12/03/2014  . Clinical depression 12/03/2014  . Mild  intermittent asthma without complication 75/64/3329  . Hot flash, menopausal 12/03/2014  . Idiopathic insomnia 12/03/2014  . Family history of breast cancer 12/03/2014    Allergies  Allergen Reactions  . Aspirin Shortness Of Breath  . Hydrocodone Shortness Of Breath  . Bupropion Hcl     intolerance  . Codeine Itching    Other reaction(s): Rash  . Divalproex Sodium Er Nausea And Vomiting  . Duloxetine Hcl     intolerance  . Escitalopram     intolerance  . Morphine Sulfate Itching  . Naproxen     delusions  . Ampicillin Rash  . Oxybutynin Other (See Comments)    Headache  . Sulfa Antibiotics Rash    Past Surgical History:  Procedure Laterality Date  . arm surgery Left 2018   reduction osteotomy  . BACK SURGERY    . COLONOSCOPY WITH PROPOFOL N/A 12/16/2016   Procedure: COLONOSCOPY WITH PROPOFOL;  Surgeon: Lucilla Lame, MD;  Location: [facility];  Service: Endoscopy;  Laterality: N/A;  . HAND SURGERY Left 2008   trauma surgery after dog bite  . KNEE ARTHROSCOPY    . LAMINECTOMY  1992  . MENISCUS REPAIR Right 01/2014  . POLYPECTOMY  12/16/2016   Procedure: POLYPECTOMY INTESTINAL;  Surgeon: Lucilla Lame, MD;  Location: [facility];  Service: Endoscopy;;  . TOTAL ABDOMINAL HYSTERECTOMY  2011    Social History   Tobacco Use  . Smoking status: Former Research scientist (life sciences)  . Smokeless tobacco: Never Used  Substance Use Topics  . Alcohol use: Yes    Alcohol/week: 3.0 standard drinks    Types: 2 Standard drinks or equivalent, 1 Glasses of wine per week  . Drug use: No     Medication list has been reviewed and updated.  Current Meds  Medication Sig  . albuterol (VENTOLIN HFA) 108 (90 Base) MCG/ACT inhaler Inhale 2 puffs into the lungs every 6 (six) hours as needed for wheezing or shortness of breath.  . Chlorzoxazone (LORZONE) 375 MG TABS Take 1 tablet by mouth daily.   Marland Kitchen Clocortolone Pivalate (CLODERM) 0.1 % cream Apply 1 application topically 2 (two) times daily.   . diphenhydrAMINE (SOMINEX) 25 MG tablet Take 25 mg by mouth at bedtime as needed for sleep (benadryl).   . Doxepin HCl 5 % CREA Apply topically.  Marland Kitchen estradiol (VIVELLE-DOT) 0.05 MG/24HR patch PLACE 1 PATCH (0.05 MG TOTAL) ONTO THE SKIN 2 (TWO) TIMES A WEEK.  Marland Kitchen lidocaine (XYLOCAINE) 5 % ointment APPLY 2-3 GRAMS (1 GRAM=1 INCH) TO AFFECTED AREA 4 TIMES DAILY  . lisinopril-hydrochlorothiazide (ZESTORETIC) 20-12.5 MG tablet Take 1 tablet by mouth daily.  Marland Kitchen lubiprostone (AMITIZA) 8 MCG capsule Take 1 capsule (8 mcg total) by mouth 2 (two) times daily with a meal.  . methocarbamol (ROBAXIN) 500 MG tablet Take 1 tablet by mouth 3 (three) times daily.  . NONFORMULARY OR COMPOUNDED ITEM Apply 1 application topically 4 (four) times daily.  Marland Kitchen oxyCODONE-acetaminophen (PERCOCET) 7.5-325 MG per tablet Take 1 tablet by mouth 3 (three) times daily. Pt taking 8 times each day due to surgery  . SUMAtriptan (IMITREX) 100 MG tablet Take 1 tablet (100 mg total) by mouth daily as needed.  . [DISCONTINUED] lisinopril-hydrochlorothiazide (PRINZIDE,ZESTORETIC) 20-12.5 MG tablet Take 1 tablet by mouth daily.  . [DISCONTINUED] SUMAtriptan (IMITREX) 100 MG tablet TAKE 1 TABLET (100 MG TOTAL) BY MOUTH DAILY AS NEEDED.    PHQ 2/9 Scores 11/14/2018  11/10/2017 11/04/2015  PHQ - 2 Score 0 0 0    BP Readings from Last 3 Encounters:  11/14/18 118/68  06/29/18 136/79  05/16/18 120/78    Physical Exam Vitals signs and nursing note reviewed.  Constitutional:      General: She is not in acute distress.    Appearance: She is well-developed.  HENT:     Head: Normocephalic and atraumatic.     Right Ear: Tympanic membrane and ear canal normal.     Left Ear: Tympanic membrane and ear canal normal.     Nose:     Right Sinus: No maxillary sinus tenderness.     Left Sinus: No maxillary sinus tenderness.     Mouth/Throat:     Pharynx: Uvula midline.  Eyes:     General: No scleral icterus.       Right eye: No discharge.         Left eye: No discharge.     Conjunctiva/sclera: Conjunctivae normal.  Neck:     Musculoskeletal: Normal range of motion. No erythema.     Thyroid: No thyromegaly.     Vascular: No carotid bruit.  Cardiovascular:     Rate and Rhythm: Normal rate and regular rhythm.     Pulses: Normal pulses.     Heart sounds: Normal heart sounds.  Pulmonary:     Effort: Pulmonary effort is normal. No respiratory distress.     Breath sounds: No wheezing.  Chest:     Breasts:        Right: No mass, nipple discharge, skin change or tenderness.        Left: No mass, nipple discharge, skin change or tenderness.  Abdominal:     General: Bowel sounds are normal.     Palpations: Abdomen is soft.     Tenderness: There is no abdominal tenderness.  Musculoskeletal:     Lumbar back: She exhibits decreased range of motion.  Lymphadenopathy:     Cervical: No cervical adenopathy.  Skin:    General: Skin is warm and dry.     Findings: No rash.  Neurological:     Mental Status: She is alert and oriented to person, place, and time.     Cranial Nerves: No cranial nerve deficit.     Sensory: Sensation is intact. No sensory deficit.     Gait: Gait is intact.     Deep Tendon Reflexes: Reflexes are normal and symmetric.     Reflex Scores:      Patellar reflexes are 2+ on the right side and 2+ on the left side. Psychiatric:        Speech: Speech normal.        Behavior: Behavior normal.        Thought Content: Thought content normal.     Wt Readings from Last 3 Encounters:  11/14/18 213 lb (96.6 kg)  06/29/18 220 lb (99.8 kg)  05/16/18 213 lb (96.6 kg)    BP 118/68   Pulse 76   Ht 5\' 6"  (1.676 m)   Wt 213 lb (96.6 kg)   SpO2 97%   BMI 34.38 kg/m   Assessment and Plan: 1. Annual physical exam Encourage healthy diet and exercise - Lipid panel - POCT urinalysis dipstick  2. Encounter for screening mammogram for breast cancer To be scheduled at [facility]; Future  3.  Essential (primary) hypertension controlled - lisinopril-hydrochlorothiazide (ZESTORETIC) 20-12.5 MG tablet; Take 1 tablet by mouth daily.  Dispense:  90 tablet; Refill: 3 - CBC with Differential/Platelet - Comprehensive metabolic panel  4. Migraine without aura and without status migrainosus, not intractable Doing well - SUMAtriptan (IMITREX) 100 MG tablet; Take 1 tablet (100 mg total) by mouth daily as needed.  Dispense: 10 tablet; Refill: 3  5. Mild intermittent asthma without complication Controlled on albuterol prn  6. Fibromyalgia  7. Drug-induced constipation To call GI for refills - TSH   Partially dictated using Editor, commissioning. Any errors are unintentional.  Halina Maidens, MD [facility]  11/14/2018

## 2018-11-14 ENCOUNTER — Other Ambulatory Visit: Payer: Self-pay

## 2018-11-14 ENCOUNTER — Ambulatory Visit (INDEPENDENT_AMBULATORY_CARE_PROVIDER_SITE_OTHER): Payer: BC Managed Care – PPO | Admitting: Internal Medicine

## 2018-11-14 ENCOUNTER — Telehealth: Payer: Self-pay

## 2018-11-14 ENCOUNTER — Encounter: Payer: Self-pay | Admitting: Internal Medicine

## 2018-11-14 VITALS — BP 118/68 | HR 76 | Ht 66.0 in | Wt 213.0 lb

## 2018-11-14 DIAGNOSIS — I10 Essential (primary) hypertension: Secondary | ICD-10-CM

## 2018-11-14 DIAGNOSIS — J4520 Mild intermittent asthma, uncomplicated: Secondary | ICD-10-CM | POA: Diagnosis not present

## 2018-11-14 DIAGNOSIS — K5903 Drug induced constipation: Secondary | ICD-10-CM

## 2018-11-14 DIAGNOSIS — Z1231 Encounter for screening mammogram for malignant neoplasm of breast: Secondary | ICD-10-CM

## 2018-11-14 DIAGNOSIS — Z0000 Encounter for general adult medical examination without abnormal findings: Secondary | ICD-10-CM

## 2018-11-14 DIAGNOSIS — M797 Fibromyalgia: Secondary | ICD-10-CM

## 2018-11-14 DIAGNOSIS — G43009 Migraine without aura, not intractable, without status migrainosus: Secondary | ICD-10-CM | POA: Diagnosis not present

## 2018-11-14 LAB — POCT URINALYSIS DIPSTICK
Bilirubin, UA: NEGATIVE
Blood, UA: NEGATIVE
Glucose, UA: NEGATIVE
Ketones, UA: NEGATIVE
Nitrite, UA: NEGATIVE
Protein, UA: NEGATIVE
Spec Grav, UA: <=1.005 — AB (ref 1.010–1.025)
Urobilinogen, UA: 0.2 E.U./dL
pH, UA: 8 (ref 5.0–8.0)

## 2018-11-14 MED ORDER — LUBIPROSTONE 24 MCG PO CAPS: 24.0000 ug | ORAL_CAPSULE | Freq: Two times a day (BID) | ORAL | 6 refills | Status: DC

## 2018-11-14 MED ORDER — SUMATRIPTAN SUCCINATE 100 MG PO TABS: 100.0000 mg | ORAL_TABLET | Freq: Every day | ORAL | 3 refills | Status: DC | PRN

## 2018-11-14 MED ORDER — LISINOPRIL-HYDROCHLOROTHIAZIDE 20-12.5 MG PO TABS: 1.0000 | ORAL_TABLET | Freq: Every day | ORAL | 3 refills | Status: DC

## 2018-11-14 NOTE — Telephone Encounter (Signed)
We can do 24 mcg bid.

## 2018-11-14 NOTE — Telephone Encounter (Signed)
Pt is requesting an increase in her Amitiza. She is currently taking 93mcg BID. Please advise if she will need a follow up appt or ok to increase.

## 2018-11-15 ENCOUNTER — Other Ambulatory Visit: Payer: Self-pay | Admitting: Internal Medicine

## 2018-11-15 DIAGNOSIS — B370 Candidal stomatitis: Secondary | ICD-10-CM

## 2018-11-15 LAB — CBC WITH DIFFERENTIAL/PLATELET
Basophils Absolute: 0 10*3/uL (ref 0.0–0.2)
Basos: 0 %
EOS (ABSOLUTE): 0 10*3/uL (ref 0.0–0.4)
Eos: 0 %
Hematocrit: 37.9 % (ref 34.0–46.6)
Hemoglobin: 12.7 g/dL (ref 11.1–15.9)
Immature Grans (Abs): 0 10*3/uL (ref 0.0–0.1)
Immature Granulocytes: 0 %
Lymphocytes Absolute: 1.3 10*3/uL (ref 0.7–3.1)
Lymphs: 22 %
MCH: 28.7 pg (ref 26.6–33.0)
MCHC: 33.5 g/dL (ref 31.5–35.7)
MCV: 86 fL (ref 79–97)
Monocytes Absolute: 0.5 10*3/uL (ref 0.1–0.9)
Monocytes: 9 %
Neutrophils Absolute: 4 10*3/uL (ref 1.4–7.0)
Neutrophils: 69 %
Platelets: 385 10*3/uL (ref 150–450)
RBC: 4.42 x10E6/uL (ref 3.77–5.28)
RDW: 13.1 % (ref 11.7–15.4)
WBC: 5.9 10*3/uL (ref 3.4–10.8)

## 2018-11-15 LAB — COMPREHENSIVE METABOLIC PANEL
ALT: 13 IU/L (ref 0–32)
AST: 15 IU/L (ref 0–40)
Albumin/Globulin Ratio: 1.9 (ref 1.2–2.2)
Albumin: 4.6 g/dL (ref 3.8–4.9)
Alkaline Phosphatase: 87 IU/L (ref 39–117)
BUN/Creatinine Ratio: 18 (ref 9–23)
BUN: 15 mg/dL (ref 6–24)
Bilirubin Total: 0.4 mg/dL (ref 0.0–1.2)
CO2: 22 mmol/L (ref 20–29)
Calcium: 9.6 mg/dL (ref 8.7–10.2)
Chloride: 97 mmol/L (ref 96–106)
Creatinine, Ser: 0.82 mg/dL (ref 0.57–1.00)
GFR calc Af Amer: 93 mL/min/{1.73_m2} (ref >59)
GFR calc non Af Amer: 80 mL/min/{1.73_m2} (ref >59)
Globulin, Total: 2.4 g/dL (ref 1.5–4.5)
Glucose: 98 mg/dL (ref 65–99)
Potassium: 4.4 mmol/L (ref 3.5–5.2)
Sodium: 136 mmol/L (ref 134–144)
Total Protein: 7 g/dL (ref 6.0–8.5)

## 2018-11-15 LAB — LIPID PANEL
Chol/HDL Ratio: 3.4 ratio (ref 0.0–4.4)
Cholesterol, Total: 242 mg/dL — ABNORMAL HIGH (ref 100–199)
HDL: 71 mg/dL (ref >39)
LDL Calculated: 151 mg/dL — ABNORMAL HIGH (ref 0–99)
Triglycerides: 100 mg/dL (ref 0–149)
VLDL Cholesterol Cal: 20 mg/dL (ref 5–40)

## 2018-11-15 LAB — TSH: TSH: 2.3 u[IU]/mL (ref 0.450–4.500)

## 2018-11-15 MED ORDER — CLOTRIMAZOLE 10 MG MT TROC: 10.0000 mg | Freq: Every day | OROMUCOSAL | 0 refills | Status: DC

## 2018-11-15 NOTE — Telephone Encounter (Signed)
Pt notified we can switch Amitiza to 38mcg. Samples provided.

## 2019-02-02 IMAGING — NM NM HEPATO W/GB/PHARM/[PERSON_NAME]
2 series · 12 of 12 positions shown · non-contrast
Comparison: Ultrasound 06/20/2018

CLINICAL DATA: Right upper quadrant abdominal pain

EXAM:
NUCLEAR MEDICINE HEPATOBILIARY IMAGING WITH GALLBLADDER EF
TECHNIQUE: Sequential images of the abdomen were obtained [DATE] minutes
following intravenous administration of radiopharmaceutical. After
oral ingestion of Ensure, gallbladder ejection fraction was
determined. At 60 min, normal ejection fraction is greater than 33%.
RADIOPHARMACEUTICALS:  5.17 mCi Ec-UUm  Choletec IV

[Series 1000: hepatobiliary scan · 9.59mm/px · 6 of 60 frames shown]
[frame 6/60]
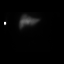
[frame 16/60]
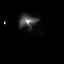
[frame 26/60]
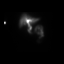
[frame 36/60]
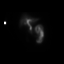
[frame 46/60]
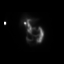
[frame 56/60]
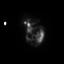

[Series 1000: gallbladder ef · 4.80mm/px · 6 of 120 frames shown]
[frame 11/120]
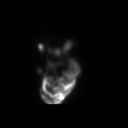
[frame 31/120]
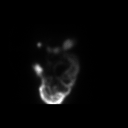
[frame 51/120]
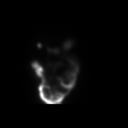
[frame 71/120]
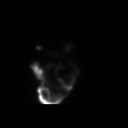
[frame 91/120]
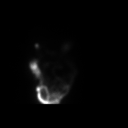
[frame 111/120]
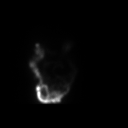

[12 of 12 positions shown; findings below may reference images not displayed]

FINDINGS: Prompt uptake and biliary excretion of activity by the liver is
seen. Gallbladder activity is visualized, consistent with patency of
cystic duct. Biliary activity passes into small bowel, consistent
with patent common bile duct.

Calculated gallbladder ejection fraction is 60%%. (Normal
gallbladder ejection fraction with Ensure is greater than 33%.)
IMPRESSION: Negative examination

## 2019-03-16 ENCOUNTER — Telehealth: Payer: Self-pay

## 2019-03-16 ENCOUNTER — Other Ambulatory Visit: Payer: Self-pay | Admitting: Internal Medicine

## 2019-03-16 DIAGNOSIS — Z8711 Personal history of peptic ulcer disease: Secondary | ICD-10-CM

## 2019-03-16 MED ORDER — OMEPRAZOLE 20 MG PO CPDR: 20.0000 mg | DELAYED_RELEASE_CAPSULE | Freq: Every day | ORAL | 3 refills | Status: DC

## 2019-03-16 NOTE — Telephone Encounter (Signed)
Has been on Prilosec and wants RX for 90 days Omeprazole sent to [facility]  cheaper this way.

## 2019-03-26 ENCOUNTER — Other Ambulatory Visit: Payer: Self-pay

## 2019-03-26 MED ORDER — PANTOPRAZOLE SODIUM 40 MG PO TBEC: 40.0000 mg | DELAYED_RELEASE_TABLET | Freq: Every day | ORAL | 1 refills | Status: DC

## 2019-04-09 ENCOUNTER — Other Ambulatory Visit: Payer: Self-pay

## 2019-04-09 ENCOUNTER — Encounter: Payer: Self-pay | Admitting: *Deleted

## 2019-04-12 ENCOUNTER — Other Ambulatory Visit
Admission: RE | Admit: 2019-04-12 | Discharge: 2019-04-12 | Disposition: A | Discharge status: Home / Self Care | Payer: BC Managed Care – PPO | Source: Ambulatory Visit | Attending: Ophthalmology | Admitting: Ophthalmology

## 2019-04-12 ENCOUNTER — Other Ambulatory Visit: Payer: Self-pay

## 2019-04-12 DIAGNOSIS — Z20828 Contact with and (suspected) exposure to other viral communicable diseases: Secondary | ICD-10-CM | POA: Diagnosis not present

## 2019-04-12 DIAGNOSIS — Z01812 Encounter for preprocedural laboratory examination: Secondary | ICD-10-CM | POA: Diagnosis not present

## 2019-04-12 LAB — SARS CORONAVIRUS 2 (TAT 6-24 HRS): SARS Coronavirus 2: NEGATIVE

## 2019-04-12 NOTE — Discharge Instructions (Signed)

## 2019-04-16 ENCOUNTER — Ambulatory Visit
Admission: RE | Admit: 2019-04-16 | Discharge: 2019-04-16 | Disposition: A | Discharge status: Home / Self Care | Payer: BC Managed Care – PPO | Attending: Ophthalmology | Admitting: Ophthalmology

## 2019-04-16 ENCOUNTER — Encounter
Admission: RE | Disposition: A | Discharge status: Home / Self Care | Payer: Self-pay | Source: Home / Self Care | Attending: Ophthalmology

## 2019-04-16 ENCOUNTER — Other Ambulatory Visit: Payer: Self-pay

## 2019-04-16 ENCOUNTER — Ambulatory Visit: Payer: BC Managed Care – PPO | Admitting: Anesthesiology

## 2019-04-16 DIAGNOSIS — Z79899 Other long term (current) drug therapy: Secondary | ICD-10-CM | POA: Insufficient documentation

## 2019-04-16 DIAGNOSIS — Z886 Allergy status to analgesic agent status: Secondary | ICD-10-CM | POA: Diagnosis not present

## 2019-04-16 DIAGNOSIS — Z885 Allergy status to narcotic agent status: Secondary | ICD-10-CM | POA: Insufficient documentation

## 2019-04-16 DIAGNOSIS — Z79891 Long term (current) use of opiate analgesic: Secondary | ICD-10-CM | POA: Insufficient documentation

## 2019-04-16 DIAGNOSIS — Z881 Allergy status to other antibiotic agents status: Secondary | ICD-10-CM | POA: Insufficient documentation

## 2019-04-16 DIAGNOSIS — H2511 Age-related nuclear cataract, right eye: Secondary | ICD-10-CM | POA: Insufficient documentation

## 2019-04-16 DIAGNOSIS — K219 Gastro-esophageal reflux disease without esophagitis: Secondary | ICD-10-CM | POA: Insufficient documentation

## 2019-04-16 DIAGNOSIS — Z87891 Personal history of nicotine dependence: Secondary | ICD-10-CM | POA: Insufficient documentation

## 2019-04-16 DIAGNOSIS — Z888 Allergy status to other drugs, medicaments and biological substances status: Secondary | ICD-10-CM | POA: Diagnosis not present

## 2019-04-16 DIAGNOSIS — I10 Essential (primary) hypertension: Secondary | ICD-10-CM | POA: Insufficient documentation

## 2019-04-16 DIAGNOSIS — J45909 Unspecified asthma, uncomplicated: Secondary | ICD-10-CM | POA: Diagnosis not present

## 2019-04-16 DIAGNOSIS — G43909 Migraine, unspecified, not intractable, without status migrainosus: Secondary | ICD-10-CM | POA: Diagnosis not present

## 2019-04-16 HISTORY — DX: Unspecified asthma, uncomplicated: J45.909

## 2019-04-16 HISTORY — DX: Gastro-esophageal reflux disease without esophagitis: K21.9

## 2019-04-16 HISTORY — DX: Motion sickness, initial encounter: T75.3XXA

## 2019-04-16 HISTORY — PX: CATARACT EXTRACTION W/PHACO: SHX586

## 2019-04-16 SURGERY — PHACOEMULSIFICATION, CATARACT, WITH IOL INSERTION
Anesthesia: Monitor Anesthesia Care | Site: Eye | Laterality: Right

## 2019-04-16 MED ORDER — FENTANYL CITRATE (PF) 100 MCG/2ML IJ SOLN
INTRAMUSCULAR | Status: DC | PRN
  Administered 2019-04-16: 50 ug via INTRAVENOUS

## 2019-04-16 MED ORDER — MOXIFLOXACIN HCL 0.5 % OP SOLN
OPHTHALMIC | Status: DC | PRN
  Administered 2019-04-16: 0.2 mL via OPHTHALMIC

## 2019-04-16 MED ORDER — ARMC OPHTHALMIC DILATING DROPS
1.0000 "application " | OPHTHALMIC | Status: DC | PRN
  Administered 2019-04-16 (×3): 1 via OPHTHALMIC

## 2019-04-16 MED ORDER — MIDAZOLAM HCL 2 MG/2ML IJ SOLN
INTRAMUSCULAR | Status: DC | PRN
  Administered 2019-04-16 (×2): 1 mg via INTRAVENOUS

## 2019-04-16 MED ORDER — EPINEPHRINE PF 1 MG/ML IJ SOLN
INTRAOCULAR | Status: DC | PRN
  Administered 2019-04-16: 68 mL via OPHTHALMIC

## 2019-04-16 MED ORDER — TETRACAINE HCL 0.5 % OP SOLN
1.0000 [drp] | OPHTHALMIC | Status: DC | PRN
  Administered 2019-04-16 (×3): 1 [drp] via OPHTHALMIC

## 2019-04-16 MED ORDER — LIDOCAINE HCL (PF) 2 % IJ SOLN
INTRAOCULAR | Status: DC | PRN
  Administered 2019-04-16: 1 mL via INTRAOCULAR

## 2019-04-16 MED ORDER — SODIUM HYALURONATE 23 MG/ML IO SOLN
INTRAOCULAR | Status: DC | PRN
  Administered 2019-04-16: 0.6 mL via INTRAOCULAR

## 2019-04-16 MED ORDER — SODIUM HYALURONATE 10 MG/ML IO SOLN
INTRAOCULAR | Status: DC | PRN
  Administered 2019-04-16: 0.55 mL via INTRAOCULAR

## 2019-04-16 MED ORDER — LACTATED RINGERS IV SOLN: INTRAVENOUS | Status: DC

## 2019-04-16 SURGICAL SUPPLY — 20 items
CANNULA ANT/CHMB 27G (MISCELLANEOUS) ×2 IMPLANT
CANNULA ANT/CHMB 27GA (MISCELLANEOUS) ×6 IMPLANT
CARTRIDGE ABBOTT (MISCELLANEOUS) ×2 IMPLANT
DISSECTOR HYDRO NUCLEUS 50X22 (MISCELLANEOUS) ×3 IMPLANT
GLOVE SURG LX 7.5 STRW (GLOVE) ×2
GLOVE SURG LX STRL 7.5 STRW (GLOVE) ×1 IMPLANT
GLOVE SURG SYN 8.5  E (GLOVE) ×2
GLOVE SURG SYN 8.5 E (GLOVE) ×1 IMPLANT
GLOVE SURG SYN 8.5 PF PI (GLOVE) ×1 IMPLANT
GOWN STRL REUS W/ TWL LRG LVL3 (GOWN DISPOSABLE) ×2 IMPLANT
GOWN STRL REUS W/TWL LRG LVL3 (GOWN DISPOSABLE) ×4
LENS IOL TECNIS SYMFONY 23.0 ×2 IMPLANT
MARKER SKIN DUAL TIP RULER LAB (MISCELLANEOUS) ×3 IMPLANT
PACK DR. KING ARMS (PACKS) ×3 IMPLANT
PACK EYE AFTER SURG (MISCELLANEOUS) ×3 IMPLANT
PACK OPTHALMIC (MISCELLANEOUS) ×3 IMPLANT
SYR 3ML LL SCALE MARK (SYRINGE) ×3 IMPLANT
SYR TB 1ML LUER SLIP (SYRINGE) ×3 IMPLANT
WATER STERILE IRR 250ML POUR (IV SOLUTION) ×3 IMPLANT
WIPE NON LINTING 3.25X3.25 (MISCELLANEOUS) ×3 IMPLANT

## 2019-04-16 NOTE — H&P (Signed)

## 2019-04-16 NOTE — Anesthesia Postprocedure Evaluation (Signed)
Anesthesia Post Note  Patient: Alicia Nichols  Procedure(s) Performed: CATARACT EXTRACTION PHACO AND INTRAOCULAR LENS PLACEMENT (IOC) RIGHT SYMFONY LENS 00:31.0  9.5%  3.03 (Right Eye)     Patient location during evaluation: PACU Anesthesia Type: MAC Level of consciousness: awake and alert Pain management: pain level controlled Vital Signs Assessment: post-procedure vital signs reviewed and stable Respiratory status: spontaneous breathing, nonlabored ventilation, respiratory function stable and patient connected to nasal cannula oxygen Cardiovascular status: stable and blood pressure returned to baseline Postop Assessment: no apparent nausea or vomiting Anesthetic complications: no    Adele Barthel Dylanie Quesenberry

## 2019-04-16 NOTE — Anesthesia Postprocedure Evaluation (Signed)
Anesthesia Post Note  Patient: Alicia Nichols  Procedure(s) Performed: CATARACT EXTRACTION PHACO AND INTRAOCULAR LENS PLACEMENT (IOC) RIGHT SYMFONY LENS (Right )  NAD, VSS, report and care to PACU RN   Anesthesia Post Evaluation  Vanetta Shawl

## 2019-04-16 NOTE — Anesthesia Preprocedure Evaluation (Signed)
Anesthesia Evaluation  Patient identified by MRN, date of birth, ID band Patient awake    History of Anesthesia Complications Negative for: history of anesthetic complications  Airway Mallampati: II  TM Distance: >3 FB Neck ROM: Full    Dental no notable dental hx.    Pulmonary asthma , former smoker,    Pulmonary exam normal        Cardiovascular Exercise Tolerance: Good hypertension, Pt. on medications Normal cardiovascular exam     Neuro/Psych    GI/Hepatic Neg liver ROS, GERD  Medicated,  Endo/Other  negative endocrine ROS  Renal/GU negative Renal ROS     Musculoskeletal   Abdominal   Peds  Hematology  (+) JEHOVAH'S WITNESS  Anesthesia Other Findings   Reproductive/Obstetrics                            Anesthesia Physical Anesthesia Plan  ASA: II  Anesthesia Plan: MAC   Post-op Pain Management:    Induction: Intravenous  PONV Risk Score and Plan: 2 and Midazolam, Treatment may vary due to age or medical condition and TIVA  Airway Management Planned: Nasal Cannula and Natural Airway  Additional Equipment: None  Intra-op Plan:   Post-operative Plan:   Informed Consent: I have reviewed the patients History and Physical, chart, labs and discussed the procedure including the risks, benefits and alternatives for the proposed anesthesia with the patient or authorized representative who has indicated his/her understanding and acceptance.       Plan Discussed with: CRNA  Anesthesia Plan Comments:         Anesthesia Quick Evaluation

## 2019-04-16 NOTE — Transfer of Care (Signed)
Immediate Anesthesia Transfer of Care Note  Patient: Alicia Nichols  Procedure(s) Performed: CATARACT EXTRACTION PHACO AND INTRAOCULAR LENS PLACEMENT (IOC) RIGHT SYMFONY LENS 00:31.0  9.5%  3.03 (Right Eye)  Patient Location: PACU  Anesthesia Type: MAC  Level of Consciousness: awake, alert  and patient cooperative  Airway and Oxygen Therapy: Patient Spontanous Breathing and Patient connected to supplemental oxygen  Post-op Assessment: Post-op Vital signs reviewed, Patient's Cardiovascular Status Stable, Respiratory Function Stable, Patent Airway and No signs of Nausea or vomiting  Post-op Vital Signs: Reviewed and stable  Complications: No apparent anesthesia complications

## 2019-04-16 NOTE — Op Note (Signed)
OPERATIVE NOTE  SYLIVIA LYON BE:[id number] 04/16/2019   PREOPERATIVE DIAGNOSIS:  Nuclear sclerotic cataract right eye.  H25.11   POSTOPERATIVE DIAGNOSIS:    Nuclear sclerotic cataract right eye.     PROCEDURE:  Phacoemusification with posterior chamber intraocular lens placement of the right eye   LENS:   Implant Name Type Inv. Item Serial No. Manufacturer Lot No. LRB No. Used Action  LENS IOL TECNIS SYMFONY 23.0 - [id number]  LENS IOL TECNIS SYMFONY 23.0 [id number] AMO  Right 1 Implanted       Procedure(s) with comments: CATARACT EXTRACTION PHACO AND INTRAOCULAR LENS PLACEMENT (IOC) RIGHT SYMFONY LENS 00:31.0  9.5%  3.03 (Right) - Latex  [id number] +23.0   ULTRASOUND TIME: 0 minutes 31 seconds.  CDE 3.03   SURGEON:  Benay Pillow, MD, MPH  ANESTHESIOLOGIST: Anesthesiologist: Page, Adele Barthel, MD CRNA: Vanetta Shawl, CRNA   ANESTHESIA:  Topical with tetracaine drops augmented with 1% preservative-free intracameral lidocaine.  ESTIMATED BLOOD LOSS: less than 1 mL.   COMPLICATIONS:  None.   DESCRIPTION OF PROCEDURE:  The patient was identified in the holding room and transported to the operating room and placed in the supine position under the operating microscope.  The right eye was identified as the operative eye and it was prepped and draped in the usual sterile ophthalmic fashion.   A 1.0 millimeter clear-corneal paracentesis was made at the 10:30 position. 0.5 ml of preservative-free 1% lidocaine with epinephrine was injected into the anterior chamber.  The anterior chamber was filled with Healon 5 viscoelastic.  A 2.4 millimeter keratome was used to make a near-clear corneal incision at the 8:00 position.  A curvilinear capsulorrhexis was made with a cystotome and capsulorrhexis forceps.  Balanced salt solution was used to hydrodissect and hydrodelineate the nucleus.   Phacoemulsification was then used in stop and chop fashion to remove the lens nucleus and epinucleus.  The  remaining cortex was then removed using the irrigation and aspiration handpiece.   Considerable effort was made to polish the posterior capsule, but there was some residual faint fibrosis which could not be safely removed.  Healon was then placed into the capsular bag to distend it for lens placement.  A lens was then injected into the capsular bag.  The remaining viscoelastic was aspirated.   Wounds were hydrated with balanced salt solution.  The anterior chamber was inflated to a physiologic pressure with balanced salt solution.   Intracameral vigamox 0.1 mL undiluted was injected into the eye and a drop placed onto the ocular surface.  No wound leaks were noted.  The patient was taken to the recovery room in stable condition without complications of anesthesia or surgery.  The patient complained at various times of pain with minimal manipulation.     Benay Pillow 04/16/2019, 9:06 AM

## 2019-04-17 ENCOUNTER — Encounter: Payer: Self-pay | Admitting: Ophthalmology

## 2019-05-09 ENCOUNTER — Encounter: Payer: Self-pay | Admitting: Internal Medicine

## 2019-05-09 ENCOUNTER — Ambulatory Visit (INDEPENDENT_AMBULATORY_CARE_PROVIDER_SITE_OTHER): Payer: BC Managed Care – PPO | Admitting: Internal Medicine

## 2019-05-09 ENCOUNTER — Other Ambulatory Visit: Payer: Self-pay

## 2019-05-09 VITALS — BP 118/78 | HR 76 | Ht 66.0 in | Wt 208.0 lb

## 2019-05-09 DIAGNOSIS — Z23 Encounter for immunization: Secondary | ICD-10-CM | POA: Diagnosis not present

## 2019-05-09 DIAGNOSIS — I10 Essential (primary) hypertension: Secondary | ICD-10-CM | POA: Diagnosis not present

## 2019-05-09 DIAGNOSIS — Z8711 Personal history of peptic ulcer disease: Secondary | ICD-10-CM

## 2019-05-09 NOTE — Progress Notes (Signed)
Date:  05/09/2019   Name:  Alicia Nichols   DOB:  1960/07/18   MRN:  [id number]   Chief Complaint: Hypertension  Hypertension This is a chronic problem. The problem is controlled. Pertinent negatives include no chest pain, headaches, palpitations or shortness of breath. Past treatments include ACE inhibitors and diuretics. The current treatment provides significant improvement. There are no compliance problems.    Cataract surgery - planned for 5 days from now.  She wants to get a flu vaccine but needs to be sure it is okay and will not delay surgery.  Lab Results  Component Value Date   CREATININE 0.82 11/14/2018   BUN 15 11/14/2018   NA 136 11/14/2018   K 4.4 11/14/2018   CL 97 11/14/2018   CO2 22 11/14/2018   Lab Results  Component Value Date   CHOL 242 (H) 11/14/2018   HDL 71 11/14/2018   LDLCALC 151 (H) 11/14/2018   TRIG 100 11/14/2018   CHOLHDL 3.4 11/14/2018   Lab Results  Component Value Date   TSH 2.300 11/14/2018   No results found for: HGBA1C   Review of Systems  Constitutional: Negative for chills, fatigue and fever.  HENT: Negative for trouble swallowing.   Eyes: Positive for visual disturbance.  Respiratory: Negative for cough, chest tightness, shortness of breath and wheezing.   Cardiovascular: Negative for chest pain and palpitations.  Gastrointestinal: Negative for abdominal pain, constipation and diarrhea.  Musculoskeletal: Positive for arthralgias, back pain and myalgias.  Neurological: Negative for dizziness, light-headedness and headaches.  Psychiatric/Behavioral: Negative for dysphoric mood and sleep disturbance. The patient is not nervous/anxious.     Patient Active Problem List   Diagnosis Date Noted  . Epigastric pain 05/16/2018  . Drug-induced constipation 11/10/2017  . Polyp of sigmoid colon   . Hyperlipidemia, mild 11/10/2016  . ACL (anterior cruciate ligament) tear 10/21/2015  . Rosacea 10/21/2015  . Fibrocystic breast changes  06/06/2015  . Essential (primary) hypertension 12/03/2014  . Urge incontinence 12/03/2014  . H/O peptic ulcer 12/03/2014  . Fibromyalgia 12/03/2014  . Migraine without aura and without status migrainosus, not intractable 12/03/2014  . Clinical depression 12/03/2014  . Mild intermittent asthma without complication [id number]  . Hot flash, menopausal 12/03/2014  . Idiopathic insomnia 12/03/2014  . Family history of breast cancer 12/03/2014    Allergies  Allergen Reactions  . Aspirin Shortness Of Breath  . Hydrocodone Shortness Of Breath  . Ambien [Zolpidem]     Suicidal, also caused vision issues  . Bupropion Hcl     intolerance  . Divalproex Sodium Er Nausea And Vomiting  . Duloxetine Hcl     intolerance  . Escitalopram     intolerance  . Morphine Sulfate Itching  . Naproxen     delusions  . Ampicillin Rash  . Codeine Itching and Rash       . Latex Rash    Gloves - when worn for extended period  . Oxybutynin Other (See Comments)    Headache  . Sulfa Antibiotics Rash    Past Surgical History:  Procedure Laterality Date  . arm surgery Left 2018   reduction osteotomy  . BACK SURGERY    . CATARACT EXTRACTION W/PHACO Right 04/16/2019   Procedure: CATARACT EXTRACTION PHACO AND INTRAOCULAR LENS PLACEMENT (IOC) RIGHT SYMFONY LENS 00:31.0  9.5%  3.03;  Surgeon: Eulogio Bear, MD;  Location: [place];  Service: Ophthalmology;  Laterality: Right;  Latex  . COLONOSCOPY WITH PROPOFOL N/A 12/16/2016  Procedure: COLONOSCOPY WITH PROPOFOL;  Surgeon: Lucilla Lame, MD;  Location: [place];  Service: Endoscopy;  Laterality: N/A;  . HAND SURGERY Left 2008   trauma surgery after dog bite  . KNEE ARTHROSCOPY    . LAMINECTOMY  1992  . MENISCUS REPAIR Right 01/2014  . POLYPECTOMY  12/16/2016   Procedure: POLYPECTOMY INTESTINAL;  Surgeon: Lucilla Lame, MD;  Location: [place];  Service: Endoscopy;;  . TOTAL ABDOMINAL HYSTERECTOMY  2011    Social  History   Tobacco Use  . Smoking status: Former Smoker    Years: 10.00    Types: Cigarettes    Quit date: 1988    Years since quitting: 32.9  . Smokeless tobacco: Never Used  Substance Use Topics  . Alcohol use: Yes    Alcohol/week: 0.0 standard drinks    Comment: may have a drink several times per year  . Drug use: No     Medication list has been reviewed and updated.  Current Meds  Medication Sig  . albuterol (VENTOLIN HFA) 108 (90 Base) MCG/ACT inhaler Inhale 2 puffs into the lungs every 6 (six) hours as needed for wheezing or shortness of breath.  . Chlorzoxazone (LORZONE) 375 MG TABS Take 1 tablet by mouth daily.   . diphenhydrAMINE (SOMINEX) 25 MG tablet Take 25 mg by mouth at bedtime as needed for sleep (benadryl).   . Doxepin HCl 5 % CREA Apply topically.  Marland Kitchen estradiol (VIVELLE-DOT) 0.05 MG/24HR patch PLACE 1 PATCH (0.05 MG TOTAL) ONTO THE SKIN 2 (TWO) TIMES A WEEK.  Marland Kitchen lidocaine (XYLOCAINE) 5 % ointment APPLY 2-3 GRAMS (1 GRAM=1 INCH) TO AFFECTED AREA 4 TIMES DAILY  . lisinopril-hydrochlorothiazide (ZESTORETIC) 20-12.5 MG tablet Take 1 tablet by mouth daily.  Marland Kitchen lubiprostone (AMITIZA) 8 MCG capsule Take 1 capsule (8 mcg total) by mouth 2 (two) times daily with a meal. (Patient taking differently: Take 8 mcg by mouth. Every 3 days)  . methocarbamol (ROBAXIN) 500 MG tablet Take 1 tablet by mouth 3 (three) times daily.  . NONFORMULARY OR COMPOUNDED ITEM Apply 1 application topically 4 (four) times daily.  Marland Kitchen oxyCODONE-acetaminophen (PERCOCET) 7.5-325 MG per tablet Take 1 tablet by mouth 3 (three) times daily. Pt taking 8 times each day due to surgery  . pantoprazole (PROTONIX) 40 MG tablet Take 1 tablet (40 mg total) by mouth daily.  . SUMAtriptan (IMITREX) 100 MG tablet Take 1 tablet (100 mg total) by mouth daily as needed.    PHQ 2/9 Scores 05/09/2019 11/14/2018 11/10/2017 11/04/2015  PHQ - 2 Score 1 0 0 0  PHQ- 9 Score 3 - - -    BP Readings from Last 3 Encounters:  05/09/19  118/78  04/16/19 133/82  11/14/18 118/68    Physical Exam Vitals signs and nursing note reviewed.  Constitutional:      General: She is not in acute distress.    Appearance: She is well-developed.  HENT:     Head: Normocephalic and atraumatic.  Cardiovascular:     Rate and Rhythm: Normal rate and regular rhythm.     Pulses: Normal pulses.  Pulmonary:     Effort: Pulmonary effort is normal. No respiratory distress.  Musculoskeletal:     Right knee: She exhibits decreased range of motion. She exhibits no effusion.     Right lower leg: No edema.     Left lower leg: No edema.  Lymphadenopathy:     Cervical: No cervical adenopathy.  Skin:    General: Skin is warm  and dry.     Findings: No rash.  Neurological:     Mental Status: She is alert and oriented to person, place, and time.  Psychiatric:        Behavior: Behavior normal.        Thought Content: Thought content normal.     Wt Readings from Last 3 Encounters:  05/09/19 208 lb (94.3 kg)  04/16/19 212 lb (96.2 kg)  11/14/18 213 lb (96.6 kg)    BP 118/78   Pulse 76   Ht 5\' 6"  (1.676 m)   Wt 208 lb (94.3 kg)   SpO2 98%   BMI 33.57 kg/m   Assessment and Plan: 1. Essential (primary) hypertension Clinically stable exam with well controlled BP.   Tolerating medications, lisinopril hct, without side effects at this time. Pt to continue current regimen and low sodium diet; benefits of regular exercise as able discussed.  2. H/O peptic ulcer Symptoms well controlled on daily PPI - taking OTC until new insurance starts and she can get pantoprazole tabs No red flag signs such as weight loss, n/v, melena Will continue daily.  3. Influenza vaccine needed - Flu Vaccine QUAD 6+ mos PF IM (Fluarix Quad PF)   Partially dictated using Editor, commissioning. Any errors are unintentional.  Halina Maidens, MD [facility]  05/09/2019

## 2019-05-10 ENCOUNTER — Other Ambulatory Visit
Admission: RE | Admit: 2019-05-10 | Discharge: 2019-05-10 | Disposition: A | Discharge status: Home / Self Care | Payer: BC Managed Care – PPO | Source: Ambulatory Visit | Attending: Gastroenterology | Admitting: Gastroenterology

## 2019-05-10 DIAGNOSIS — Z01812 Encounter for preprocedural laboratory examination: Secondary | ICD-10-CM | POA: Insufficient documentation

## 2019-05-10 DIAGNOSIS — Z20828 Contact with and (suspected) exposure to other viral communicable diseases: Secondary | ICD-10-CM | POA: Insufficient documentation

## 2019-05-11 LAB — SARS CORONAVIRUS 2 (TAT 6-24 HRS): SARS Coronavirus 2: NEGATIVE

## 2019-05-11 NOTE — Discharge Instructions (Signed)

## 2019-05-14 ENCOUNTER — Other Ambulatory Visit: Payer: Self-pay

## 2019-05-14 ENCOUNTER — Ambulatory Visit: Payer: BC Managed Care – PPO | Admitting: Anesthesiology

## 2019-05-14 ENCOUNTER — Encounter
Admission: RE | Disposition: A | Discharge status: Home / Self Care | Payer: Self-pay | Source: Home / Self Care | Attending: Ophthalmology

## 2019-05-14 ENCOUNTER — Ambulatory Visit
Admission: RE | Admit: 2019-05-14 | Discharge: 2019-05-14 | Disposition: A | Discharge status: Home / Self Care | Payer: BC Managed Care – PPO | Attending: Ophthalmology | Admitting: Ophthalmology

## 2019-05-14 DIAGNOSIS — J45909 Unspecified asthma, uncomplicated: Secondary | ICD-10-CM | POA: Diagnosis not present

## 2019-05-14 DIAGNOSIS — I10 Essential (primary) hypertension: Secondary | ICD-10-CM | POA: Diagnosis not present

## 2019-05-14 DIAGNOSIS — Z79899 Other long term (current) drug therapy: Secondary | ICD-10-CM | POA: Insufficient documentation

## 2019-05-14 DIAGNOSIS — G43909 Migraine, unspecified, not intractable, without status migrainosus: Secondary | ICD-10-CM | POA: Diagnosis not present

## 2019-05-14 DIAGNOSIS — H2512 Age-related nuclear cataract, left eye: Secondary | ICD-10-CM | POA: Insufficient documentation

## 2019-05-14 DIAGNOSIS — Z79891 Long term (current) use of opiate analgesic: Secondary | ICD-10-CM | POA: Diagnosis not present

## 2019-05-14 DIAGNOSIS — Z85828 Personal history of other malignant neoplasm of skin: Secondary | ICD-10-CM | POA: Insufficient documentation

## 2019-05-14 DIAGNOSIS — Z87891 Personal history of nicotine dependence: Secondary | ICD-10-CM | POA: Diagnosis not present

## 2019-05-14 HISTORY — PX: CATARACT EXTRACTION W/PHACO: SHX586

## 2019-05-14 SURGERY — PHACOEMULSIFICATION, CATARACT, WITH IOL INSERTION
Anesthesia: Monitor Anesthesia Care | Laterality: Left

## 2019-05-14 MED ORDER — MIDAZOLAM HCL 2 MG/2ML IJ SOLN
INTRAMUSCULAR | Status: DC | PRN
  Administered 2019-05-14: 2 mg via INTRAVENOUS
  Administered 2019-05-14: 1 mg via INTRAVENOUS

## 2019-05-14 MED ORDER — KETOROLAC TROMETHAMINE 15 MG/ML IJ SOLN: 15.0000 mg | Freq: Once | INTRAMUSCULAR | Status: DC

## 2019-05-14 MED ORDER — FENTANYL CITRATE (PF) 100 MCG/2ML IJ SOLN
INTRAMUSCULAR | Status: DC | PRN
  Administered 2019-05-14: 100 ug via INTRAVENOUS

## 2019-05-14 MED ORDER — TETRACAINE HCL 0.5 % OP SOLN
1.0000 [drp] | OPHTHALMIC | Status: DC | PRN
  Administered 2019-05-14 (×3): 1 [drp] via OPHTHALMIC

## 2019-05-14 MED ORDER — LIDOCAINE HCL (PF) 2 % IJ SOLN
INTRAOCULAR | Status: DC | PRN
  Administered 2019-05-14: 2 mL via INTRAOCULAR

## 2019-05-14 MED ORDER — ARMC OPHTHALMIC DILATING DROPS
1.0000 "application " | OPHTHALMIC | Status: DC | PRN
  Administered 2019-05-14 (×2): 1 via OPHTHALMIC

## 2019-05-14 MED ORDER — EPINEPHRINE PF 1 MG/ML IJ SOLN
INTRAOCULAR | Status: DC | PRN
  Administered 2019-05-14: 61 mL via OPHTHALMIC

## 2019-05-14 MED ORDER — ACETAMINOPHEN 325 MG PO TABS
650.0000 mg | ORAL_TABLET | Freq: Four times a day (QID) | ORAL | Status: DC | PRN
  Administered 2019-05-14: 650 mg via ORAL

## 2019-05-14 MED ORDER — ONDANSETRON HCL 4 MG/2ML IJ SOLN: 4.0000 mg | Freq: Once | INTRAMUSCULAR | Status: DC | PRN

## 2019-05-14 MED ORDER — SODIUM HYALURONATE 10 MG/ML IO SOLN
INTRAOCULAR | Status: DC | PRN
  Administered 2019-05-14: 0.55 mL via INTRAOCULAR

## 2019-05-14 MED ORDER — LACTATED RINGERS IV SOLN: 100.0000 mL/h | INTRAVENOUS | Status: DC

## 2019-05-14 MED ORDER — KETOROLAC TROMETHAMINE 30 MG/ML IJ SOLN
15.0000 mg | Freq: Once | INTRAMUSCULAR | Status: AC
  Administered 2019-05-14: 15 mg via INTRAVENOUS

## 2019-05-14 MED ORDER — SODIUM HYALURONATE 23 MG/ML IO SOLN
INTRAOCULAR | Status: DC | PRN
  Administered 2019-05-14: 0.6 mL via INTRAOCULAR

## 2019-05-14 MED ORDER — MOXIFLOXACIN HCL 0.5 % OP SOLN
OPHTHALMIC | Status: DC | PRN
  Administered 2019-05-14: 0.2 mL via OPHTHALMIC

## 2019-05-14 SURGICAL SUPPLY — 19 items
CANNULA ANT/CHMB 27G (MISCELLANEOUS) ×2 IMPLANT
CANNULA ANT/CHMB 27GA (MISCELLANEOUS) ×6 IMPLANT
DISSECTOR HYDRO NUCLEUS 50X22 (MISCELLANEOUS) ×3 IMPLANT
GLOVE SURG LX 7.5 STRW (GLOVE) ×2
GLOVE SURG LX STRL 7.5 STRW (GLOVE) ×1 IMPLANT
GLOVE SURG SYN 8.5  E (GLOVE) ×2
GLOVE SURG SYN 8.5 E (GLOVE) ×1 IMPLANT
GLOVE SURG SYN 8.5 PF PI (GLOVE) ×1 IMPLANT
GOWN STRL REUS W/ TWL LRG LVL3 (GOWN DISPOSABLE) ×2 IMPLANT
GOWN STRL REUS W/TWL LRG LVL3 (GOWN DISPOSABLE) ×4
LENS IOL TECNIS SYMFONY 23.0 ×2 IMPLANT
MARKER SKIN DUAL TIP RULER LAB (MISCELLANEOUS) ×3 IMPLANT
PACK DR. KING ARMS (PACKS) ×3 IMPLANT
PACK EYE AFTER SURG (MISCELLANEOUS) ×3 IMPLANT
PACK OPTHALMIC (MISCELLANEOUS) ×3 IMPLANT
SYR 3ML LL SCALE MARK (SYRINGE) ×3 IMPLANT
SYR TB 1ML LUER SLIP (SYRINGE) ×3 IMPLANT
WATER STERILE IRR 250ML POUR (IV SOLUTION) ×3 IMPLANT
WIPE NON LINTING 3.25X3.25 (MISCELLANEOUS) ×3 IMPLANT

## 2019-05-14 NOTE — H&P (Signed)

## 2019-05-14 NOTE — Anesthesia Postprocedure Evaluation (Signed)
Anesthesia Post Note  Patient: Alicia Nichols  Procedure(s) Performed: CATARACT EXTRACTION PHACO AND INTRAOCULAR LENS PLACEMENT (IOC) LEFT SYMFONY LENS, 0.25, 00:04.9 (Left )     Patient location during evaluation: PACU Anesthesia Type: MAC Level of consciousness: awake and alert Pain management: pain level controlled Vital Signs Assessment: post-procedure vital signs reviewed and stable Respiratory status: spontaneous breathing, nonlabored ventilation, respiratory function stable and patient connected to nasal cannula oxygen Cardiovascular status: stable and blood pressure returned to baseline Postop Assessment: no apparent nausea or vomiting Anesthetic complications: no    Adele Barthel Yuya Vanwingerden

## 2019-05-14 NOTE — Anesthesia Procedure Notes (Signed)
Procedure Name: MAC Date/Time: 05/14/2019 1:09 PM Performed by: Cameron Ali, CRNA Pre-anesthesia Checklist: Patient identified, Emergency Drugs available, Suction available, Timeout performed and Patient being monitored Patient Re-evaluated:Patient Re-evaluated prior to induction Oxygen Delivery Method: Nasal cannula Placement Confirmation: positive ETCO2

## 2019-05-14 NOTE — Transfer of Care (Signed)
Immediate Anesthesia Transfer of Care Note  Patient: Alicia Nichols  Procedure(s) Performed: CATARACT EXTRACTION PHACO AND INTRAOCULAR LENS PLACEMENT (IOC) LEFT SYMFONY LENS, 0.25, 00:04.9 (Left )  Patient Location: PACU  Anesthesia Type: MAC  Level of Consciousness: awake, alert  and patient cooperative  Airway and Oxygen Therapy: Patient Spontanous Breathing and Patient connected to supplemental oxygen  Post-op Assessment: Post-op Vital signs reviewed, Patient's Cardiovascular Status Stable, Respiratory Function Stable, Patent Airway and No signs of Nausea or vomiting  Post-op Vital Signs: Reviewed and stable  Complications: No apparent anesthesia complications

## 2019-05-14 NOTE — Op Note (Signed)
OPERATIVE NOTE  Alicia Nichols [id number] 05/14/2019   PREOPERATIVE DIAGNOSIS:  Nuclear sclerotic cataract left eye.  H25.12   POSTOPERATIVE DIAGNOSIS:    Nuclear sclerotic cataract left eye.     PROCEDURE:  Phacoemusification with posterior chamber intraocular lens placement of the left eye   LENS:   Implant Name Type Inv. Item Serial No. Manufacturer Lot No. LRB No. Used Action  Tecnis Symfony 23.0 D Intraocular Lens  [id number] [employer]  Left 1 Implanted      Procedure(s): CATARACT EXTRACTION PHACO AND INTRAOCULAR LENS PLACEMENT (IOC) LEFT SYMFONY LENS, 0.25, 00:04.9 (Left)  [id number] +23.0   ULTRASOUND TIME: 0 minutes 25 seconds.  CDE 0.049   SURGEON:  Benay Pillow, MD, MPH   ANESTHESIA:  Topical with tetracaine drops augmented with 1% preservative-free intracameral lidocaine.  ESTIMATED BLOOD LOSS: <1 mL   COMPLICATIONS:  None.   DESCRIPTION OF PROCEDURE:  The patient was identified in the holding room and transported to the operating room and placed in the supine position under the operating microscope.  The left eye was identified as the operative eye and it was prepped and draped in the usual sterile ophthalmic fashion.   A 1.0 millimeter clear-corneal paracentesis was made at the 5:00 position. 0.5 ml of preservative-free 1% lidocaine with epinephrine was injected into the anterior chamber.  The anterior chamber was filled with Healon 5 viscoelastic.  A 2.4 millimeter keratome was used to make a near-clear corneal incision at the 2:00 position.  A curvilinear capsulorrhexis was made with a cystotome and capsulorrhexis forceps.  Balanced salt solution was used to hydrodissect and hydrodelineate the nucleus.   Phacoemulsification was then used in stop and chop fashion to remove the lens nucleus and epinucleus.  The remaining cortex was then removed using the irrigation and aspiration handpiece. Healon was then placed into the capsular bag to distend it for lens  placement.  A lens was then injected into the capsular bag.  The remaining viscoelastic was aspirated.   Wounds were hydrated with balanced salt solution.  The anterior chamber was inflated to a physiologic pressure with balanced salt solution.  Intracameral vigamox 0.1 mL undiltued was injected into the eye and a drop placed onto the ocular surface.  No wound leaks were noted.  The patient was taken to the recovery room in stable condition without complications of anesthesia or surgery  Benay Pillow 05/14/2019, 1:21 PM

## 2019-05-14 NOTE — Anesthesia Preprocedure Evaluation (Signed)
Anesthesia Evaluation    Airway Mallampati: III  TM Distance: >3 FB Neck ROM: Full    Dental no notable dental hx.    Pulmonary asthma , former smoker,    Pulmonary exam normal        Cardiovascular hypertension, Normal cardiovascular exam     Neuro/Psych    GI/Hepatic GERD  Medicated,  Endo/Other    Renal/GU      Musculoskeletal  (+) Arthritis  (knee and back pain),   Abdominal   Peds  Hematology   Anesthesia Other Findings   Reproductive/Obstetrics                             Anesthesia Physical Anesthesia Plan  ASA: II  Anesthesia Plan: MAC   Post-op Pain Management:    Induction: Intravenous  PONV Risk Score and Plan: 2 and Midazolam and TIVA  Airway Management Planned: Nasal Cannula and Natural Airway  Additional Equipment: None  Intra-op Plan:   Post-operative Plan:   Informed Consent: I have reviewed the patients History and Physical, chart, labs and discussed the procedure including the risks, benefits and alternatives for the proposed anesthesia with the patient or authorized representative who has indicated his/her understanding and acceptance.       Plan Discussed with: CRNA  Anesthesia Plan Comments:         Anesthesia Quick Evaluation

## 2019-05-15 ENCOUNTER — Encounter: Payer: Self-pay | Admitting: Ophthalmology

## 2019-08-12 ENCOUNTER — Other Ambulatory Visit: Payer: Self-pay | Admitting: Internal Medicine

## 2019-08-12 DIAGNOSIS — G43009 Migraine without aura, not intractable, without status migrainosus: Secondary | ICD-10-CM

## 2019-09-11 ENCOUNTER — Other Ambulatory Visit: Payer: Self-pay | Admitting: Internal Medicine

## 2019-09-11 DIAGNOSIS — Z8711 Personal history of peptic ulcer disease: Secondary | ICD-10-CM

## 2019-09-14 ENCOUNTER — Other Ambulatory Visit: Payer: Self-pay | Admitting: Internal Medicine

## 2019-09-14 DIAGNOSIS — Z8711 Personal history of peptic ulcer disease: Secondary | ICD-10-CM

## 2019-09-14 MED ORDER — OMEPRAZOLE 20 MG PO CPDR: 20.0000 mg | DELAYED_RELEASE_CAPSULE | Freq: Every day | ORAL | 3 refills | Status: DC

## 2019-11-03 ENCOUNTER — Other Ambulatory Visit: Payer: Self-pay | Admitting: Internal Medicine

## 2019-11-03 DIAGNOSIS — I10 Essential (primary) hypertension: Secondary | ICD-10-CM

## 2019-11-06 ENCOUNTER — Ambulatory Visit: Payer: BC Managed Care – PPO

## 2019-11-13 ENCOUNTER — Ambulatory Visit: Payer: BC Managed Care – PPO | Attending: Internal Medicine

## 2019-11-13 DIAGNOSIS — Z23 Encounter for immunization: Secondary | ICD-10-CM

## 2019-11-13 NOTE — Progress Notes (Signed)
   Covid-19 Vaccination Clinic  Name:  CLOYCE PATERSON    MRN: [id number] DOB: 1961-04-29  11/13/2019  Ms. Frazee was observed post Covid-19 immunization for 30 minutes based on pre-vaccination screening .  During the observation period, she experienced an adverse reaction with the following symptoms:  " itching all-over". And dizzy.  Assessment : Time of assessment 1235. Alert and oriented and no visible rash or hives seen.  Actions taken:  Vitals sign taken  VAERS form obtained and completed by RN.  VS taken at 1235, BP- 99/63, P- 77, O2-100 %, patient reports that she took her own ClaritinD due to allergies at 1230 today. VS at 1240- BP- 125/85, she reports no further systems. Water given.    Medications administered: No medication administered.  Disposition: Reports no further symptoms of adverse reaction after observation for 30 minutes. Discharged home.   Immunizations Administered    Name Date Dose VIS Date Route   Pfizer COVID-19 Vaccine 11/13/2019 11:55 AM 0.3 mL 08/01/2018 Intramuscular   Manufacturer: [place]   Lot: [id number]   [place]: [id number]

## 2019-11-20 ENCOUNTER — Encounter: Payer: Self-pay | Admitting: Internal Medicine

## 2019-11-20 ENCOUNTER — Other Ambulatory Visit: Payer: Self-pay

## 2019-11-20 ENCOUNTER — Ambulatory Visit (INDEPENDENT_AMBULATORY_CARE_PROVIDER_SITE_OTHER): Payer: BC Managed Care – PPO | Admitting: Internal Medicine

## 2019-11-20 VITALS — BP 128/78 | HR 67 | Temp 97.9°F | Ht 66.0 in | Wt 210.0 lb

## 2019-11-20 DIAGNOSIS — I10 Essential (primary) hypertension: Secondary | ICD-10-CM | POA: Diagnosis not present

## 2019-11-20 DIAGNOSIS — Z1231 Encounter for screening mammogram for malignant neoplasm of breast: Secondary | ICD-10-CM | POA: Diagnosis not present

## 2019-11-20 DIAGNOSIS — M797 Fibromyalgia: Secondary | ICD-10-CM

## 2019-11-20 DIAGNOSIS — G43009 Migraine without aura, not intractable, without status migrainosus: Secondary | ICD-10-CM | POA: Diagnosis not present

## 2019-11-20 DIAGNOSIS — Z0000 Encounter for general adult medical examination without abnormal findings: Secondary | ICD-10-CM | POA: Diagnosis not present

## 2019-11-20 DIAGNOSIS — J4520 Mild intermittent asthma, uncomplicated: Secondary | ICD-10-CM | POA: Diagnosis not present

## 2019-11-20 DIAGNOSIS — E785 Hyperlipidemia, unspecified: Secondary | ICD-10-CM

## 2019-11-20 DIAGNOSIS — K5904 Chronic idiopathic constipation: Secondary | ICD-10-CM | POA: Insufficient documentation

## 2019-11-20 LAB — POCT URINALYSIS DIPSTICK
Bilirubin, UA: NEGATIVE
Blood, UA: NEGATIVE
Glucose, UA: NEGATIVE
Ketones, UA: NEGATIVE
Leukocytes, UA: NEGATIVE
Nitrite, UA: NEGATIVE
Protein, UA: NEGATIVE
Spec Grav, UA: <=1.005 — AB (ref 1.010–1.025)
Urobilinogen, UA: 0.2 E.U./dL
pH, UA: 7.5 (ref 5.0–8.0)

## 2019-11-20 MED ORDER — LISINOPRIL-HYDROCHLOROTHIAZIDE 20-12.5 MG PO TABS: 1.0000 | ORAL_TABLET | Freq: Every day | ORAL | 3 refills | Status: DC

## 2019-11-20 NOTE — Progress Notes (Signed)
Date:  11/20/2019   Name:  Alicia Nichols   DOB:  [birth year]   MRN:  [id number]   Chief Complaint: Annual Exam (breast exam/ no pap bcbs) Alicia Nichols is a 59 y.o. female who presents today for her Complete Annual Exam. She feels fairly well. She reports exercising none due to feet and leg pain. She reports she is sleeping fairly well. She denies breast issues.  Mammogram  10/2019 Pap discontinued Colonoscopy  12/2016 hyperplastic repeat 2028 Immunization History  Administered Date(s) Administered  . Influenza,inj,Quad PF,6+ Mos 05/09/2019  . PFIZER SARS-COV-2 Vaccination 11/13/2019  . Tdap 04/24/2013    Hypertension This is a chronic problem. The problem is controlled. Pertinent negatives include no chest pain, headaches, palpitations or shortness of breath. Past treatments include ACE inhibitors and diuretics. The current treatment provides significant improvement.  Migraine  This is a recurrent problem. The problem occurs intermittently. Pertinent negatives include no abdominal pain, coughing, dizziness, fever, hearing loss, tinnitus or vomiting. She has tried triptans for the symptoms. The treatment provided significant relief. Her past medical history is significant for hypertension.  Constipation This is a chronic problem. Her stool frequency is 1 time per week or less. The stool is described as pellet like and firm. The patient is on a high fiber diet. She does not exercise regularly. There has been adequate water intake. Pertinent negatives include no abdominal pain, diarrhea, fever or vomiting. Treatments tried: amitiza worked well at first for months but then caused severe diarrhea.  Fibromyalgia - she continues to suffer with joint pain and swelling, muscle pain and chronic fatigue.  She is seeing pain management and taking oxycodone, robaxin and applying a topical compounded medications with voltaren/baclofen/gabapentin. She cant take gabapenin orally but did well on brand name  Lyrica which her insurance will not cover.  Lab Results  Component Value Date   CREATININE 0.82 11/14/2018   BUN 15 11/14/2018   NA 136 11/14/2018   K 4.4 11/14/2018   CL 97 11/14/2018   CO2 22 11/14/2018   Lab Results  Component Value Date   CHOL 242 (H) 11/14/2018   HDL 71 11/14/2018   LDLCALC 151 (H) 11/14/2018   TRIG 100 11/14/2018   CHOLHDL 3.4 11/14/2018   Lab Results  Component Value Date   TSH 2.300 11/14/2018   No results found for: HGBA1C Lab Results  Component Value Date   WBC 5.9 11/14/2018   HGB 12.7 11/14/2018   HCT 37.9 11/14/2018   MCV 86 11/14/2018   PLT 385 11/14/2018   Lab Results  Component Value Date   ALT 13 11/14/2018   AST 15 11/14/2018   ALKPHOS 87 11/14/2018   BILITOT 0.4 11/14/2018     Review of Systems  Constitutional: Positive for fatigue. Negative for chills and fever.  HENT: Negative for congestion, hearing loss, tinnitus, trouble swallowing and voice change.   Eyes: Negative for visual disturbance.  Respiratory: Negative for cough, chest tightness, shortness of breath and wheezing.   Cardiovascular: Negative for chest pain, palpitations and leg swelling.  Gastrointestinal: Positive for constipation. Negative for abdominal pain, diarrhea and vomiting.  Endocrine: Negative for polydipsia and polyuria.  Genitourinary: Negative for dysuria, frequency, genital sores, vaginal bleeding and vaginal discharge.  Musculoskeletal: Positive for arthralgias, joint swelling and myalgias. Negative for gait problem.  Skin: Negative for color change and rash.  Neurological: Negative for dizziness, tremors, light-headedness and headaches.  Hematological: Negative for adenopathy. Does not bruise/bleed easily.  Psychiatric/Behavioral: Negative  for dysphoric mood and sleep disturbance. The patient is not nervous/anxious.     Patient Active Problem List   Diagnosis Date Noted  . Epigastric pain 05/16/2018  . Drug-induced constipation 11/10/2017  .  Polyp of sigmoid colon   . Hyperlipidemia, mild 11/10/2016  . ACL (anterior cruciate ligament) tear 10/21/2015  . Rosacea 10/21/2015  . Fibrocystic breast changes 06/06/2015  . Essential (primary) hypertension 12/03/2014  . Urge incontinence 12/03/2014  . H/O peptic ulcer 12/03/2014  . Fibromyalgia 12/03/2014  . Migraine without aura and without status migrainosus, not intractable 12/03/2014  . Clinical depression 12/03/2014  . Mild intermittent asthma without complication 16/03/9603  . Hot flash, menopausal 12/03/2014  . Idiopathic insomnia 12/03/2014  . Family history of breast cancer 12/03/2014    Allergies  Allergen Reactions  . Aspirin Shortness Of Breath  . Hydrocodone Shortness Of Breath  . Ambien [Zolpidem]     Suicidal, also caused vision issues  . Bupropion Hcl     intolerance  . Divalproex Sodium Er Nausea And Vomiting  . Duloxetine Hcl     intolerance  . Escitalopram     intolerance  . Morphine Sulfate Itching  . Naproxen     delusions  . Ampicillin Rash  . Codeine Itching and Rash       . Latex Rash    Gloves - when worn for extended period  . Oxybutynin Other (See Comments)    Headache  . Sulfa Antibiotics Rash    Past Surgical History:  Procedure Laterality Date  . arm surgery Left 2018   reduction osteotomy  . BACK SURGERY    . CATARACT EXTRACTION W/PHACO Right 04/16/2019   Procedure: CATARACT EXTRACTION PHACO AND INTRAOCULAR LENS PLACEMENT (IOC) RIGHT SYMFONY LENS 00:31.0  9.5%  3.03;  Surgeon: Eulogio Bear, MD;  Location: [facility];  Service: Ophthalmology;  Laterality: Right;  Latex  . CATARACT EXTRACTION W/PHACO Left 05/14/2019   Procedure: CATARACT EXTRACTION PHACO AND INTRAOCULAR LENS PLACEMENT (IOC) LEFT SYMFONY LENS, 0.25, 00:04.9;  Surgeon: Eulogio Bear, MD;  Location: [facility];  Service: Ophthalmology;  Laterality: Left;  . COLONOSCOPY WITH PROPOFOL N/A 12/16/2016   Procedure: COLONOSCOPY WITH PROPOFOL;   Surgeon: Lucilla Lame, MD;  Location: [facility];  Service: Endoscopy;  Laterality: N/A;  . HAND SURGERY Left 2008   trauma surgery after dog bite  . KNEE ARTHROSCOPY    . LAMINECTOMY  1992  . MENISCUS REPAIR Right 01/2014  . POLYPECTOMY  12/16/2016   Procedure: POLYPECTOMY INTESTINAL;  Surgeon: Lucilla Lame, MD;  Location: [facility];  Service: Endoscopy;;  . TOTAL ABDOMINAL HYSTERECTOMY  2011    Social History   Tobacco Use  . Smoking status: Former Smoker    Years: 10.00    Types: Cigarettes    Quit date: 1988    Years since quitting: 33.4  . Smokeless tobacco: Never Used  Vaping Use  . Vaping Use: Never used  Substance Use Topics  . Alcohol use: Yes    Alcohol/week: 0.0 standard drinks    Comment: may have a drink several times per year  . Drug use: No     Medication list has been reviewed and updated.  Current Meds  Medication Sig  . albuterol (VENTOLIN HFA) 108 (90 Base) MCG/ACT inhaler Inhale 2 puffs into the lungs every 6 (six) hours as needed for wheezing or shortness of breath.  . diclofenac Sodium (VOLTAREN) 1 % GEL Apply topically.  . diphenhydrAMINE ([place]) 25  MG tablet Take 25 mg by mouth at bedtime as needed for sleep (benadryl).   . Doxepin HCl 5 % CREA Apply topically.  Marland Kitchen lisinopril-hydrochlorothiazide (ZESTORETIC) 20-12.5 MG tablet TAKE 1 TABLET BY MOUTH EVERY DAY  . methocarbamol (ROBAXIN) 500 MG tablet Take 1 tablet by mouth 3 (three) times daily.  . NONFORMULARY OR COMPOUNDED ITEM Apply 1 gram (1 pump) to the affected area 4 times daily  . omeprazole (PRILOSEC) 20 MG capsule Take 1 capsule (20 mg total) by mouth daily.  . Probiotic Product (PROBIOTIC DAILY PO) Take by mouth daily.  . SUMAtriptan (IMITREX) 100 MG tablet TAKE 1 TABLET (100 MG TOTAL) BY MOUTH DAILY AS NEEDED.    PHQ 2/9 Scores 11/20/2019 05/09/2019 11/14/2018 11/10/2017  PHQ - 2 Score 0 1 0 0  PHQ- 9 Score 0 3 - -    GAD 7 : Generalized Anxiety Score 11/20/2019    Nervous, Anxious, on Edge 0  Control/stop worrying 0  Worry too much - different things 0  Trouble relaxing 0  Restless 0  Easily annoyed or irritable 0  Afraid - awful might happen 0  Total GAD 7 Score 0  Anxiety Difficulty Not difficult at all    BP Readings from Last 3 Encounters:  11/20/19 128/78  05/14/19 (!) 154/89  05/09/19 118/78    Physical Exam Vitals and nursing note reviewed.  Constitutional:      General: She is not in acute distress.    Appearance: She is well-developed.  HENT:     Head: Normocephalic and atraumatic.     Right Ear: Tympanic membrane and ear canal normal.     Left Ear: Tympanic membrane and ear canal normal.     Nose:     Right Sinus: No maxillary sinus tenderness.     Left Sinus: No maxillary sinus tenderness.  Eyes:     General: No scleral icterus.       Right eye: No discharge.        Left eye: No discharge.     Conjunctiva/sclera: Conjunctivae normal.  Neck:     Thyroid: No thyromegaly.     Vascular: No carotid bruit.  Cardiovascular:     Rate and Rhythm: Normal rate and regular rhythm.     Pulses: Normal pulses.     Heart sounds: Normal heart sounds.  Pulmonary:     Effort: Pulmonary effort is normal. No respiratory distress.     Breath sounds: No wheezing.  Chest:     Breasts:        Right: No mass, nipple discharge, skin change or tenderness.        Left: No mass, nipple discharge, skin change or tenderness.  Abdominal:     General: Bowel sounds are normal.     Palpations: Abdomen is soft.     Tenderness: There is no abdominal tenderness.  Musculoskeletal:        General: Tenderness present.     Cervical back: Normal range of motion. No erythema.     Right lower leg: Edema present.     Left lower leg: Edema present.  Lymphadenopathy:     Cervical: No cervical adenopathy.  Skin:    General: Skin is warm and dry.     Capillary Refill: Capillary refill takes less than 2 seconds.     Findings: No rash.  Neurological:      General: No focal deficit present.     Mental Status: She is alert and oriented to person, place,  and time.     Cranial Nerves: No cranial nerve deficit.     Sensory: No sensory deficit.     Deep Tendon Reflexes: Reflexes are normal and symmetric.  Psychiatric:        Mood and Affect: Mood normal.        Speech: Speech normal.     Wt Readings from Last 3 Encounters:  11/20/19 210 lb (95.3 kg)  05/14/19 209 lb (94.8 kg)  05/09/19 208 lb (94.3 kg)    BP 128/78   Pulse 67   Temp 97.9 F (36.6 C) (Oral)   Ht 5\' 6"  (1.676 m)   Wt 210 lb (95.3 kg)   SpO2 98%   BMI 33.89 kg/m   Assessment and Plan: 1. Annual physical exam Normal exam except for weight Unable to exercise - will need to make dietary changes - POCT urinalysis dipstick  2. Encounter for screening mammogram for breast cancer Due in November  3. Essential (primary) hypertension Clinically stable exam with well controlled BP. Tolerating medications without side effects at this time. Pt to continue current regimen and low sodium diet; benefits of regular exercise as able discussed. - CBC with Differential/Platelet - Comprehensive metabolic panel - TSH - lisinopril-hydrochlorothiazide (ZESTORETIC) 20-12.5 MG tablet; Take 1 tablet by mouth daily.  Dispense: 90 tablet; Refill: 3  4. Mild intermittent asthma without complication Asthma sx are stable.  Only using albuterol MDI several times per month  5. Migraine without aura and without status migrainosus, not intractable Migraines remain responsive to Imitrex  6. Hyperlipidemia, mild - Lipid panel  7. Fibromyalgia Under the care of pain management Appears to be having a flare up of sx currently   8. Chronic idiopathic constipation Has tried and failed Amitiza, LInzess, stool softeners and Miralax Colonoscopy was normal in the past 2 years She may benefit from GI evaluation     Partially dictated using Editor, commissioning. Any errors are  unintentional.  Halina Maidens, MD [facility]  11/20/2019

## 2019-11-21 LAB — COMPREHENSIVE METABOLIC PANEL
ALT: 10 IU/L (ref 0–32)
AST: 15 IU/L (ref 0–40)
Albumin/Globulin Ratio: 1.6 (ref 1.2–2.2)
Albumin: 4.4 g/dL (ref 3.8–4.9)
Alkaline Phosphatase: 93 IU/L (ref 48–121)
BUN/Creatinine Ratio: 16 (ref 9–23)
BUN: 13 mg/dL (ref 6–24)
Bilirubin Total: 0.3 mg/dL (ref 0.0–1.2)
CO2: 25 mmol/L (ref 20–29)
Calcium: 9.5 mg/dL (ref 8.7–10.2)
Chloride: 98 mmol/L (ref 96–106)
Creatinine, Ser: 0.79 mg/dL (ref 0.57–1.00)
GFR calc Af Amer: 96 mL/min/{1.73_m2} (ref >59)
GFR calc non Af Amer: 83 mL/min/{1.73_m2} (ref >59)
Globulin, Total: 2.8 g/dL (ref 1.5–4.5)
Glucose: 92 mg/dL (ref 65–99)
Potassium: 4.3 mmol/L (ref 3.5–5.2)
Sodium: 137 mmol/L (ref 134–144)
Total Protein: 7.2 g/dL (ref 6.0–8.5)

## 2019-11-21 LAB — CBC WITH DIFFERENTIAL/PLATELET
Basophils Absolute: 0 10*3/uL (ref 0.0–0.2)
Basos: 1 %
EOS (ABSOLUTE): 0.1 10*3/uL (ref 0.0–0.4)
Eos: 3 %
Hematocrit: 37.1 % (ref 34.0–46.6)
Hemoglobin: 12.3 g/dL (ref 11.1–15.9)
Immature Grans (Abs): 0 10*3/uL (ref 0.0–0.1)
Immature Granulocytes: 0 %
Lymphocytes Absolute: 1.3 10*3/uL (ref 0.7–3.1)
Lymphs: 36 %
MCH: 28.9 pg (ref 26.6–33.0)
MCHC: 33.2 g/dL (ref 31.5–35.7)
MCV: 87 fL (ref 79–97)
Monocytes Absolute: 0.4 10*3/uL (ref 0.1–0.9)
Monocytes: 10 %
Neutrophils Absolute: 1.8 10*3/uL (ref 1.4–7.0)
Neutrophils: 50 %
Platelets: 324 10*3/uL (ref 150–450)
RBC: 4.25 x10E6/uL (ref 3.77–5.28)
RDW: 12.7 % (ref 11.7–15.4)
WBC: 3.7 10*3/uL (ref 3.4–10.8)

## 2019-11-21 LAB — LIPID PANEL
Chol/HDL Ratio: 4.2 ratio (ref 0.0–4.4)
Cholesterol, Total: 248 mg/dL — ABNORMAL HIGH (ref 100–199)
HDL: 59 mg/dL (ref >39)
LDL Chol Calc (NIH): 161 mg/dL — ABNORMAL HIGH (ref 0–99)
Triglycerides: 155 mg/dL — ABNORMAL HIGH (ref 0–149)
VLDL Cholesterol Cal: 28 mg/dL (ref 5–40)

## 2019-11-21 LAB — TSH: TSH: 1.73 u[IU]/mL (ref 0.450–4.500)

## 2019-12-06 ENCOUNTER — Ambulatory Visit: Payer: Self-pay | Admitting: *Deleted

## 2019-12-06 ENCOUNTER — Ambulatory Visit: Payer: BC Managed Care – PPO

## 2019-12-06 ENCOUNTER — Telehealth: Payer: Self-pay | Admitting: Internal Medicine

## 2019-12-06 NOTE — Telephone Encounter (Signed)
Patient is calling to schedule OV for Bug Bite 2 weeks ago with extreme Fatigue and joint pain . Patient states that "it is on fire" First available OV is 12/18/19.   Patient did have her 2nd vaccine today. And states the joint pain has resumed today in her neck, back. Transferred patient to Nurse Triage. Please advise

## 2019-12-06 NOTE — Telephone Encounter (Signed)
C/o increase fatigue x 3 weeks after unknown insect or tick bite to left posterior leg .denies fever, SOB. Scab developed and area to left posterior leg bright pink with fluid . Patient scratched scab off and fluid drained. Unknown amt of fluid drained. Report of increased fatigue and pain in joints. Prescribed oxycodone not effective managing new increased pain. 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 Reports severe pain and joints ache. Appt. Made 12/18/19 for virtual visit. Care advise given. Patient verbalized understanding of care advise and to go to urgent care or ED if symptoms worsen.   Reason for Disposition . [1] Fatigue (i.e., tires easily, decreased energy) AND [2] persists > 1 week  Answer Assessment - Initial Assessment Questions 1. DESCRIPTION: "Describe how you are feeling."     Just received 2nd covid shot and feel increase intensity in pain in joints.  2. SEVERITY: "How bad is it?"  "Can you stand and walk?"   - MILD - Feels weak or tired, but does not interfere with work, school or normal activities   - [place] to stand and walk; weakness interferes with work, school, or normal activities   - SEVERE - Unable to stand or walk      Moderate, Increase intensity with pain  Increased fatigue x 3 weeks  3. ONSET:  "When did the weakness begin?"     3 weeks ago bit by unknown insect or tick and pain increasing since covid shot today  4. CAUSE: "What do you think is causing the weakness?"     Unsure . Possible tick bite x 3 weeks ago to left posterior thigh.   5. MEDICINES: "Have you recently started a new medicine or had a change in the amount of a medicine?"     No. Just received 2nd covid shot today  And immediately noted pain in joints  6. OTHER SYMPTOMS: "Do you have any other symptoms?" (e.g., chest pain, fever, cough, SOB, vomiting, diarrhea, bleeding, other areas of pain)     No  7. PREGNANCY: "Is there any chance you are pregnant?" "When was your last  menstrual period?"     No hysterectomy  Protocols used: WEAKNESS (GENERALIZED) AND FATIGUE-A-AH

## 2019-12-07 NOTE — Telephone Encounter (Signed)
Sent msg to front desk for them to let pt know that she should see UC instead of waiting until the 13th to be evaluated for these symptoms.  CM

## 2019-12-18 ENCOUNTER — Telehealth: Payer: BC Managed Care – PPO | Admitting: Internal Medicine

## 2020-01-25 ENCOUNTER — Ambulatory Visit
Admission: EM | Admit: 2020-01-25 | Discharge: 2020-01-25 | Discharge status: Against Medical Advice | Payer: BC Managed Care – PPO

## 2020-01-25 ENCOUNTER — Other Ambulatory Visit: Payer: Self-pay

## 2020-01-25 ENCOUNTER — Ambulatory Visit
Admission: EM | Admit: 2020-01-25 | Discharge: 2020-01-25 | Disposition: A | Discharge status: Home / Self Care | Payer: BC Managed Care – PPO | Attending: Family Medicine | Admitting: Family Medicine

## 2020-01-25 ENCOUNTER — Encounter: Payer: Self-pay | Admitting: Emergency Medicine

## 2020-01-25 DIAGNOSIS — R350 Frequency of micturition: Secondary | ICD-10-CM | POA: Diagnosis present

## 2020-01-25 DIAGNOSIS — R32 Unspecified urinary incontinence: Secondary | ICD-10-CM | POA: Insufficient documentation

## 2020-01-25 LAB — URINALYSIS, COMPLETE (UACMP) WITH MICROSCOPIC
Bilirubin Urine: NEGATIVE
Glucose, UA: NEGATIVE mg/dL
Ketones, ur: NEGATIVE mg/dL
Leukocytes,Ua: NEGATIVE
Nitrite: NEGATIVE
Protein, ur: NEGATIVE mg/dL
Specific Gravity, Urine: 1.015 (ref 1.005–1.030)
pH: 7 (ref 5.0–8.0)

## 2020-01-25 MED ORDER — NITROFURANTOIN MONOHYD MACRO 100 MG PO CAPS: 100.0000 mg | ORAL_CAPSULE | Freq: Two times a day (BID) | ORAL | 0 refills | Status: DC

## 2020-01-25 NOTE — Discharge Instructions (Signed)
I am sending your urine for a culture and we will call you if we need to change the antibiotic, or if it is negative for bacterial growth. If you get worse while on the antibiotics, make sure you are seen again, you may need more test done.

## 2020-01-25 NOTE — ED Provider Notes (Signed)
[facility]    CSN: [id number] Arrival date & time: 01/25/20  1837      History   Chief Complaint Chief Complaint  Patient presents with  . Abdominal Pain  . Back Pain    HPI Alicia Nichols is a 59 y.o. female who presents with worsening of urinary incontinence and frequency this week, but denies dysuria. Since this am has been having been  R flank pain and R mid abd pain. Denies fever or sweats. Her appetite has been down. Has chronic back pain and goes to a pain clinic. Does not recall when her last  Back MRI.   Past Medical History:  Diagnosis Date  . Anemia   . Asthma   . Family history of adverse reaction to anesthesia    sister awareness under anesthesia  . Fibromyalgia   . GERD (gastroesophageal reflux disease)   . Headache   . Hypertension   . Motion sickness    cars, roller coasters  . Osteoarthritis     Patient Active Problem List   Diagnosis Date Noted  . Chronic idiopathic constipation 11/20/2019  . Epigastric pain 05/16/2018  . Drug-induced constipation 11/10/2017  . Polyp of sigmoid colon   . Hyperlipidemia, mild 11/10/2016  . ACL (anterior cruciate ligament) tear 10/21/2015  . Rosacea 10/21/2015  . Fibrocystic breast changes 06/06/2015  . Essential (primary) hypertension 12/03/2014  . Urge incontinence 12/03/2014  . H/O peptic ulcer 12/03/2014  . Fibromyalgia 12/03/2014  . Migraine without aura and without status migrainosus, not intractable 12/03/2014  . Clinical depression 12/03/2014  . Mild intermittent asthma without complication 32/20/2542  . Hot flash, menopausal 12/03/2014  . Idiopathic insomnia 12/03/2014  . Family history of breast cancer 12/03/2014    Past Surgical History:  Procedure Laterality Date  . arm surgery Left 2018   reduction osteotomy  . BACK SURGERY    . CATARACT EXTRACTION W/PHACO Right 04/16/2019   Procedure: CATARACT EXTRACTION PHACO AND INTRAOCULAR LENS PLACEMENT (IOC) RIGHT SYMFONY LENS 00:31.0  9.5%   3.03;  Surgeon: Eulogio Bear, MD;  Location: [facility];  Service: Ophthalmology;  Laterality: Right;  Latex  . CATARACT EXTRACTION W/PHACO Left 05/14/2019   Procedure: CATARACT EXTRACTION PHACO AND INTRAOCULAR LENS PLACEMENT (IOC) LEFT SYMFONY LENS, 0.25, 00:04.9;  Surgeon: Eulogio Bear, MD;  Location: [place];  Service: Ophthalmology;  Laterality: Left;  . COLONOSCOPY WITH PROPOFOL N/A 12/16/2016   Procedure: COLONOSCOPY WITH PROPOFOL;  Surgeon: Lucilla Lame, MD;  Location: [place];  Service: Endoscopy;  Laterality: N/A;  . HAND SURGERY Left 2008   trauma surgery after dog bite  . KNEE ARTHROSCOPY    . LAMINECTOMY  1992  . MENISCUS REPAIR Right 01/2014  . POLYPECTOMY  12/16/2016   Procedure: POLYPECTOMY INTESTINAL;  Surgeon: Lucilla Lame, MD;  Location: [place];  Service: Endoscopy;;  . TOTAL ABDOMINAL HYSTERECTOMY  2011    OB History   No obstetric history on file.      Home Medications    Prior to Admission medications   Medication Sig Start Date End Date Taking? Authorizing Provider  diphenhydrAMINE (SOMINEX) 25 MG tablet Take 25 mg by mouth at bedtime as needed for sleep (benadryl).    Yes [provider]  lisinopril-hydrochlorothiazide (ZESTORETIC) 20-12.5 MG tablet Take 1 tablet by mouth daily. 11/20/19  Yes Glean Hess, MD  methocarbamol (ROBAXIN) 500 MG tablet Take 1 tablet by mouth 3 (three) times daily.   Yes [provider]  omeprazole (PRILOSEC) 20 MG capsule Take 1 capsule (20 mg total) by mouth daily. 09/14/19  Yes Glean Hess, MD  Probiotic Product (PROBIOTIC DAILY PO) Take by mouth daily.   Yes [provider]  SUMAtriptan (IMITREX) 100 MG tablet TAKE 1 TABLET (100 MG TOTAL) BY MOUTH DAILY AS NEEDED. 08/12/19  Yes Glean Hess, MD  albuterol (VENTOLIN HFA) 108 (90 Base) MCG/ACT inhaler Inhale 2 puffs into the lungs every 6 (six) hours as needed for wheezing or shortness of  breath. 10/10/18   Glean Hess, MD  Chlorzoxazone (LORZONE) 375 MG TABS Take 1 tablet by mouth daily.  Patient not taking: Reported on 11/20/2019    [provider]  diazepam (VALIUM) 5 MG tablet TAKE 1 TAB 30 MINS BEFORE MRI Patient not taking: Reported on 11/20/2019 05/19/19   [provider]  diclofenac Sodium (VOLTAREN) 1 % GEL Apply topically.    [provider]  Doxepin HCl 5 % CREA Apply topically.    [provider]  NONFORMULARY OR COMPOUNDED ITEM Apply 1 gram (1 pump) to the affected area 4 times daily 08/14/19   [provider]  oxyCODONE-acetaminophen (PERCOCET) 10-325 MG tablet Take 1-2 tablets by mouth every 4 (four) hours. 12/25/19   [provider]    Family History Family History  Problem Relation Age of Onset  . Rheum arthritis Mother   . Breast cancer Maternal Aunt   . Breast cancer Maternal Grandmother     Social History Social History   Tobacco Use  . Smoking status: Former Smoker    Years: 10.00    Types: Cigarettes    Quit date: 1988    Years since quitting: 33.6  . Smokeless tobacco: Never Used  Vaping Use  . Vaping Use: Never used  Substance Use Topics  . Alcohol use: Yes    Alcohol/week: 0.0 standard drinks    Comment: may have a drink several times per year  . Drug use: No     Allergies   Aspirin, Hydrocodone, Ambien [zolpidem], Bupropion hcl, Divalproex sodium er, Duloxetine hcl, Escitalopram, Morphine sulfate, Naproxen, Ampicillin, Codeine, Latex, Oxybutynin, and Sulfa antibiotics   Review of Systems Review of Systems  Constitutional: Positive for appetite change. Negative for chills, diaphoresis and fever.  Respiratory: Negative for cough.   Gastrointestinal: Positive for nausea. Negative for vomiting.       Denies bowel incontinence  Genitourinary: Positive for enuresis. Negative for decreased urine volume, dysuria and pelvic pain.       + incontinence  Musculoskeletal: Positive for  back pain. Negative for gait problem and myalgias.  Skin: Negative for rash.  Neurological: Negative for numbness.   Physical Exam Triage Vital Signs ED Triage Vitals  Enc Vitals Group     BP 01/25/20 1846 131/77     Pulse Rate 01/25/20 1846 65     Resp 01/25/20 1846 14     Temp 01/25/20 1846 98.4 F (36.9 C)     Temp Source 01/25/20 1846 Oral     SpO2 01/25/20 1846 100 %     Weight 01/25/20 1842 211 lb (95.7 kg)     Height 01/25/20 1842 5\' 6"  (1.676 m)     Head Circumference --      Peak Flow --      Pain Score 01/25/20 1842 8     Pain Loc --      Pain Edu? --      Excl. in GC? --    No  data found.  Updated Vital Signs BP 131/77 (BP Location: Right Arm)   Pulse 65   Temp 98.4 F (36.9 C) (Oral)   Resp 14   Ht 5\' 6"  (1.676 m)   Wt 211 lb (95.7 kg)   SpO2 100%   BMI 34.06 kg/m   Visual Acuity Right Eye Distance:   Left Eye Distance:   Bilateral Distance:    Right Eye Near:   Left Eye Near:    Bilateral Near:     Physical Exam Vitals and nursing note reviewed.  Constitutional:      General: She is not in acute distress.    Appearance: She is not toxic-appearing.  HENT:     Head: Atraumatic.  Eyes:     General: No scleral icterus.    Extraocular Movements: Extraocular movements intact.  Pulmonary:     Effort: Pulmonary effort is normal.  Abdominal:     General: Abdomen is protuberant. Bowel sounds are normal.     Palpations: Abdomen is soft. There is no hepatomegaly or splenomegaly.     Tenderness: There is no right CVA tenderness, left CVA tenderness, guarding or rebound. Negative signs include psoas sign.     Hernia: No hernia is present.     Comments: Has mild tenderness on R mid abdomen  Musculoskeletal:     Lumbar back: Tenderness present.       Back:     Comments: Has local tenderness on R mid back with palpation  Skin:    General: Skin is warm and dry.     Findings: No rash.  Neurological:     Mental Status: She is alert and oriented to  person, place, and time.  Psychiatric:        Mood and Affect: Mood normal.        Behavior: Behavior normal.    UC Treatments / Results  Labs (all labs ordered are listed, but only abnormal results are displayed) Labs Reviewed  URINALYSIS, COMPLETE (UACMP) WITH MICROSCOPIC    EKG   Radiology No results found.  Procedures Procedures (including critical care time)  Medications Ordered in UC Medications - No data to display  Initial Impression / Assessment and Plan / UC Course  I have reviewed the triage vital signs and the nursing notes. Pertinent labs  results that were available during my care of the patient were reviewed by me and considered in my medical decision making (see chart for details). Her urine has small bacterial and I dont suspect she has pyelo.  I sent the urine for a culture and placed her on Macrobid as noted in the mean time. If her urine culture comes back neg, she needs to address the incontinence with her pain clinic with whom she has apt next week.   Final Clinical Impressions(s) / UC Diagnoses   Final diagnoses:  None   Discharge Instructions   None    ED Prescriptions    None     PDMP not reviewed this encounter.   Shelby Mattocks, Hershal Coria 01/25/20 2013

## 2020-01-25 NOTE — ED Triage Notes (Signed)
Patient c/o right sided abdominal pain and lower back pain that this morning.  Patient reports urinary incontinence and urinary frequency.  Patient denies fevers.  Patient reports nausea.  Patient denies V/D.

## 2020-01-27 LAB — URINE CULTURE: Special Requests: NORMAL

## 2020-02-04 ENCOUNTER — Telehealth: Payer: Self-pay | Admitting: Internal Medicine

## 2020-02-04 ENCOUNTER — Ambulatory Visit (INDEPENDENT_AMBULATORY_CARE_PROVIDER_SITE_OTHER): Payer: BC Managed Care – PPO | Admitting: Internal Medicine

## 2020-02-04 ENCOUNTER — Other Ambulatory Visit: Payer: Self-pay | Admitting: Internal Medicine

## 2020-02-04 ENCOUNTER — Telehealth: Payer: Self-pay

## 2020-02-04 ENCOUNTER — Encounter: Payer: Self-pay | Admitting: Internal Medicine

## 2020-02-04 VITALS — BP 147/90 | Ht 66.0 in | Wt 211.0 lb

## 2020-02-04 DIAGNOSIS — J4520 Mild intermittent asthma, uncomplicated: Secondary | ICD-10-CM

## 2020-02-04 DIAGNOSIS — J40 Bronchitis, not specified as acute or chronic: Secondary | ICD-10-CM | POA: Diagnosis not present

## 2020-02-04 DIAGNOSIS — N3941 Urge incontinence: Secondary | ICD-10-CM

## 2020-02-04 MED ORDER — DOXYCYCLINE HYCLATE 100 MG PO TABS: 100.0000 mg | ORAL_TABLET | Freq: Two times a day (BID) | ORAL | 0 refills | Status: AC

## 2020-02-04 MED ORDER — PROMETHAZINE-DM 6.25-15 MG/5ML PO SYRP: 5.0000 mL | ORAL_SOLUTION | Freq: Four times a day (QID) | ORAL | 0 refills | Status: DC | PRN

## 2020-02-04 MED ORDER — ALBUTEROL SULFATE HFA 108 (90 BASE) MCG/ACT IN AERS: 2.0000 | INHALATION_SPRAY | Freq: Four times a day (QID) | RESPIRATORY_TRACT | 5 refills | Status: DC | PRN

## 2020-02-04 NOTE — Telephone Encounter (Signed)
This visit type is being conducted due to national recommendations for restrictions regarding the COVID- 19 Pandemic (e.g. social distancing) in effort to limit this patients exposure and mitigate transmission in our community. This visit type is felt to be most appropriate for this patient at this time. I connected with the patient today and received telephone consent from the patient and patient understand this consent will be good for 1 year.  KP

## 2020-02-04 NOTE — Telephone Encounter (Signed)
Copied from [place] [phone]. Topic: General - Inquiry >> Feb 04, 2020  4:08 PM Gillis Ends D wrote: Reason for CRM: Patient wanted to know if she needed a referral for a urologist. Please advise the patient.

## 2020-02-04 NOTE — Progress Notes (Signed)
Date:  02/04/2020   Name:  Alicia Nichols   DOB:  [birth year]   MRN:  [id number]   Consent for virtual care obtained by CMA [facility] Person. Virtual Visit via Video Note  I connected with Alicia Nichols on 02/04/20 at  3:40 PM EDT by a video enabled telemedicine application and verified that I am speaking with the correct person using two identifiers.  Location: Patient: home Provider: [facility]   I discussed the limitations of evaluation and management by telemedicine and the availability of in person appointments. The patient expressed understanding and agreed to proceed.   Follow Up Instructions:  I discussed the assessment and treatment plan with the patient. The patient was provided an opportunity to ask questions and all were answered. The patient agreed with the plan and demonstrated an understanding of the instructions.   The patient was advised to call back or seek an in-person evaluation if the symptoms worsen or if the condition fails to improve as anticipated.  I provided 13 minutes of non-face-to-face time during this encounter.   Halina Maidens, MD   Chief Complaint: Cough (X2-3 weeks, sore throat has gone away, dry cough, drainage down back of throat, no fever, no chills, wheezing when laying on side, feels pressure in heads,headache for about 7 days, feels it in chest )  Cough This is a new problem. The current episode started 1 to 4 weeks ago. The cough is non-productive. Associated symptoms include headaches, postnasal drip, a sore throat, shortness of breath (with exertions) and wheezing (when when lying down). Pertinent negatives include no chest pain, chills or fever. She has tried OTC cough suppressant, a beta-agonist inhaler and oral steroids for the symptoms. The treatment provided mild relief. Her past medical history is significant for asthma.   Immunization History  Administered Date(s) Administered  . Influenza,inj,Quad PF,6+ Mos 05/09/2019  . PFIZER  SARS-COV-2 Vaccination 11/13/2019, 12/06/2019  . Tdap 04/24/2013      Lab Results  Component Value Date   CREATININE 0.79 11/20/2019   BUN 13 11/20/2019   NA 137 11/20/2019   K 4.3 11/20/2019   CL 98 11/20/2019   CO2 25 11/20/2019   Lab Results  Component Value Date   CHOL 248 (H) 11/20/2019   HDL 59 11/20/2019   LDLCALC 161 (H) 11/20/2019   TRIG 155 (H) 11/20/2019   CHOLHDL 4.2 11/20/2019   Lab Results  Component Value Date   TSH 1.730 11/20/2019   No results found for: HGBA1C Lab Results  Component Value Date   WBC 3.7 11/20/2019   HGB 12.3 11/20/2019   HCT 37.1 11/20/2019   MCV 87 11/20/2019   PLT 324 11/20/2019   Lab Results  Component Value Date   ALT 10 11/20/2019   AST 15 11/20/2019   ALKPHOS 93 11/20/2019   BILITOT 0.3 11/20/2019     Review of Systems  Constitutional: Negative for chills and fever.  HENT: Positive for postnasal drip, sinus pressure and sore throat.   Respiratory: Positive for cough, shortness of breath (with exertions) and wheezing (when when lying down).   Cardiovascular: Negative for chest pain, palpitations and leg swelling.  Gastrointestinal: Negative for nausea and vomiting.  Neurological: Positive for headaches. Negative for weakness and light-headedness.    Patient Active Problem List   Diagnosis Date Noted  . Chronic idiopathic constipation 11/20/2019  . Epigastric pain 05/16/2018  . Drug-induced constipation 11/10/2017  . Polyp of sigmoid colon   . Hyperlipidemia, mild 11/10/2016  .  ACL (anterior cruciate ligament) tear 10/21/2015  . Rosacea 10/21/2015  . Fibrocystic breast changes 06/06/2015  . Essential (primary) hypertension 12/03/2014  . Urge incontinence 12/03/2014  . H/O peptic ulcer 12/03/2014  . Fibromyalgia 12/03/2014  . Migraine without aura and without status migrainosus, not intractable 12/03/2014  . Clinical depression 12/03/2014  . Mild intermittent asthma without complication 81/19/1478  . Hot  flash, menopausal 12/03/2014  . Idiopathic insomnia 12/03/2014  . Family history of breast cancer 12/03/2014    Allergies  Allergen Reactions  . Aspirin Shortness Of Breath  . Hydrocodone Shortness Of Breath  . Ambien [Zolpidem]     Suicidal, also caused vision issues  . Bupropion Hcl     intolerance  . Divalproex Sodium Er Nausea And Vomiting  . Duloxetine Hcl     intolerance  . Escitalopram     intolerance  . Morphine Sulfate Itching  . Naproxen     delusions  . Ampicillin Rash  . Codeine Itching and Rash       . Latex Rash    Gloves - when worn for extended period  . Oxybutynin Other (See Comments)    Headache  . Sulfa Antibiotics Rash    Past Surgical History:  Procedure Laterality Date  . arm surgery Left 2018   reduction osteotomy  . BACK SURGERY    . CATARACT EXTRACTION W/PHACO Right 04/16/2019   Procedure: CATARACT EXTRACTION PHACO AND INTRAOCULAR LENS PLACEMENT (IOC) RIGHT SYMFONY LENS 00:31.0  9.5%  3.03;  Surgeon: Eulogio Bear, MD;  Location: [place];  Service: Ophthalmology;  Laterality: Right;  Latex  . CATARACT EXTRACTION W/PHACO Left 05/14/2019   Procedure: CATARACT EXTRACTION PHACO AND INTRAOCULAR LENS PLACEMENT (IOC) LEFT SYMFONY LENS, 0.25, 00:04.9;  Surgeon: Eulogio Bear, MD;  Location: [place];  Service: Ophthalmology;  Laterality: Left;  . COLONOSCOPY WITH PROPOFOL N/A 12/16/2016   Procedure: COLONOSCOPY WITH PROPOFOL;  Surgeon: Lucilla Lame, MD;  Location: [place];  Service: Endoscopy;  Laterality: N/A;  . HAND SURGERY Left 2008   trauma surgery after dog bite  . KNEE ARTHROSCOPY    . LAMINECTOMY  1992  . MENISCUS REPAIR Right 01/2014  . POLYPECTOMY  12/16/2016   Procedure: POLYPECTOMY INTESTINAL;  Surgeon: Lucilla Lame, MD;  Location: [place];  Service: Endoscopy;;  . TOTAL ABDOMINAL HYSTERECTOMY  2011    Social History   Tobacco Use  . Smoking status: Former Smoker    Years: 10.00     Types: Cigarettes    Quit date: 1988    Years since quitting: 33.6  . Smokeless tobacco: Never Used  Vaping Use  . Vaping Use: Never used  Substance Use Topics  . Alcohol use: Yes    Alcohol/week: 0.0 standard drinks    Comment: may have a drink several times per year  . Drug use: No     Medication list has been reviewed and updated.  Current Meds  Medication Sig  . albuterol (VENTOLIN HFA) 108 (90 Base) MCG/ACT inhaler Inhale 2 puffs into the lungs every 6 (six) hours as needed for wheezing or shortness of breath.  . diclofenac Sodium (VOLTAREN) 1 % GEL Apply topically.  . diphenhydrAMINE (SOMINEX) 25 MG tablet Take 25 mg by mouth at bedtime as needed for sleep (benadryl).   . Doxepin HCl 5 % CREA Apply topically.  Marland Kitchen lisinopril-hydrochlorothiazide (ZESTORETIC) 20-12.5 MG tablet Take 1 tablet by mouth daily.  . methocarbamol (ROBAXIN) 500 MG tablet Take 1 tablet by  mouth daily. Half pill  . NONFORMULARY OR COMPOUNDED ITEM Apply 1 gram (1 pump) to the affected area 4 times daily  . omeprazole (PRILOSEC) 20 MG capsule Take 1 capsule (20 mg total) by mouth daily.  Marland Kitchen oxyCODONE-acetaminophen (PERCOCET) 10-325 MG tablet Take 1-2 tablets by mouth every 4 (four) hours.  . Probiotic Product (PROBIOTIC DAILY PO) Take by mouth daily.  . Pseudoephedrine-APAP-DM (DAYQUIL PO) Take by mouth.  . SUMAtriptan (IMITREX) 100 MG tablet TAKE 1 TABLET (100 MG TOTAL) BY MOUTH DAILY AS NEEDED.    PHQ 2/9 Scores 11/20/2019 05/09/2019 11/14/2018 11/10/2017  PHQ - 2 Score 0 1 0 0  PHQ- 9 Score 0 3 - -    GAD 7 : Generalized Anxiety Score 11/20/2019  Nervous, Anxious, on Edge 0  Control/stop worrying 0  Worry too much - different things 0  Trouble relaxing 0  Restless 0  Easily annoyed or irritable 0  Afraid - awful might happen 0  Total GAD 7 Score 0  Anxiety Difficulty Not difficult at all    BP Readings from Last 3 Encounters:  02/04/20 (!) 147/90  01/25/20 131/77  11/20/19 128/78    Physical  Exam Constitutional:      Appearance: Normal appearance.  HENT:     Nose:     Right Sinus: No maxillary sinus tenderness or frontal sinus tenderness.     Left Sinus: No maxillary sinus tenderness or frontal sinus tenderness.  Pulmonary:     Effort: Pulmonary effort is normal.     Comments: No dyspnea noted during the call Occasional dry cough Neurological:     Mental Status: She is alert.  Psychiatric:        Attention and Perception: Attention normal.        Mood and Affect: Mood normal.        Speech: Speech normal.        Cognition and Memory: Cognition normal.     Wt Readings from Last 3 Encounters:  02/04/20 211 lb (95.7 kg)  01/25/20 211 lb (95.7 kg)  11/20/19 210 lb (95.3 kg)    BP (!) 147/90   Ht 5\' 6"  (1.676 m)   Wt 211 lb (95.7 kg)   BMI 34.06 kg/m   Assessment and Plan: 1. Bronchitis If symptoms worsen, recommend follow up and possible covid testing Complete steroid taper started 3 days ago Continue Clartin D once a day - doxycycline (VIBRA-TABS) 100 MG tablet; Take 1 tablet (100 mg total) by mouth 2 (two) times daily for 10 days.  Dispense: 20 tablet; Refill: 0 - promethazine-dextromethorphan (PROMETHAZINE-DM) 6.25-15 MG/5ML syrup; Take 5 mLs by mouth 4 (four) times daily as needed for cough.  Dispense: 180 mL; Refill: 0  2. Mild intermittent asthma without complication - albuterol (VENTOLIN HFA) 108 (90 Base) MCG/ACT inhaler; Inhale 2 puffs into the lungs every 6 (six) hours as needed for wheezing or shortness of breath.  Dispense: 18 g; Refill: 5   Partially dictated using Editor, commissioning. Any errors are unintentional.  Halina Maidens, MD [facility]  02/04/2020

## 2020-02-04 NOTE — Telephone Encounter (Signed)
Patient has been dealing with urine incontinence for years. Wants to know if we can place a referral to see urology. Spoke with Dr Army Melia and she placed the referral for the patient.   CM

## 2020-02-20 ENCOUNTER — Telehealth: Payer: Self-pay | Admitting: Internal Medicine

## 2020-02-20 NOTE — Telephone Encounter (Signed)
Called and left message.

## 2020-02-20 NOTE — Telephone Encounter (Signed)
Copied from [place] [phone]. Topic: General - Other >> Feb 20, 2020 12:24 PM Alicia Nichols E wrote: Reason for CRM: Pt developed a fever blister in her nose and she would like to know if Valtrex can be sent in for her before she goes out of town on [employer] Pt asked for the brand name due to the generic not working as well and to push this with her insurance / please advise

## 2020-03-13 ENCOUNTER — Ambulatory Visit
Admission: EM | Admit: 2020-03-13 | Discharge: 2020-03-13 | Disposition: A | Discharge status: Home / Self Care | Payer: BC Managed Care – PPO | Attending: Internal Medicine | Admitting: Internal Medicine

## 2020-03-13 ENCOUNTER — Other Ambulatory Visit: Payer: Self-pay

## 2020-03-13 DIAGNOSIS — N3001 Acute cystitis with hematuria: Secondary | ICD-10-CM | POA: Diagnosis not present

## 2020-03-13 LAB — URINALYSIS, COMPLETE (UACMP) WITH MICROSCOPIC
Bilirubin Urine: NEGATIVE
Glucose, UA: NEGATIVE mg/dL
Ketones, ur: NEGATIVE mg/dL
Nitrite: NEGATIVE
Protein, ur: 100 mg/dL — AB
Specific Gravity, Urine: 1.02 (ref 1.005–1.030)
pH: 8.5 — ABNORMAL HIGH (ref 5.0–8.0)

## 2020-03-13 MED ORDER — NITROFURANTOIN MONOHYD MACRO 100 MG PO CAPS: 100.0000 mg | ORAL_CAPSULE | Freq: Two times a day (BID) | ORAL | 0 refills | Status: DC

## 2020-03-13 MED ORDER — CEPHALEXIN 500 MG PO CAPS: 500.0000 mg | ORAL_CAPSULE | Freq: Three times a day (TID) | ORAL | 0 refills | Status: DC

## 2020-03-13 MED ORDER — PHENAZOPYRIDINE HCL 200 MG PO TABS: 200.0000 mg | ORAL_TABLET | Freq: Three times a day (TID) | ORAL | 0 refills | Status: DC

## 2020-03-13 NOTE — ED Provider Notes (Signed)
[facility]    CSN: [id number] Arrival date & time: 03/13/20  1712     History   Chief Complaint Chief Complaint  Patient presents with  . Urinary Tract Infection    HPI Alicia Nichols is a 59 y.o. female who presents with onset of dysuria and urgency  And is only voiding a few drops. Has been having suprapubic cramping and chills. Has hx of frequency and has not seen a urologist yet.     Past Medical History:  Diagnosis Date  . Anemia   . Asthma   . Family history of adverse reaction to anesthesia    sister awareness under anesthesia  . Fibromyalgia   . GERD (gastroesophageal reflux disease)   . Headache   . Hypertension   . Motion sickness    cars, roller coasters  . Osteoarthritis     Patient Active Problem List   Diagnosis Date Noted  . Chronic idiopathic constipation 11/20/2019  . Epigastric pain 05/16/2018  . Drug-induced constipation 11/10/2017  . Polyp of sigmoid colon   . Hyperlipidemia, mild 11/10/2016  . ACL (anterior cruciate ligament) tear 10/21/2015  . Rosacea 10/21/2015  . Fibrocystic breast changes 06/06/2015  . Essential (primary) hypertension 12/03/2014  . Urge incontinence 12/03/2014  . H/O peptic ulcer 12/03/2014  . Fibromyalgia 12/03/2014  . Migraine without aura and without status migrainosus, not intractable 12/03/2014  . Clinical depression 12/03/2014  . Mild intermittent asthma without complication 89/[birth year]/1941  . Hot flash, menopausal 12/03/2014  . Idiopathic insomnia 12/03/2014  . Family history of breast cancer 12/03/2014    Past Surgical History:  Procedure Laterality Date  . arm surgery Left 2018   reduction osteotomy  . BACK SURGERY    . CATARACT EXTRACTION W/PHACO Right 04/16/2019   Procedure: CATARACT EXTRACTION PHACO AND INTRAOCULAR LENS PLACEMENT (IOC) RIGHT SYMFONY LENS 00:31.0  9.5%  3.03;  Surgeon: Eulogio Bear, MD;  Location: [place];  Service: Ophthalmology;  Laterality: Right;  Latex  .  CATARACT EXTRACTION W/PHACO Left 05/14/2019   Procedure: CATARACT EXTRACTION PHACO AND INTRAOCULAR LENS PLACEMENT (IOC) LEFT SYMFONY LENS, 0.25, 00:04.9;  Surgeon: Eulogio Bear, MD;  Location: [place];  Service: Ophthalmology;  Laterality: Left;  . COLONOSCOPY WITH PROPOFOL N/A 12/16/2016   Procedure: COLONOSCOPY WITH PROPOFOL;  Surgeon: Lucilla Lame, MD;  Location: [place];  Service: Endoscopy;  Laterality: N/A;  . HAND SURGERY Left 2008   trauma surgery after dog bite  . KNEE ARTHROSCOPY    . LAMINECTOMY  1992  . MENISCUS REPAIR Right 01/2014  . POLYPECTOMY  12/16/2016   Procedure: POLYPECTOMY INTESTINAL;  Surgeon: Lucilla Lame, MD;  Location: [place];  Service: Endoscopy;;  . TOTAL ABDOMINAL HYSTERECTOMY  2011    OB History   No obstetric history on file.      Home Medications    Prior to Admission medications   Medication Sig Start Date End Date Taking? Authorizing Provider  albuterol (VENTOLIN HFA) 108 (90 Base) MCG/ACT inhaler Inhale 2 puffs into the lungs every 6 (six) hours as needed for wheezing or shortness of breath. 02/04/20   Glean Hess, MD  diclofenac Sodium (VOLTAREN) 1 % GEL Apply topically.    [provider]  diphenhydrAMINE (SOMINEX) 25 MG tablet Take 25 mg by mouth at bedtime as needed for sleep (benadryl).     [provider]  Doxepin HCl 5 % CREA Apply topically.    [provider]  lisinopril-hydrochlorothiazide (ZESTORETIC)  20-12.5 MG tablet Take 1 tablet by mouth daily. 11/20/19   Glean Hess, MD  methocarbamol (ROBAXIN) 500 MG tablet Take 1 tablet by mouth daily. Half pill    [provider]  NONFORMULARY OR COMPOUNDED ITEM Apply 1 gram (1 pump) to the affected area 4 times daily 08/14/19   [provider]  omeprazole (PRILOSEC) 20 MG capsule Take 1 capsule (20 mg total) by mouth daily. 09/14/19   Glean Hess, MD  oxyCODONE-acetaminophen (PERCOCET) 10-325 MG tablet  Take 1-2 tablets by mouth every 4 (four) hours. 12/25/19   [provider]  Probiotic Product (PROBIOTIC DAILY PO) Take by mouth daily.    [provider]  promethazine-dextromethorphan (PROMETHAZINE-DM) 6.25-15 MG/5ML syrup Take 5 mLs by mouth 4 (four) times daily as needed for cough. 02/04/20   Glean Hess, MD  Pseudoephedrine-APAP-DM (DAYQUIL PO) Take by mouth.    [provider]  SUMAtriptan (IMITREX) 100 MG tablet TAKE 1 TABLET (100 MG TOTAL) BY MOUTH DAILY AS NEEDED. 08/12/19   Glean Hess, MD    Family History Family History  Problem Relation Age of Onset  . Rheum arthritis Mother   . Breast cancer Maternal Aunt   . Breast cancer Maternal Grandmother     Social History Social History   Tobacco Use  . Smoking status: Former Smoker    Years: 10.00    Types: Cigarettes    Quit date: 1988    Years since quitting: 33.7  . Smokeless tobacco: Never Used  Vaping Use  . Vaping Use: Never used  Substance Use Topics  . Alcohol use: Yes    Alcohol/week: 0.0 standard drinks    Comment: may have a drink several times per year  . Drug use: No     Allergies   Aspirin, Hydrocodone, Ambien [zolpidem], Bupropion hcl, Divalproex sodium er, Duloxetine hcl, Escitalopram, Morphine sulfate, Naproxen, Ampicillin, Codeine, Latex, Oxybutynin, and Sulfa antibiotics   Review of Systems Review of Systems  Constitutional: Positive for chills. Negative for fatigue and fever.  Gastrointestinal: Positive for abdominal pain.  Genitourinary: Positive for decreased urine volume, difficulty urinating, dysuria, frequency and urgency. Negative for hematuria and vaginal discharge.  Skin: Negative for rash.     Physical Exam Triage Vital Signs ED Triage Vitals  Enc Vitals Group     BP 03/13/20 1809 (!) 144/99     Pulse Rate 03/13/20 1809 72     Resp 03/13/20 1809 16     Temp 03/13/20 1809 98.2 F (36.8 C)     Temp Source 03/13/20 1809 Oral     SpO2 03/13/20  1809 100 %     Weight 03/13/20 1810 206 lb (93.4 kg)     Height 03/13/20 1810 5\' 6"  (1.676 m)     Head Circumference --      Peak Flow --      Pain Score 03/13/20 1810 7     Pain Loc --      Pain Edu? --      Excl. in [place]? --    No data found.  Updated Vital Signs BP (!) 144/99   Pulse 72   Temp 98.2 F (36.8 C) (Oral)   Resp 16   Ht 5\' 6"  (1.676 m)   Wt 206 lb (93.4 kg)   SpO2 100%   BMI 33.25 kg/m   Visual Acuity Right Eye Distance:   Left Eye Distance:   Bilateral Distance:    Right Eye Near:   Left  Eye Near:    Bilateral Near:     Physical Exam Physical Exam Vitals and nursing note reviewed.  Constitutional:      General: She is not in acute distress.    Appearance: She is not toxic-appearing.  HENT:     Head: Normocephalic.     Right Ear: External ear normal.     Left Ear: External ear normal.  Eyes:     General: No scleral icterus.    Conjunctiva/sclera: Conjunctivae normal.  Pulmonary:     Effort: Pulmonary effort is normal.  Abdominal:     General: Bowel sounds are normal.     Palpations: Abdomen is soft. There is no mass.     Tenderness: There is no guarding or rebound.     Comments: - CVA tenderness   Musculoskeletal:        General: Normal range of motion.     Cervical back: Neck supple.   Skin:    General: Skin is warm and dry.     Findings: No rash.  Neurological:     Mental Status: She is alert and oriented to person, place, and time.     Gait: Gait normal.  Psychiatric:        Mood and Affect: Mood normal.        Behavior: Behavior normal.        Thought Content: Thought content normal.        Judgment: Judgment normal.     UC Treatments / Results  Labs (all labs ordered are listed, but only abnormal results are displayed) Labs Reviewed  URINALYSIS, COMPLETE (UACMP) WITH MICROSCOPIC    EKG   Radiology No results found.  Procedures Procedures (including critical care time)  Medications Ordered in UC Medications - No  data to display  Initial Impression / Assessment and Plan / UC Course  I have reviewed the triage vital signs and the nursing notes. Has UTI Pertinent labs results that were available during my care of the patient were reviewed by me and considered in my medical decision making (see chart for details). I placed her on Keflex and pyridium as noted. Urine sent out for a culture We will call her if we need to change the medication Needs to see a urologist for her chronic frequency.   Final Clinical Impressions(s) / UC Diagnoses   Final diagnoses:  None   Discharge Instructions   None    ED Prescriptions    None     PDMP not reviewed this encounter.   Shelby Mattocks, [place] 03/13/20 1928

## 2020-03-13 NOTE — ED Triage Notes (Signed)
Pt reports having burning and pain with urination, urgency. Also there are only a few drops when urinating. C/o cramping and chills as well.

## 2020-03-14 ENCOUNTER — Telehealth: Payer: BC Managed Care – PPO | Admitting: Internal Medicine

## 2020-03-16 LAB — URINE CULTURE
Culture: 70000 — AB
Special Requests: NORMAL

## 2020-05-27 ENCOUNTER — Other Ambulatory Visit: Payer: Self-pay | Admitting: Internal Medicine

## 2020-05-27 ENCOUNTER — Encounter: Payer: Self-pay | Admitting: Internal Medicine

## 2020-05-27 ENCOUNTER — Other Ambulatory Visit: Payer: Self-pay

## 2020-05-27 ENCOUNTER — Ambulatory Visit (INDEPENDENT_AMBULATORY_CARE_PROVIDER_SITE_OTHER): Payer: BC Managed Care – PPO | Admitting: Internal Medicine

## 2020-05-27 VITALS — BP 112/78 | HR 70 | Temp 98.1°F | Ht 66.0 in | Wt 210.0 lb

## 2020-05-27 DIAGNOSIS — N390 Urinary tract infection, site not specified: Secondary | ICD-10-CM | POA: Diagnosis not present

## 2020-05-27 DIAGNOSIS — I10 Essential (primary) hypertension: Secondary | ICD-10-CM | POA: Diagnosis not present

## 2020-05-27 DIAGNOSIS — Z8711 Personal history of peptic ulcer disease: Secondary | ICD-10-CM

## 2020-05-27 DIAGNOSIS — G43009 Migraine without aura, not intractable, without status migrainosus: Secondary | ICD-10-CM

## 2020-05-27 LAB — POC URINALYSIS WITH MICROSCOPIC (NON AUTO)MANUAL RESULT
Bilirubin, UA: NEGATIVE
Crystals: 0
Epithelial cells, urine per micros: 0
Glucose, UA: NEGATIVE
Ketones, UA: NEGATIVE
Mucus, UA: 0
Nitrite, UA: NEGATIVE
Protein, UA: POSITIVE — AB
RBC: 3 M/uL — AB (ref 4.04–5.48)
Spec Grav, UA: 1.01 (ref 1.010–1.025)
Urobilinogen, UA: 0.2 E.U./dL
WBC Casts, UA: 30
pH, UA: 6.5 (ref 5.0–8.0)

## 2020-05-27 MED ORDER — LANSOPRAZOLE 30 MG PO CPDR: 30.0000 mg | DELAYED_RELEASE_CAPSULE | Freq: Every day | ORAL | 0 refills | Status: DC

## 2020-05-27 MED ORDER — CEFUROXIME AXETIL 500 MG PO TABS: 500.0000 mg | ORAL_TABLET | Freq: Two times a day (BID) | ORAL | 0 refills | Status: AC

## 2020-05-27 MED ORDER — SUMATRIPTAN SUCCINATE 100 MG PO TABS: 100.0000 mg | ORAL_TABLET | Freq: Every day | ORAL | 5 refills | Status: DC | PRN

## 2020-05-27 NOTE — Progress Notes (Signed)
Date:  05/27/2020   Name:  Alicia Nichols   DOB:  04-07-1961   MRN:  [id number]   Chief Complaint: Hypertension and Urinary Tract Infection (X4 days, cramping in bladder, little to nothing comes out, has been drinking cranberry juice, sensation to go )  Hypertension This is a chronic problem. The problem is controlled. Associated symptoms include headaches. Pertinent negatives include no chest pain, palpitations or shortness of breath. Past treatments include ACE inhibitors and diuretics. The current treatment provides significant improvement.  Urinary Tract Infection  This is a recurrent problem. The current episode started in the past 7 days. The problem occurs every urination. The problem has been gradually worsening. The quality of the pain is described as aching and shooting. The pain is mild. There has been no fever. Associated symptoms include flank pain, frequency and urgency. Pertinent negatives include no chills or nausea. She has tried increased fluids for the symptoms. The treatment provided no relief.  Migraine  This is a recurrent problem. The problem occurs intermittently. The problem has been unchanged. The pain does not radiate. The pain quality is similar to prior headaches. Associated symptoms include abdominal pain and photophobia. Pertinent negatives include no coughing, dizziness, fever, nausea, phonophobia or weight loss. The symptoms are aggravated by weather changes. She has tried triptans for the symptoms. The treatment provided significant relief. Her past medical history is significant for hypertension.  Constipation This is a chronic problem. Her stool frequency is 2 to 3 times per week. The stool is described as firm. The patient is on a high fiber diet. She does not exercise regularly. There has been adequate water intake. Associated symptoms include abdominal pain. Pertinent negatives include no fever, nausea, rectal pain or weight loss. Treatments tried: dulcolax,  stool softeners, Miralax. The treatment provided mild relief.  Gastroesophageal Reflux She complains of abdominal pain. She reports no chest pain, no coughing or no nausea. This is a recurrent problem. Pertinent negatives include no fatigue or weight loss. She has tried a PPI (omeprazole controls the gerd but not the other abdominal symptoms) for the symptoms.    Lab Results  Component Value Date   CREATININE 0.79 11/20/2019   BUN 13 11/20/2019   NA 137 11/20/2019   K 4.3 11/20/2019   CL 98 11/20/2019   CO2 25 11/20/2019   Lab Results  Component Value Date   CHOL 248 (H) 11/20/2019   HDL 59 11/20/2019   LDLCALC 161 (H) 11/20/2019   TRIG 155 (H) 11/20/2019   CHOLHDL 4.2 11/20/2019   Lab Results  Component Value Date   TSH 1.730 11/20/2019   No results found for: HGBA1C Lab Results  Component Value Date   WBC 3.7 11/20/2019   HGB 12.3 11/20/2019   HCT 37.1 11/20/2019   MCV 87 11/20/2019   PLT 324 11/20/2019   Lab Results  Component Value Date   ALT 10 11/20/2019   AST 15 11/20/2019   ALKPHOS 93 11/20/2019   BILITOT 0.3 11/20/2019     Review of Systems  Constitutional: Negative for chills, fatigue, fever and weight loss.  Eyes: Positive for photophobia.  Respiratory: Negative for cough, chest tightness and shortness of breath.   Cardiovascular: Negative for chest pain and palpitations.  Gastrointestinal: Positive for abdominal distention, abdominal pain and constipation. Negative for nausea and rectal pain.  Genitourinary: Positive for dysuria, flank pain, frequency and urgency.  Neurological: Positive for headaches. Negative for dizziness.  Psychiatric/Behavioral: Negative for dysphoric mood and sleep  disturbance. The patient is not nervous/anxious.     Patient Active Problem List   Diagnosis Date Noted   Chronic idiopathic constipation 11/20/2019   Epigastric pain 05/16/2018   Drug-induced constipation 11/10/2017   Polyp of sigmoid colon     Hyperlipidemia, mild 11/10/2016   ACL (anterior cruciate ligament) tear 10/21/2015   Rosacea 10/21/2015   Fibrocystic breast changes 06/06/2015   Essential (primary) hypertension 12/03/2014   Urge incontinence 12/03/2014   H/O peptic ulcer 12/03/2014   Fibromyalgia 12/03/2014   Migraine without aura and without status migrainosus, not intractable 12/03/2014   Clinical depression 12/03/2014   Mild intermittent asthma without complication 03/54/6568   Hot flash, menopausal 12/03/2014   Idiopathic insomnia 12/03/2014   Family history of breast cancer 12/03/2014    Allergies  Allergen Reactions   Aspirin Shortness Of Breath   Hydrocodone Shortness Of Breath   Ambien [Zolpidem]     Suicidal, also caused vision issues   Bupropion Hcl     intolerance   Divalproex Sodium Er Nausea And Vomiting   Duloxetine Hcl     intolerance   Escitalopram     intolerance   Macrobid [Nitrofurantoin]     Severe abdominal pain   Morphine Sulfate Itching   Naproxen     delusions   Ampicillin Rash   Codeine Itching and Rash        Latex Rash    Gloves - when worn for extended period   Oxybutynin Other (See Comments)    Headache   Sulfa Antibiotics Rash    Past Surgical History:  Procedure Laterality Date   arm surgery Left 2018   reduction osteotomy   BACK SURGERY     CATARACT EXTRACTION W/PHACO Right 04/16/2019   Procedure: CATARACT EXTRACTION PHACO AND INTRAOCULAR LENS PLACEMENT ([facility]) RIGHT SYMFONY LENS 00:31.0  9.5%  3.03;  Surgeon: Eulogio Bear, MD;  Location: [place];  Service: Ophthalmology;  Laterality: Right;  Latex   CATARACT EXTRACTION W/PHACO Left 05/14/2019   Procedure: CATARACT EXTRACTION PHACO AND INTRAOCULAR LENS PLACEMENT ([facility]) LEFT SYMFONY LENS, 0.25, 00:04.9;  Surgeon: Eulogio Bear, MD;  Location: [place];  Service: Ophthalmology;  Laterality: Left;   COLONOSCOPY WITH PROPOFOL N/A 12/16/2016   Procedure:  COLONOSCOPY WITH PROPOFOL;  Surgeon: Lucilla Lame, MD;  Location: [place];  Service: Endoscopy;  Laterality: N/A;   HAND SURGERY Left 2008   trauma surgery after dog bite   KNEE ARTHROSCOPY     LAMINECTOMY  1992   MENISCUS REPAIR Right 01/2014   POLYPECTOMY  12/16/2016   Procedure: POLYPECTOMY INTESTINAL;  Surgeon: Lucilla Lame, MD;  Location: [place];  Service: Endoscopy;;   TOTAL ABDOMINAL HYSTERECTOMY  2011    Social History   Tobacco Use   Smoking status: Former Smoker    Years: 10.00    Types: Cigarettes    Quit date: 1988    Years since quitting: 33.9   Smokeless tobacco: Never Used  Scientific laboratory technician Use: Never used  Substance Use Topics   Alcohol use: Yes    Alcohol/week: 0.0 standard drinks    Comment: may have a drink several times per year   Drug use: No     Medication list has been reviewed and updated.  Current Meds  Medication Sig   albuterol (VENTOLIN HFA) 108 (90 Base) MCG/ACT inhaler Inhale 2 puffs into the lungs every 6 (six) hours as needed for wheezing or shortness of breath.   diclofenac  Sodium (VOLTAREN) 1 % GEL Apply topically as needed.   diphenhydrAMINE (SOMINEX) 25 MG tablet Take 25 mg by mouth at bedtime as needed for sleep (benadryl).    Doxepin HCl 5 % CREA Apply topically.   lisinopril-hydrochlorothiazide (ZESTORETIC) 20-12.5 MG tablet Take 1 tablet by mouth daily.   methocarbamol (ROBAXIN) 500 MG tablet Take 1 tablet by mouth daily. Half pill   omeprazole (PRILOSEC) 20 MG capsule Take 1 capsule (20 mg total) by mouth daily.   oxyCODONE-acetaminophen (PERCOCET) 10-325 MG tablet Take 1-2 tablets by mouth every 4 (four) hours.   Probiotic Product (PROBIOTIC DAILY PO) Take by mouth daily.   Pseudoephedrine-APAP-DM (DAYQUIL PO) Take by mouth.   SUMAtriptan (IMITREX) 100 MG tablet TAKE 1 TABLET (100 MG TOTAL) BY MOUTH DAILY AS NEEDED.    PHQ 2/9 Scores 05/27/2020 11/20/2019 05/09/2019 11/14/2018  PHQ -  2 Score 0 0 1 0  PHQ- 9 Score 3 0 3 -    GAD 7 : Generalized Anxiety Score 05/27/2020 11/20/2019  Nervous, Anxious, on Edge 1 0  Control/stop worrying 1 0  Worry too much - different things 1 0  Trouble relaxing 1 0  Restless 0 0  Easily annoyed or irritable 0 0  Afraid - awful might happen 0 0  Total GAD 7 Score 4 0  Anxiety Difficulty - Not difficult at all    BP Readings from Last 3 Encounters:  05/27/20 112/78  03/13/20 (!) 144/99  02/04/20 (!) 147/90    Physical Exam Vitals and nursing note reviewed.  Constitutional:      Appearance: Normal appearance. She is well-developed and well-nourished.  Cardiovascular:     Rate and Rhythm: Normal rate and regular rhythm.     Pulses: Normal pulses.     Heart sounds: Normal heart sounds.  Pulmonary:     Effort: Pulmonary effort is normal. No respiratory distress.     Breath sounds: Normal breath sounds.  Abdominal:     General: Bowel sounds are normal.     Palpations: Abdomen is soft.     Tenderness: There is abdominal tenderness in the epigastric area and suprapubic area. There is no CVA tenderness, right CVA tenderness, left CVA tenderness, guarding or rebound.  Musculoskeletal:     Cervical back: Normal range of motion.  Psychiatric:        Attention and Perception: Attention normal.        Mood and Affect: Mood and affect and mood normal.     Wt Readings from Last 3 Encounters:  05/27/20 210 lb (95.3 kg)  03/13/20 206 lb (93.4 kg)  02/04/20 211 lb (95.7 kg)    BP 112/78    Pulse 70    Temp 98.1 F (36.7 C) (Oral)    Ht 5\' 6"  (1.676 m)    Wt 210 lb (95.3 kg)    SpO2 97%    BMI 33.89 kg/m   Assessment and Plan: 1. Recurrent UTI (urinary tract infection) Will treat presumptively with Ceftin due to multiple allergies Obtain urine culture Refer to Urology - 3 documented UTIs since August - POC urinalysis w microscopic (non auto) - cefUROXime (CEFTIN) 500 MG tablet; Take 1 tablet (500 mg total) by mouth 2 (two)  times daily with a meal for 10 days.  Dispense: 20 tablet; Refill: 0 - Urine Culture - Ambulatory referral to Urology  2. Migraine without aura and without status migrainosus, not intractable Migraine headaches remain unchanged - frequency slightly less since she is able to avoid  triggers HA still responds well to PRN imitrex - SUMAtriptan (IMITREX) 100 MG tablet; Take 1 tablet (100 mg total) by mouth daily as needed.  Dispense: 9 tablet; Refill: 5  3. Essential (primary) hypertension Clinically stable exam with well controlled BP. Tolerating medications without side effects at this time. Pt to continue current regimen and low sodium diet; benefits of regular exercise as able discussed.  4. H/O peptic ulcer Abdominal complaints may be a side effect of omeprazole Will stop omeprazole and begin Prevacid in its place If no improvement, consider GI follow up - lansoprazole (PREVACID) 30 MG capsule; Take 1 capsule (30 mg total) by mouth daily at 12 noon.  Dispense: 90 capsule; Refill: 0   Partially dictated using Editor, commissioning. Any errors are unintentional.  Halina Maidens, MD [facility]  05/27/2020

## 2020-06-02 LAB — URINE CULTURE

## 2020-06-04 ENCOUNTER — Other Ambulatory Visit: Payer: Self-pay | Admitting: Internal Medicine

## 2020-06-05 ENCOUNTER — Other Ambulatory Visit: Payer: Self-pay | Admitting: Internal Medicine

## 2020-06-05 ENCOUNTER — Telehealth: Payer: Self-pay | Admitting: Internal Medicine

## 2020-06-05 ENCOUNTER — Other Ambulatory Visit: Payer: Self-pay

## 2020-06-05 DIAGNOSIS — N390 Urinary tract infection, site not specified: Secondary | ICD-10-CM

## 2020-06-05 MED ORDER — ONDANSETRON HCL 4 MG PO TABS: 4.0000 mg | ORAL_TABLET | Freq: Three times a day (TID) | ORAL | 0 refills | Status: DC | PRN

## 2020-06-05 MED ORDER — CIPROFLOXACIN HCL 250 MG PO TABS: 250.0000 mg | ORAL_TABLET | Freq: Two times a day (BID) | ORAL | 0 refills | Status: AC

## 2020-06-05 NOTE — Telephone Encounter (Signed)
Because of all her medication allergies, we are now down to Cipro.  She needs to watch for any tendon pain which would necessitate stopping the Cipro immediately.  I am only giving 5 days so this should not be a huge concern.

## 2020-06-05 NOTE — Telephone Encounter (Signed)
Called and left patient a detailed message informing her of this information.

## 2020-06-05 NOTE — Telephone Encounter (Signed)
Pt states she had to stop taking the Ceftin Because it made her so sick.  Pt got the zofran, but requesting a different abx to take for UTI. She thought Dr Asencion Partridge was going to call in along with the zofran.  She states she is not having symptoms, but she knows the infection is still there. Please call back.

## 2020-08-18 ENCOUNTER — Other Ambulatory Visit: Payer: Self-pay | Admitting: Internal Medicine

## 2020-08-18 DIAGNOSIS — Z8711 Personal history of peptic ulcer disease: Secondary | ICD-10-CM

## 2020-08-27 ENCOUNTER — Telehealth: Payer: Self-pay | Admitting: Internal Medicine

## 2020-08-27 NOTE — Telephone Encounter (Signed)
Pt called to ask if Dr. Army Melia can write her a extra 90 day supply script for lisinopril-hydrochlorothiazide (ZESTORETIC) 20-12.5 MG tablet / Pt stated she is packing a bag in case of emergency or bad weather or natural disaster / pt stated she would pay out of pocket for it but wants to have this in case/ please advise

## 2020-08-28 NOTE — Telephone Encounter (Signed)
Please let her know we do not prescribe extra prescriptions. She will need to use what she currently has before she can get more.

## 2020-08-28 NOTE — Telephone Encounter (Signed)
Left a message

## 2020-09-26 ENCOUNTER — Telehealth: Payer: Self-pay

## 2020-09-26 ENCOUNTER — Other Ambulatory Visit: Payer: Self-pay | Admitting: Internal Medicine

## 2020-09-26 DIAGNOSIS — Z8711 Personal history of peptic ulcer disease: Secondary | ICD-10-CM

## 2020-09-26 NOTE — Telephone Encounter (Signed)
Pt needs refill called [facility]   albuterol (VENTOLIN HFA) 108 (90 Base) MCG/ACT inhaler [[id number]

## 2020-09-29 ENCOUNTER — Other Ambulatory Visit: Payer: Self-pay

## 2020-09-29 DIAGNOSIS — J4520 Mild intermittent asthma, uncomplicated: Secondary | ICD-10-CM

## 2020-09-29 MED ORDER — ALBUTEROL SULFATE HFA 108 (90 BASE) MCG/ACT IN AERS: 2.0000 | INHALATION_SPRAY | Freq: Four times a day (QID) | RESPIRATORY_TRACT | 5 refills | Status: DC | PRN

## 2020-09-29 NOTE — Telephone Encounter (Signed)
Sent RF to [facility].

## 2020-10-27 ENCOUNTER — Encounter: Payer: Self-pay | Admitting: Internal Medicine

## 2020-10-27 ENCOUNTER — Ambulatory Visit (INDEPENDENT_AMBULATORY_CARE_PROVIDER_SITE_OTHER): Payer: BC Managed Care – PPO | Admitting: Internal Medicine

## 2020-10-27 ENCOUNTER — Other Ambulatory Visit: Payer: Self-pay

## 2020-10-27 VITALS — BP 132/82 | HR 83 | Temp 98.1°F | Ht 66.0 in | Wt 206.0 lb

## 2020-10-27 DIAGNOSIS — M797 Fibromyalgia: Secondary | ICD-10-CM

## 2020-10-27 DIAGNOSIS — J0100 Acute maxillary sinusitis, unspecified: Secondary | ICD-10-CM | POA: Diagnosis not present

## 2020-10-27 MED ORDER — METHOCARBAMOL 500 MG PO TABS: 1.0000 | ORAL_TABLET | Freq: Every day | ORAL | 1 refills | Status: DC

## 2020-10-27 MED ORDER — AZITHROMYCIN 250 MG PO TABS: ORAL_TABLET | ORAL | 0 refills | Status: AC

## 2020-10-27 NOTE — Progress Notes (Signed)
Date:  10/27/2020   Name:  Alicia Nichols   DOB:  09-16-1960   MRN:  [id number]   Chief Complaint: Sinusitis (X2 days, Raw throat, sinus drainage, teeth and cheeks hurt )  Sinusitis This is a new problem. The current episode started in the past 7 days. The problem has been gradually worsening since onset. There has been no fever. The pain is moderate. Associated symptoms include congestion, headaches, a hoarse voice, sinus pressure and a sore throat. Pertinent negatives include no chills, ear pain or shortness of breath. Treatments tried: claritin.    Lab Results  Component Value Date   CREATININE 0.79 11/20/2019   BUN 13 11/20/2019   NA 137 11/20/2019   K 4.3 11/20/2019   CL 98 11/20/2019   CO2 25 11/20/2019   Lab Results  Component Value Date   CHOL 248 (H) 11/20/2019   HDL 59 11/20/2019   LDLCALC 161 (H) 11/20/2019   TRIG 155 (H) 11/20/2019   CHOLHDL 4.2 11/20/2019   Lab Results  Component Value Date   TSH 1.730 11/20/2019   No results found for: HGBA1C Lab Results  Component Value Date   WBC 3.7 11/20/2019   HGB 12.3 11/20/2019   HCT 37.1 11/20/2019   MCV 87 11/20/2019   PLT 324 11/20/2019   Lab Results  Component Value Date   ALT 10 11/20/2019   AST 15 11/20/2019   ALKPHOS 93 11/20/2019   BILITOT 0.3 11/20/2019     Review of Systems  Constitutional: Negative for chills, fatigue and fever.  HENT: Positive for congestion, hoarse voice, postnasal drip, sinus pressure, sinus pain, sore throat and voice change. Negative for ear pain and trouble swallowing.   Respiratory: Negative for chest tightness, shortness of breath and wheezing.   Cardiovascular: Negative for chest pain.  Neurological: Positive for headaches. Negative for dizziness.    Patient Active Problem List   Diagnosis Date Noted  . Chronic idiopathic constipation 11/20/2019  . Epigastric pain 05/16/2018  . Drug-induced constipation 11/10/2017  . Polyp of sigmoid colon   . Hyperlipidemia,  mild 11/10/2016  . ACL (anterior cruciate ligament) tear 10/21/2015  . Rosacea 10/21/2015  . Fibrocystic breast changes 06/06/2015  . Essential (primary) hypertension 12/03/2014  . Urge incontinence 12/03/2014  . H/O peptic ulcer 12/03/2014  . Fibromyalgia 12/03/2014  . Migraine without aura and without status migrainosus, not intractable 12/03/2014  . Clinical depression 12/03/2014  . Mild intermittent asthma without complication 48/06/6551  . Hot flash, menopausal 12/03/2014  . Idiopathic insomnia 12/03/2014  . Family history of breast cancer 12/03/2014    Allergies  Allergen Reactions  . Aspirin Shortness Of Breath  . Hydrocodone Shortness Of Breath  . Ambien [Zolpidem]     Suicidal, also caused vision issues  . Bupropion Hcl     intolerance  . Divalproex Sodium Er Nausea And Vomiting  . Duloxetine Hcl     intolerance  . Escitalopram     intolerance  . Macrobid [Nitrofurantoin]     Severe abdominal pain  . Morphine Sulfate Itching  . Naproxen     delusions  . Ampicillin Rash  . Codeine Itching and Rash       . Latex Rash    Gloves - when worn for extended period  . Oxybutynin Other (See Comments)    Headache  . Sulfa Antibiotics Rash    Past Surgical History:  Procedure Laterality Date  . arm surgery Left 2018   reduction osteotomy  .  BACK SURGERY    . CATARACT EXTRACTION W/PHACO Right 04/16/2019   Procedure: CATARACT EXTRACTION PHACO AND INTRAOCULAR LENS PLACEMENT ([facility]) RIGHT SYMFONY LENS 00:31.0  9.5%  3.03;  Surgeon: Eulogio Bear, MD;  Location: [place];  Service: Ophthalmology;  Laterality: Right;  Latex  . CATARACT EXTRACTION W/PHACO Left 05/14/2019   Procedure: CATARACT EXTRACTION PHACO AND INTRAOCULAR LENS PLACEMENT ([facility]) LEFT SYMFONY LENS, 0.25, 00:04.9;  Surgeon: Eulogio Bear, MD;  Location: [place];  Service: Ophthalmology;  Laterality: Left;  . COLONOSCOPY WITH PROPOFOL N/A 12/16/2016   Procedure: COLONOSCOPY WITH  PROPOFOL;  Surgeon: Lucilla Lame, MD;  Location: [place];  Service: Endoscopy;  Laterality: N/A;  . HAND SURGERY Left 2008   trauma surgery after dog bite  . KNEE ARTHROSCOPY    . LAMINECTOMY  1992  . MENISCUS REPAIR Right 01/2014  . POLYPECTOMY  12/16/2016   Procedure: POLYPECTOMY INTESTINAL;  Surgeon: Lucilla Lame, MD;  Location: [place];  Service: Endoscopy;;  . TOTAL ABDOMINAL HYSTERECTOMY  2011    Social History   Tobacco Use  . Smoking status: Former Smoker    Years: 10.00    Types: Cigarettes    Quit date: 1988    Years since quitting: 34.4  . Smokeless tobacco: Never Used  Vaping Use  . Vaping Use: Never used  Substance Use Topics  . Alcohol use: Yes    Alcohol/week: 0.0 standard drinks    Comment: may have a drink several times per year  . Drug use: No     Medication list has been reviewed and updated.  Current Meds  Medication Sig  . albuterol (VENTOLIN HFA) 108 (90 Base) MCG/ACT inhaler Inhale 2 puffs into the lungs every 6 (six) hours as needed for wheezing or shortness of breath.  . diclofenac (VOLTAREN) 75 MG EC tablet Take 75 mg by mouth 2 (two) times daily as needed.  . diclofenac Sodium (VOLTAREN) 1 % GEL Apply topically as needed.  . diphenhydrAMINE (SOMINEX) 25 MG tablet Take 25 mg by mouth at bedtime as needed for sleep (benadryl).   . Doxepin HCl 5 % CREA Apply topically.  . lansoprazole (PREVACID) 30 MG capsule TAKE 1 CAPSULE (30 MG TOTAL) BY MOUTH DAILY AT 12 NOON.  Marland Kitchen lisinopril-hydrochlorothiazide (ZESTORETIC) 20-12.5 MG tablet Take 1 tablet by mouth daily.  . methocarbamol (ROBAXIN) 500 MG tablet Take 1 tablet by mouth daily. Half pill  . NONFORMULARY OR COMPOUNDED ITEM   . oxyCODONE-acetaminophen (PERCOCET) 10-325 MG tablet Take 1-2 tablets by mouth every 4 (four) hours.  . Probiotic Product (PROBIOTIC DAILY PO) Take by mouth daily.  . Pseudoephedrine-APAP-DM (DAYQUIL PO) Take by mouth as needed.  . SUMAtriptan (IMITREX)  100 MG tablet Take 1 tablet (100 mg total) by mouth daily as needed.  . [DISCONTINUED] ondansetron (ZOFRAN) 4 MG tablet Take 1 tablet (4 mg total) by mouth every 8 (eight) hours as needed for nausea or vomiting.    PHQ 2/9 Scores 10/27/2020 05/27/2020 11/20/2019 05/09/2019  PHQ - 2 Score 1 0 0 1  PHQ- 9 Score 5 3 0 3    GAD 7 : Generalized Anxiety Score 10/27/2020 05/27/2020 11/20/2019  Nervous, Anxious, on Edge 0 1 0  Control/stop worrying 0 1 0  Worry too much - different things 0 1 0  Trouble relaxing 2 1 0  Restless 0 0 0  Easily annoyed or irritable 1 0 0  Afraid - awful might happen 0 0 0  Total GAD  7 Score 3 4 0  Anxiety Difficulty - - Not difficult at all    BP Readings from Last 3 Encounters:  10/27/20 132/82  05/27/20 112/78  03/13/20 (!) 144/99    Physical Exam Constitutional:      Appearance: She is well-developed.  HENT:     Right Ear: Ear canal and external ear normal. Tympanic membrane is not erythematous or retracted.     Left Ear: Ear canal and external ear normal. Tympanic membrane is not erythematous or retracted.     Nose:     Right Sinus: Maxillary sinus tenderness and frontal sinus tenderness present.     Left Sinus: Maxillary sinus tenderness and frontal sinus tenderness present.     Mouth/Throat:     Mouth: No oral lesions.     Pharynx: Uvula midline. Posterior oropharyngeal erythema present. No oropharyngeal exudate.  Cardiovascular:     Rate and Rhythm: Normal rate and regular rhythm.     Pulses: Normal pulses.     Heart sounds: Normal heart sounds.  Pulmonary:     Breath sounds: Normal breath sounds. No wheezing or rales.  Musculoskeletal:     Cervical back: Normal range of motion and neck supple.  Lymphadenopathy:     Cervical: No cervical adenopathy.  Neurological:     Mental Status: She is alert and oriented to person, place, and time.     Wt Readings from Last 3 Encounters:  10/27/20 206 lb (93.4 kg)  05/27/20 210 lb (95.3 kg)   03/13/20 206 lb (93.4 kg)    BP 132/82   Pulse 83   Temp 98.1 F (36.7 C) (Oral)   Ht 5\' 6"  (1.676 m)   Wt 206 lb (93.4 kg)   SpO2 99%   BMI 33.25 kg/m   Assessment and Plan: 1. Acute non-recurrent maxillary sinusitis Continue claritin, saline spray, can try [place] sinus rinses - azithromycin (ZITHROMAX Z-PAK) 250 MG tablet; UAD  Dispense: 6 each; Refill: 0  2. Fibromyalgia - methocarbamol (ROBAXIN) 500 MG tablet; Take 1 tablet (500 mg total) by mouth daily.  Dispense: 30 tablet; Refill: 1   Partially dictated using Editor, commissioning. Any errors are unintentional.  Halina Maidens, MD [facility]  10/27/2020

## 2020-10-30 ENCOUNTER — Telehealth: Payer: Self-pay

## 2020-10-30 NOTE — Telephone Encounter (Unsigned)
Copied from [place] [phone] [id number]. Topic: General - Other >> Oct 30, 2020  3:21 PM Pawlus, Brayton Layman A wrote: Reason for CRM: Pt stated she was just seen for a sinus infection, pt stated the medications sent in are not helping and she still has drainage, pt wanted to know if there was anything else that could be sent in.

## 2020-10-30 NOTE — Telephone Encounter (Signed)
Told pt that she needed to complete the course of her antibiotics. Pt still insisted on getting something else after I explained to her that she would need to complete her antibiotics. Pt wants prednisone as well.  KP

## 2020-10-31 NOTE — Telephone Encounter (Signed)
Called pt told her she could get coricidin HBP for a drying agent. Drainage is good the antibiotics is to treat the infection. Drainage should become clear and thin but could have drainage for a while. Pt verbalized understanding.  KP

## 2020-10-31 NOTE — Telephone Encounter (Signed)
Drainage is good - you want it to come out.  The antibiotics are to treat the infection, you may still have drainage for a while but it should become clear and thin.  You can take coricidin HBP is you want a drying agent.

## 2020-11-25 ENCOUNTER — Encounter: Payer: Self-pay | Admitting: Internal Medicine

## 2020-11-25 ENCOUNTER — Other Ambulatory Visit: Payer: Self-pay

## 2020-11-25 ENCOUNTER — Ambulatory Visit (INDEPENDENT_AMBULATORY_CARE_PROVIDER_SITE_OTHER): Payer: BC Managed Care – PPO | Admitting: Internal Medicine

## 2020-11-25 VITALS — BP 140/84 | HR 64 | Temp 98.0°F | Ht 66.0 in | Wt 204.0 lb

## 2020-11-25 DIAGNOSIS — K5904 Chronic idiopathic constipation: Secondary | ICD-10-CM | POA: Diagnosis not present

## 2020-11-25 DIAGNOSIS — Z1231 Encounter for screening mammogram for malignant neoplasm of breast: Secondary | ICD-10-CM | POA: Diagnosis not present

## 2020-11-25 DIAGNOSIS — Z0000 Encounter for general adult medical examination without abnormal findings: Secondary | ICD-10-CM

## 2020-11-25 DIAGNOSIS — I10 Essential (primary) hypertension: Secondary | ICD-10-CM | POA: Diagnosis not present

## 2020-11-25 DIAGNOSIS — Z8711 Personal history of peptic ulcer disease: Secondary | ICD-10-CM

## 2020-11-25 DIAGNOSIS — G43009 Migraine without aura, not intractable, without status migrainosus: Secondary | ICD-10-CM | POA: Diagnosis not present

## 2020-11-25 DIAGNOSIS — K1379 Other lesions of oral mucosa: Secondary | ICD-10-CM

## 2020-11-25 LAB — POCT URINALYSIS DIPSTICK
Bilirubin, UA: NEGATIVE
Blood, UA: NEGATIVE
Glucose, UA: NEGATIVE
Ketones, UA: NEGATIVE
Leukocytes, UA: NEGATIVE
Nitrite, UA: NEGATIVE
Protein, UA: NEGATIVE
Spec Grav, UA: 1.01 (ref 1.010–1.025)
Urobilinogen, UA: 0.2 E.U./dL
pH, UA: 7 (ref 5.0–8.0)

## 2020-11-25 MED ORDER — LISINOPRIL-HYDROCHLOROTHIAZIDE 20-12.5 MG PO TABS: 1.0000 | ORAL_TABLET | Freq: Every day | ORAL | 3 refills | Status: DC

## 2020-11-25 MED ORDER — SUMATRIPTAN SUCCINATE 100 MG PO TABS: 100.0000 mg | ORAL_TABLET | Freq: Every day | ORAL | 5 refills | Status: DC | PRN

## 2020-11-25 MED ORDER — LANSOPRAZOLE 30 MG PO CPDR: 30.0000 mg | DELAYED_RELEASE_CAPSULE | Freq: Every day | ORAL | 1 refills | Status: DC

## 2020-11-25 NOTE — Patient Instructions (Signed)
Try Nurtec or Ubrelvy as needed for migraine HA but only one dose in a 24 hour period.

## 2020-11-25 NOTE — Progress Notes (Signed)
Date:  11/25/2020   Name:  Alicia Nichols   DOB:  09-20-1960   MRN:  [id number]   Chief Complaint: Annual Exam (Breast exam no pap) [name] is a 60 y.o. female who presents today for her Complete Annual Exam. She feels fairly well. She reports exercising none. She reports she is sleeping fairly well. Breast complaints none.  Still having allergy sx with itching, ear itching, heated skin.  Mammogram: 10/2019 scheduled in July DEXA: none Pap smear: discontinued Colonoscopy: 12/2016  Immunization History  Administered Date(s) Administered   Influenza,inj,Quad PF,6+ Mos 05/09/2019   PFIZER(Purple Top)SARS-COV-2 Vaccination 11/13/2019, 12/06/2019   Tdap 04/24/2013    Hypertension This is a chronic problem. Associated symptoms include palpitations. Pertinent negatives include no chest pain, headaches or shortness of breath. Past treatments include ACE inhibitors and diuretics. The current treatment provides significant improvement.  Migraine  This is a recurrent problem. The problem has been unchanged. The pain quality is similar to prior headaches. Pertinent negatives include no abdominal pain, coughing, dizziness, fever, hearing loss, tinnitus or vomiting. The symptoms are aggravated by weather changes (scents). Her past medical history is significant for hypertension.  Gastroesophageal Reflux She reports no abdominal pain, no chest pain, no coughing or no wheezing. Pertinent negatives include no fatigue.  Constipation This is a chronic problem. The problem is unchanged. Her stool frequency is 2 to 3 times per week. The patient is on a high fiber diet. She Does not exercise regularly. There has Been adequate water intake. Pertinent negatives include no abdominal pain, diarrhea, fever or vomiting. Treatments tried: Movantik, Linzess with only temporary relief.  Oral ulcers - having recurrent oral ulcers and most recently swelling on the left side of her face with fullness inside the left  cheek.  It is improving but still slightly swollen and tender. Lab Results  Component Value Date   CREATININE 0.79 11/20/2019   BUN 13 11/20/2019   NA 137 11/20/2019   K 4.3 11/20/2019   CL 98 11/20/2019   CO2 25 11/20/2019   Lab Results  Component Value Date   CHOL 248 (H) 11/20/2019   HDL 59 11/20/2019   LDLCALC 161 (H) 11/20/2019   TRIG 155 (H) 11/20/2019   CHOLHDL 4.2 11/20/2019   Lab Results  Component Value Date   TSH 1.730 11/20/2019   No results found for: HGBA1C Lab Results  Component Value Date   WBC 3.7 11/20/2019   HGB 12.3 11/20/2019   HCT 37.1 11/20/2019   MCV 87 11/20/2019   PLT 324 11/20/2019   Lab Results  Component Value Date   ALT 10 11/20/2019   AST 15 11/20/2019   ALKPHOS 93 11/20/2019   BILITOT 0.3 11/20/2019     Review of Systems  Constitutional:  Negative for chills, fatigue and fever.  HENT:  Negative for congestion, hearing loss, tinnitus, trouble swallowing and voice change.   Eyes:  Negative for visual disturbance.  Respiratory:  Negative for cough, chest tightness, shortness of breath and wheezing.   Cardiovascular:  Positive for palpitations. Negative for chest pain and leg swelling.  Gastrointestinal:  Positive for constipation. Negative for abdominal pain, diarrhea and vomiting.  Endocrine: Negative for polydipsia and polyuria.  Genitourinary:  Negative for dysuria, frequency, genital sores, vaginal bleeding and vaginal discharge.  Musculoskeletal:  Negative for arthralgias, gait problem and joint swelling.  Skin:  Negative for color change and rash.       Itching and burning skin -  Neurological:  Negative for dizziness, tremors, light-headedness and headaches.  Hematological:  Negative for adenopathy. Does not bruise/bleed easily.  Psychiatric/Behavioral:  Negative for dysphoric mood and sleep disturbance. The patient is not nervous/anxious.    Patient Active Problem List   Diagnosis Date Noted   Chronic idiopathic  constipation 11/20/2019   Epigastric pain 05/16/2018   Drug-induced constipation 11/10/2017   Polyp of sigmoid colon    Hyperlipidemia, mild 11/10/2016   ACL (anterior cruciate ligament) tear 10/21/2015   Rosacea 10/21/2015   Fibrocystic breast changes 06/06/2015   Essential (primary) hypertension 12/03/2014   Urge incontinence 12/03/2014   H/O peptic ulcer 12/03/2014   Fibromyalgia 12/03/2014   Migraine without aura and without status migrainosus, not intractable 12/03/2014   Clinical depression 12/03/2014   Mild intermittent asthma without complication 18/29/9371   Hot flash, menopausal 12/03/2014   Idiopathic insomnia 12/03/2014   Family history of breast cancer 12/03/2014    Allergies  Allergen Reactions   Aspirin Shortness Of Breath   Hydrocodone Shortness Of Breath   Ambien [Zolpidem]     Suicidal, also caused vision issues   Bupropion Hcl     intolerance   Divalproex Sodium Er Nausea And Vomiting   Duloxetine Hcl     intolerance   Escitalopram     intolerance   Macrobid [Nitrofurantoin]     Severe abdominal pain   Morphine Sulfate Itching   Naproxen     delusions   Ampicillin Rash   Codeine Itching and Rash        Latex Rash    Gloves - when worn for extended period   Oxybutynin Other (See Comments)    Headache   Sulfa Antibiotics Rash    Past Surgical History:  Procedure Laterality Date   arm surgery Left 2018   reduction osteotomy   BACK SURGERY     CATARACT EXTRACTION W/PHACO Right 04/16/2019   Procedure: CATARACT EXTRACTION PHACO AND INTRAOCULAR LENS PLACEMENT ([place]) RIGHT SYMFONY LENS 00:31.0  9.5%  3.03;  Surgeon: Eulogio Bear, MD;  Location: [place];  Service: Ophthalmology;  Laterality: Right;  Latex   CATARACT EXTRACTION W/PHACO Left 05/14/2019   Procedure: CATARACT EXTRACTION PHACO AND INTRAOCULAR LENS PLACEMENT ([facility]) LEFT SYMFONY LENS, 0.25, 00:04.9;  Surgeon: Eulogio Bear, MD;  Location: [place];  Service:  Ophthalmology;  Laterality: Left;   COLONOSCOPY WITH PROPOFOL N/A 12/16/2016   Procedure: COLONOSCOPY WITH PROPOFOL;  Surgeon: Lucilla Lame, MD;  Location: [facility];  Service: Endoscopy;  Laterality: N/A;   HAND SURGERY Left 2008   trauma surgery after dog bite   KNEE ARTHROSCOPY     LAMINECTOMY  1992   MENISCUS REPAIR Right 01/2014   POLYPECTOMY  12/16/2016   Procedure: POLYPECTOMY INTESTINAL;  Surgeon: Lucilla Lame, MD;  Location: [facility];  Service: Endoscopy;;   TOTAL ABDOMINAL HYSTERECTOMY  2011    Social History   Tobacco Use   Smoking status: Former    Years: 10.00    Pack years: 0.00    Types: Cigarettes    Quit date: 1988    Years since quitting: 34.4   Smokeless tobacco: Never  Vaping Use   Vaping Use: Never used  Substance Use Topics   Alcohol use: Yes    Alcohol/week: 0.0 standard drinks    Comment: may have a drink several times per year   Drug use: No     Medication list has been reviewed and updated.  Current Meds  Medication Sig  albuterol (VENTOLIN HFA) 108 (90 Base) MCG/ACT inhaler Inhale 2 puffs into the lungs every 6 (six) hours as needed for wheezing or shortness of breath.   diclofenac (VOLTAREN) 75 MG EC tablet Take 75 mg by mouth 2 (two) times daily as needed.   diclofenac Sodium (VOLTAREN) 1 % GEL Apply topically as needed.   diphenhydrAMINE (SOMINEX) 25 MG tablet Take 25 mg by mouth at bedtime as needed for sleep (benadryl).    Doxepin HCl 5 % CREA Apply topically.   methocarbamol (ROBAXIN) 500 MG tablet Take 1 tablet (500 mg total) by mouth daily.   NONFORMULARY OR COMPOUNDED ITEM    oxyCODONE-acetaminophen (PERCOCET) 10-325 MG tablet Take 1-2 tablets by mouth every 4 (four) hours.   Probiotic Product (PROBIOTIC DAILY PO) Take by mouth daily.   Pseudoephedrine-APAP-DM (DAYQUIL PO) Take by mouth as needed.   [DISCONTINUED] lansoprazole (PREVACID) 30 MG capsule TAKE 1 CAPSULE (30 MG TOTAL) BY MOUTH DAILY AT 12 NOON.    [DISCONTINUED] lisinopril-hydrochlorothiazide (ZESTORETIC) 20-12.5 MG tablet Take 1 tablet by mouth daily.   [DISCONTINUED] SUMAtriptan (IMITREX) 100 MG tablet Take 1 tablet (100 mg total) by mouth daily as needed.    PHQ 2/9 Scores 11/25/2020 10/27/2020 05/27/2020 11/20/2019  PHQ - 2 Score 0 1 0 0  PHQ- 9 Score 4 5 3  0    GAD 7 : Generalized Anxiety Score 11/25/2020 10/27/2020 05/27/2020 11/20/2019  Nervous, Anxious, on Edge 1 0 1 0  Control/stop worrying 0 0 1 0  Worry too much - different things 0 0 1 0  Trouble relaxing 1 2 1  0  Restless 0 0 0 0  Easily annoyed or irritable 0 1 0 0  Afraid - awful might happen 0 0 0 0  Total GAD 7 Score 2 3 4  0  Anxiety Difficulty - - - Not difficult at all    BP Readings from Last 3 Encounters:  11/25/20 140/84  10/27/20 132/82  05/27/20 112/78    Physical Exam Vitals and nursing note reviewed.  Constitutional:      General: She is not in acute distress.    Appearance: She is well-developed.  HENT:     Head: Normocephalic and atraumatic.     Salivary Glands: Left salivary gland is diffusely enlarged and tender.      Right Ear: Tympanic membrane and ear canal normal.     Left Ear: Tympanic membrane and ear canal normal.     Nose:     Right Sinus: No maxillary sinus tenderness.     Left Sinus: No maxillary sinus tenderness.  Eyes:     General: No scleral icterus.       Right eye: No discharge.        Left eye: No discharge.     Conjunctiva/sclera: Conjunctivae normal.  Neck:     Thyroid: No thyromegaly.     Vascular: No carotid bruit.  Cardiovascular:     Rate and Rhythm: Normal rate and regular rhythm.     Pulses: Normal pulses.     Heart sounds: Normal heart sounds.  Pulmonary:     Effort: Pulmonary effort is normal. No respiratory distress.     Breath sounds: No wheezing.  Chest:  Breasts:    Right: No mass, nipple discharge, skin change or tenderness.     Left: No mass, nipple discharge, skin change or tenderness.   Abdominal:     General: Bowel sounds are normal. There is no distension.     Palpations: Abdomen is  soft. There is no mass.     Tenderness: There is abdominal tenderness.  Musculoskeletal:     Cervical back: Normal range of motion. No erythema.     Right lower leg: No edema.     Left lower leg: No edema.  Lymphadenopathy:     Cervical: No cervical adenopathy.  Skin:    General: Skin is warm and dry.     Capillary Refill: Capillary refill takes less than 2 seconds.     Coloration: Skin is jaundiced.     Findings: No rash.  Neurological:     General: No focal deficit present.     Mental Status: She is alert and oriented to person, place, and time.     Cranial Nerves: No cranial nerve deficit.     Sensory: No sensory deficit.     Deep Tendon Reflexes: Reflexes are normal and symmetric.  Psychiatric:        Attention and Perception: Attention normal.        Mood and Affect: Mood normal.    Wt Readings from Last 3 Encounters:  11/25/20 204 lb (92.5 kg)  10/27/20 206 lb (93.4 kg)  05/27/20 210 lb (95.3 kg)    BP 140/84   Pulse 64   Temp 98 F (36.7 C) (Oral)   Ht 5\' 6"  (1.676 m)   Wt 204 lb (92.5 kg)   SpO2 100%   BMI 32.93 kg/m   Assessment and Plan: 1. Annual physical exam Exam is normal except for weight. Encourage regular exercise and appropriate dietary changes. - Lipid panel  2. Migraine without aura and without status migrainosus, not intractable Sample of Ubrelvy and Nurtec given - SUMAtriptan (IMITREX) 100 MG tablet; Take 1 tablet (100 mg total) by mouth daily as needed.  Dispense: 9 tablet; Refill: 5  3. Encounter for screening mammogram for breast cancer scheduled  4. H/O peptic ulcer Symptoms well controlled on daily PPI No red flag signs such as weight loss, n/v, melena Will continue prevacid. - lansoprazole (PREVACID) 30 MG capsule; Take 1 capsule (30 mg total) by mouth daily at 12 noon.  Dispense: 90 capsule; Refill: 1  5. Essential (primary)  hypertension Clinically stable exam with well controlled BP. Tolerating medications without side effects at this time. Pt to continue current regimen and low sodium diet; benefits of regular exercise as able discussed. - lisinopril-hydrochlorothiazide (ZESTORETIC) 20-12.5 MG tablet; Take 1 tablet by mouth daily.  Dispense: 90 tablet; Refill: 3 - CBC with Differential/Platelet - Comprehensive metabolic panel - POCT urinalysis dipstick  6. Chronic idiopathic constipation Failed multiple agents Recommend follow up with GI - TSH  7. Recurrent oral ulcers - Iron, TIBC and Ferritin Panel   Partially dictated using Editor, commissioning. Any errors are unintentional.  Halina Maidens, MD [facility]  11/25/2020

## 2020-11-26 LAB — LIPID PANEL
Chol/HDL Ratio: 4.7 ratio — ABNORMAL HIGH (ref 0.0–4.4)
Cholesterol, Total: 270 mg/dL — ABNORMAL HIGH (ref 100–199)
HDL: 58 mg/dL (ref >39)
LDL Chol Calc (NIH): 180 mg/dL — ABNORMAL HIGH (ref 0–99)
Triglycerides: 175 mg/dL — ABNORMAL HIGH (ref 0–149)
VLDL Cholesterol Cal: 32 mg/dL (ref 5–40)

## 2020-11-26 LAB — CBC WITH DIFFERENTIAL/PLATELET
Basophils Absolute: 0.1 10*3/uL (ref 0.0–0.2)
Basos: 1 %
EOS (ABSOLUTE): 0.2 10*3/uL (ref 0.0–0.4)
Eos: 4 %
Hematocrit: 36.2 % (ref 34.0–46.6)
Hemoglobin: 11.9 g/dL (ref 11.1–15.9)
Immature Grans (Abs): 0 10*3/uL (ref 0.0–0.1)
Immature Granulocytes: 0 %
Lymphocytes Absolute: 1.5 10*3/uL (ref 0.7–3.1)
Lymphs: 43 %
MCH: 28.7 pg (ref 26.6–33.0)
MCHC: 32.9 g/dL (ref 31.5–35.7)
MCV: 87 fL (ref 79–97)
Monocytes Absolute: 0.4 10*3/uL (ref 0.1–0.9)
Monocytes: 11 %
Neutrophils Absolute: 1.5 10*3/uL (ref 1.4–7.0)
Neutrophils: 41 %
Platelets: 304 10*3/uL (ref 150–450)
RBC: 4.15 x10E6/uL (ref 3.77–5.28)
RDW: 12.7 % (ref 11.7–15.4)
WBC: 3.6 10*3/uL (ref 3.4–10.8)

## 2020-11-26 LAB — COMPREHENSIVE METABOLIC PANEL
ALT: 9 IU/L (ref 0–32)
AST: 15 IU/L (ref 0–40)
Albumin/Globulin Ratio: 1.9 (ref 1.2–2.2)
Albumin: 4.5 g/dL (ref 3.8–4.9)
Alkaline Phosphatase: 91 IU/L (ref 44–121)
BUN/Creatinine Ratio: 20 (ref 9–23)
BUN: 18 mg/dL (ref 6–24)
Bilirubin Total: 0.3 mg/dL (ref 0.0–1.2)
CO2: 26 mmol/L (ref 20–29)
Calcium: 9.5 mg/dL (ref 8.7–10.2)
Chloride: 98 mmol/L (ref 96–106)
Creatinine, Ser: 0.91 mg/dL (ref 0.57–1.00)
Globulin, Total: 2.4 g/dL (ref 1.5–4.5)
Glucose: 89 mg/dL (ref 65–99)
Potassium: 4.3 mmol/L (ref 3.5–5.2)
Sodium: 137 mmol/L (ref 134–144)
Total Protein: 6.9 g/dL (ref 6.0–8.5)
eGFR: 73 mL/min/{1.73_m2} (ref >59)

## 2020-11-26 LAB — IRON,TIBC AND FERRITIN PANEL
Ferritin: 77 ng/mL (ref 15–150)
Iron Saturation: 23 % (ref 15–55)
Iron: 77 ug/dL (ref 27–159)
Total Iron Binding Capacity: 334 ug/dL (ref 250–450)
UIBC: 257 ug/dL (ref 131–425)

## 2020-11-26 LAB — TSH: TSH: 1.42 u[IU]/mL (ref 0.450–4.500)

## 2020-12-11 ENCOUNTER — Other Ambulatory Visit: Payer: Self-pay | Admitting: Gastroenterology

## 2021-03-30 ENCOUNTER — Other Ambulatory Visit: Payer: Self-pay

## 2021-03-30 ENCOUNTER — Ambulatory Visit: Payer: Self-pay

## 2021-03-30 ENCOUNTER — Encounter: Payer: Self-pay | Admitting: Internal Medicine

## 2021-03-30 ENCOUNTER — Ambulatory Visit (INDEPENDENT_AMBULATORY_CARE_PROVIDER_SITE_OTHER): Payer: BC Managed Care – PPO | Admitting: Internal Medicine

## 2021-03-30 VITALS — BP 138/78 | HR 84 | Temp 98.5°F | Ht 66.0 in | Wt 207.8 lb

## 2021-03-30 DIAGNOSIS — J4521 Mild intermittent asthma with (acute) exacerbation: Secondary | ICD-10-CM

## 2021-03-30 MED ORDER — ALBUTEROL SULFATE (2.5 MG/3ML) 0.083% IN NEBU: 2.5000 mg | INHALATION_SOLUTION | Freq: Four times a day (QID) | RESPIRATORY_TRACT | 1 refills | Status: DC | PRN

## 2021-03-30 MED ORDER — PREDNISONE 10 MG PO TABS: 10.0000 mg | ORAL_TABLET | ORAL | 0 refills | Status: AC

## 2021-03-30 MED ORDER — GUAIFENESIN-CODEINE 100-10 MG/5ML PO SYRP: 5.0000 mL | ORAL_SOLUTION | Freq: Three times a day (TID) | ORAL | 0 refills | Status: DC | PRN

## 2021-03-30 MED ORDER — AZITHROMYCIN 250 MG PO TABS: ORAL_TABLET | ORAL | 0 refills | Status: AC

## 2021-03-30 NOTE — Progress Notes (Signed)
Date:  03/30/2021   Name:  Alicia Nichols   DOB:  January 11, 1961   MRN:  [id number]   Chief Complaint: Cough  Cough This is a new problem. The current episode started in the past 7 days. The problem has been waxing and waning. The cough is Non-productive. Associated symptoms include shortness of breath and wheezing. Pertinent negatives include no chills, fever or headaches. The symptoms are aggravated by lying down. Her past medical history is significant for environmental allergies.   Lab Results  Component Value Date   CREATININE 0.91 11/25/2020   BUN 18 11/25/2020   NA 137 11/25/2020   K 4.3 11/25/2020   CL 98 11/25/2020   CO2 26 11/25/2020   Lab Results  Component Value Date   CHOL 270 (H) 11/25/2020   HDL 58 11/25/2020   LDLCALC 180 (H) 11/25/2020   TRIG 175 (H) 11/25/2020   CHOLHDL 4.7 (H) 11/25/2020   Lab Results  Component Value Date   TSH 1.420 11/25/2020   No results found for: HGBA1C Lab Results  Component Value Date   WBC 3.6 11/25/2020   HGB 11.9 11/25/2020   HCT 36.2 11/25/2020   MCV 87 11/25/2020   PLT 304 11/25/2020   Lab Results  Component Value Date   ALT 9 11/25/2020   AST 15 11/25/2020   ALKPHOS 91 11/25/2020   BILITOT 0.3 11/25/2020     Review of Systems  Constitutional:  Positive for fatigue. Negative for chills, fever and unexpected weight change.  Respiratory:  Positive for cough, shortness of breath and wheezing.   Cardiovascular:  Negative for palpitations.  Allergic/Immunologic: Positive for environmental allergies.  Neurological:  Negative for dizziness and headaches.   Patient Active Problem List   Diagnosis Date Noted   Chronic idiopathic constipation 11/20/2019   Epigastric pain 05/16/2018   Drug-induced constipation 11/10/2017   Polyp of sigmoid colon    Hyperlipidemia, mild 11/10/2016   ACL (anterior cruciate ligament) tear 10/21/2015   Rosacea 10/21/2015   Fibrocystic breast changes 06/06/2015   Essential (primary)  hypertension 12/03/2014   Urge incontinence 12/03/2014   H/O peptic ulcer 12/03/2014   Fibromyalgia 12/03/2014   Migraine without aura and without status migrainosus, not intractable 12/03/2014   Clinical depression 12/03/2014   Mild intermittent asthma without complication 22/97/9892   Hot flash, menopausal 12/03/2014   Idiopathic insomnia 12/03/2014   Family history of breast cancer 12/03/2014    Allergies  Allergen Reactions   Aspirin Shortness Of Breath   Hydrocodone Shortness Of Breath   Ambien [Zolpidem]     Suicidal, also caused vision issues   Bupropion Hcl     intolerance   Divalproex Sodium Er Nausea And Vomiting   Duloxetine Hcl     intolerance   Escitalopram     intolerance   Macrobid [Nitrofurantoin]     Severe abdominal pain   Morphine Sulfate Itching   Naproxen     delusions   Ampicillin Rash   Codeine Itching and Rash        Latex Rash    Gloves - when worn for extended period   Oxybutynin Other (See Comments)    Headache   Sulfa Antibiotics Rash    Past Surgical History:  Procedure Laterality Date   arm surgery Left 2018   reduction osteotomy   BACK SURGERY     CATARACT EXTRACTION W/PHACO Right 04/16/2019   Procedure: CATARACT EXTRACTION PHACO AND INTRAOCULAR LENS PLACEMENT ([facility]) RIGHT SYMFONY LENS 00:31.0  9.5%  3.03;  Surgeon: Eulogio Bear, MD;  Location: [place];  Service: Ophthalmology;  Laterality: Right;  Latex   CATARACT EXTRACTION W/PHACO Left 05/14/2019   Procedure: CATARACT EXTRACTION PHACO AND INTRAOCULAR LENS PLACEMENT ([facility]) LEFT SYMFONY LENS, 0.25, 00:04.9;  Surgeon: Eulogio Bear, MD;  Location: [place];  Service: Ophthalmology;  Laterality: Left;   COLONOSCOPY WITH PROPOFOL N/A 12/16/2016   Procedure: COLONOSCOPY WITH PROPOFOL;  Surgeon: Lucilla Lame, MD;  Location: [place];  Service: Endoscopy;  Laterality: N/A;   HAND SURGERY Left 2008   trauma surgery after dog bite   KNEE ARTHROSCOPY      LAMINECTOMY  1992   MENISCUS REPAIR Right 01/2014   POLYPECTOMY  12/16/2016   Procedure: POLYPECTOMY INTESTINAL;  Surgeon: Lucilla Lame, MD;  Location: [facility];  Service: Endoscopy;;   TOTAL ABDOMINAL HYSTERECTOMY  2011    Social History   Tobacco Use   Smoking status: Former    Years: 10.00    Types: Cigarettes    Quit date: 1988    Years since quitting: 34.8   Smokeless tobacco: Never  Vaping Use   Vaping Use: Never used  Substance Use Topics   Alcohol use: Yes    Alcohol/week: 0.0 standard drinks    Comment: may have a drink several times per year   Drug use: No     Medication list has been reviewed and updated.  Current Meds  Medication Sig   albuterol (VENTOLIN HFA) 108 (90 Base) MCG/ACT inhaler Inhale 2 puffs into the lungs every 6 (six) hours as needed for wheezing or shortness of breath.   diclofenac (VOLTAREN) 75 MG EC tablet Take 75 mg by mouth 2 (two) times daily as needed.   diclofenac Sodium (VOLTAREN) 1 % GEL Apply topically as needed.   diphenhydrAMINE (SOMINEX) 25 MG tablet Take 25 mg by mouth at bedtime as needed for sleep (benadryl).    Doxepin HCl 5 % CREA Apply topically.   lansoprazole (PREVACID) 30 MG capsule Take 1 capsule (30 mg total) by mouth daily at 12 noon.   lisinopril-hydrochlorothiazide (ZESTORETIC) 20-12.5 MG tablet Take 1 tablet by mouth daily.   methocarbamol (ROBAXIN) 500 MG tablet Take 1 tablet (500 mg total) by mouth daily.   NONFORMULARY OR COMPOUNDED ITEM    oxyCODONE-acetaminophen (PERCOCET) 10-325 MG tablet Take 1-2 tablets by mouth every 4 (four) hours.   Probiotic Product (PROBIOTIC DAILY PO) Take by mouth daily.   Pseudoephedrine-APAP-DM (DAYQUIL PO) Take by mouth as needed.   SUMAtriptan (IMITREX) 100 MG tablet Take 1 tablet (100 mg total) by mouth daily as needed.    PHQ 2/9 Scores 03/30/2021 11/25/2020 10/27/2020 05/27/2020  PHQ - 2 Score 1 0 1 0  PHQ- 9 Score 6 4 5 3     GAD 7 : Generalized Anxiety Score  03/30/2021 11/25/2020 10/27/2020 05/27/2020  Nervous, Anxious, on Edge 0 1 0 1  Control/stop worrying 0 0 0 1  Worry too much - different things 0 0 0 1  Trouble relaxing 0 1 2 1   Restless 0 0 0 0  Easily annoyed or irritable 0 0 1 0  Afraid - awful might happen 0 0 0 0  Total GAD 7 Score 0 2 3 4   Anxiety Difficulty Not difficult at all - - -    BP Readings from Last 3 Encounters:  03/30/21 138/78  11/25/20 140/84  10/27/20 132/82    Physical Exam Constitutional:      Appearance: Normal appearance.  Cardiovascular:     Rate and Rhythm: Normal rate and regular rhythm.     Pulses: Normal pulses.  Pulmonary:     Effort: Pulmonary effort is normal.     Breath sounds: Normal breath sounds. No wheezing or rhonchi.  Musculoskeletal:     Cervical back: Normal range of motion.     Right lower leg: No edema.     Left lower leg: No edema.  Lymphadenopathy:     Cervical: No cervical adenopathy.  Skin:    General: Skin is warm and dry.  Neurological:     Mental Status: She is alert.    Wt Readings from Last 3 Encounters:  03/30/21 207 lb 12.8 oz (94.3 kg)  11/25/20 204 lb (92.5 kg)  10/27/20 206 lb (93.4 kg)    BP 138/78   Pulse 84   Temp 98.5 F (36.9 C) (Oral)   Ht 5\' 6"  (1.676 m)   Wt 207 lb 12.8 oz (94.3 kg)   SpO2 96%   BMI 33.54 kg/m   Assessment and Plan: 1. Asthma in adult, mild intermittent, with acute exacerbation Continue Albuterol MDI every 3 hours OR albuterol nebs if she can afford the machine - azithromycin (ZITHROMAX Z-PAK) 250 MG tablet; UAD  Dispense: 6 each; Refill: 0 - predniSONE (DELTASONE) 10 MG tablet; Take 1 tablet (10 mg total) by mouth as directed for 6 days. Take 6,5,4,3,2,1 then stop  Dispense: 21 tablet; Refill: 0 - guaiFENesin-codeine (ROBITUSSIN AC) 100-10 MG/5ML syrup; Take 5 mLs by mouth 3 (three) times daily as needed for cough.  Dispense: 180 mL; Refill: 0 - For home use only DME Nebulizer machine - albuterol (PROVENTIL) (2.5 MG/3ML)  0.083% nebulizer solution; Take 3 mLs (2.5 mg total) by nebulization every 6 (six) hours as needed for wheezing or shortness of breath.  Dispense: 150 mL; Refill: 1   Partially dictated using Editor, commissioning. Any errors are unintentional.  Halina Maidens, MD [facility]  03/30/2021

## 2021-03-30 NOTE — Telephone Encounter (Signed)
Pt c/o 1 week h/o dry cough and wheezing. Pt stated she is SOB at rest and worse with activity. Pt ha been using Albuterol MDI approximately twice daily and has been helping. Pt c/o fatigue.  Pt stated that there was a major water leak and fans are running and she stated the dust and "spores" are making her miserable.  Advised pt to get into 1 room with a Hepa filter and humidifier- pt stated she had already tried that.  Unable to find an appt today, called office and spoke with [facility]. Discussed triage with Estill Bamberg and she requested call to be transferred so she could speak to the pt.  Care advice was given and pt verbalized understanding.      Reason for Disposition  [1] MILD difficulty breathing (e.g., minimal/no SOB at rest, SOB with walking, pulse <100) AND [2] NEW-onset or WORSE than normal  Answer Assessment - Initial Assessment Questions 1. RESPIRATORY STATUS: "Describe your breathing?" (e.g., wheezing, shortness of breath, unable to speak, severe coughing)      Wheezing,SOB at rest, severe coughing 2. ONSET: "When did this breathing problem begin?"      1 week ago  3. PATTERN "Does the difficult breathing come and go, or has it been constant since it started?"      constant 4. SEVERITY: "How bad is your breathing?" (e.g., mild, moderate, severe)    - MILD: No SOB at rest, mild SOB with walking, speaks normally in sentences, can lie down, no retractions, pulse < 100.    - MODERATE: SOB at rest, SOB with minimal exertion and prefers to sit, cannot lie down flat, speaks in phrases, mild retractions, audible wheezing, pulse 100-120.    - SEVERE: Very SOB at rest, speaks in single words, struggling to breathe, sitting hunched forward, retractions, pulse > 120      moderate 5. RECURRENT SYMPTOM: "Have you had difficulty breathing before?" If Yes, ask: "When was the last time?" and "What happened that time?"      Yes-  6. CARDIAC HISTORY: "Do you have any history of heart disease?"  (e.g., heart attack, angina, bypass surgery, angioplasty)      no 7. LUNG HISTORY: "Do you have any history of lung disease?"  (e.g., pulmonary embolus, asthma, emphysema)     Asthma seasonal - 8. CAUSE: "What do you think is causing the breathing problem?"      Water leak dust 9. OTHER SYMPTOMS: "Do you have any other symptoms? (e.g., dizziness, runny nose, cough, chest pain, fever)     Dry cough, wheezing, stuffy nose,  10. O2 SATURATION MONITOR:  "Do you use an oxygen saturation monitor (pulse oximeter) at home?" If Yes, "What is your reading (oxygen level) today?" "What is your usual oxygen saturation reading?" (e.g., 95%)       N/a 11. PREGNANCY: "Is there any chance you are pregnant?" "When was your last menstrual period?"       N/a 12. TRAVEL: "Have you traveled out of the country in the last month?" (e.g., travel history, exposures)       N/a  Protocols used: Breathing Difficulty-A-AH

## 2021-05-21 ENCOUNTER — Other Ambulatory Visit: Payer: Self-pay | Admitting: Internal Medicine

## 2021-05-21 DIAGNOSIS — Z8711 Personal history of peptic ulcer disease: Secondary | ICD-10-CM

## 2021-05-21 NOTE — Telephone Encounter (Signed)
Requested Prescriptions  Pending Prescriptions Disp Refills   lansoprazole (PREVACID) 30 MG capsule [Pharmacy Med Name: LANSOPRAZOLE DR 30 MG CAPSULE] 90 capsule 1    Sig: TAKE 1 CAPSULE (30 MG TOTAL) BY MOUTH DAILY AT 12 NOON.     Gastroenterology: Proton Pump Inhibitors Passed - 05/21/2021  1:47 AM      Passed - Valid encounter within last 12 months    Recent Outpatient Visits          1 month ago Asthma in adult, mild intermittent, with acute exacerbation   [facility] Glean Hess, MD   5 months ago Annual physical exam   [facility] Glean Hess, MD   6 months ago Acute non-recurrent maxillary sinusitis   [facility] Glean Hess, MD   11 months ago Recurrent UTI (urinary tract infection)   [facility] Glean Hess, MD   1 year ago [facility] Glean Hess, MD      Future Appointments            In 1 week Army Melia Jesse Sans, MD [facility], [place]   In 6 months Army Melia Jesse Sans, MD [facility], [facility]

## 2021-05-25 ENCOUNTER — Ambulatory Visit: Payer: Self-pay | Admitting: *Deleted

## 2021-05-25 NOTE — Telephone Encounter (Signed)
Per agent: "Pt stated she was at her child's house over the weekend and they have now tested positive for Covid. Pt would like to know if there is something she should be doing or medications she should begin. Pt requests call back to advise. Cb# [phone]"   Chief Complaint: Covid exposure Symptoms: None Frequency: NA Pertinent Negatives: Patient denies symptoms of covid Disposition: [] ED /[] Urgent Care (no appt availability in office) / [] Appointment(In office/virtual)/ []  [facility]/ [x] Home Care/ [] Refused Recommended Disposition  Additional Notes: Home care advise. Pt verbalizes understanding.       Reason for Disposition  [1] CLOSE CONTACT COVID-19 EXPOSURE within last 14 days AND [2] NO symptoms  Answer Assessment - Initial Assessment Questions 1. COVID-19 EXPOSURE: "Please describe how you were exposed to someone with a COVID-19 infection."     Household 2. PLACE of CONTACT: "Where were you when you were exposed to COVID-19?" (e.g., home, school, medical waiting room; which city?)     Home 3. TYPE of CONTACT: "How much contact was there?" (e.g., sitting next to, live in same house, work in same office, same building)     Home 4. DURATION of CONTACT: "How long were you in contact with the COVID-19 patient?" (e.g., a few seconds, passed by person, a few minutes, 15 minutes or longer, live with the patient)     >24hrs 5. MASK: "Were you wearing a mask?" "Was the other person wearing a mask?" Note: wearing a mask reduces the risk of an otherwise close contact.     no 6. DATE of CONTACT: "When did you have contact with a COVID-19 patient?" (e.g., how many days ago)     SAturday 7. COMMUNITY SPREAD: "Are there lots of cases of COVID-19 (community spread) where you live?" (See public health department website, if unsure)       yes 8. SYMPTOMS: "Do you have any symptoms?" (e.g., fever, cough, breathing difficulty, loss of taste or smell)     none 9. VACCINE:  "Have you gotten the COVID-19 vaccine?" If Yes, ask: "Which one, how many shots, when did you get it?"     Yes vaccines 10. BOOSTER: "Have you received your COVID-19 booster?" If Yes, ask: "Which one and when did you get it?"         12. HIGH RISK: "Do you have any heart or lung problems?" (e.g., asthma , COPD, heart failure) "Do you have a weak immune system or other risk factors?" (e.g., HIV positive, chemotherapy, renal failure, diabetes mellitus, sickle cell anemia, obesity) no  Protocols used: Coronavirus (COVID-19) Exposure-A-AH

## 2021-05-29 ENCOUNTER — Telehealth: Payer: Self-pay

## 2021-05-29 ENCOUNTER — Encounter: Payer: BC Managed Care – PPO | Admitting: Internal Medicine

## 2021-05-29 ENCOUNTER — Encounter: Payer: Self-pay | Admitting: Internal Medicine

## 2021-05-29 ENCOUNTER — Other Ambulatory Visit: Payer: Self-pay

## 2021-05-29 ENCOUNTER — Telehealth: Payer: Self-pay | Admitting: Internal Medicine

## 2021-05-29 NOTE — Telephone Encounter (Signed)
Called pt a 3rd time no answer.  KP

## 2021-05-29 NOTE — Telephone Encounter (Signed)
Scheduled for 1140 AM today

## 2021-05-29 NOTE — Telephone Encounter (Signed)
Patient called the office back. Said she did not know she had an appointment. Explained to her we were trying to reach her to start the appt so she can receive care before the weekend.   She said all she had was cold.  Told her to rest, take tylenol as needed, and hydrate. Told her to go to the ER if she gets weak, has a fever that does not break, or has shortness of breath at rest.

## 2021-05-29 NOTE — Telephone Encounter (Signed)
Pt called in and stated they she tested positive for Covid. We had an available appt today 05/29/21 at 11:40 via VV. Called pt to have a VV with Dr. Army Melia called 2 times pt did not answer left VM stating we had an appt available.  [facility] nurse may give results to patient if they return call to clinic, a CRM has been created.  KP

## 2021-05-29 NOTE — Telephone Encounter (Signed)
Copied from [place] [id number]. Topic: General - Other >> May 29, 2021 11:07 AM Yvette Rack wrote: Reason for CRM: Pt reports that she tested positive for Covid on Wednesday 05/27/21 and she would like to know if she could get a Rx for the medication sent to [id number] - [place], [place]. Pt stated if Rx is approved she will need the lowest dose because she does not do well with medications.

## 2021-05-29 NOTE — Progress Notes (Signed)
Date:  05/29/2021   Name:  Alicia Nichols   DOB:  1960-07-27   MRN:  [id number]   Chief Complaint: No chief complaint on file.  URI    Lab Results  Component Value Date   NA 137 11/25/2020   K 4.3 11/25/2020   CO2 26 11/25/2020   GLUCOSE 89 11/25/2020   BUN 18 11/25/2020   CREATININE 0.91 11/25/2020   CALCIUM 9.5 11/25/2020   EGFR 73 11/25/2020   GFRNONAA 83 11/20/2019   Lab Results  Component Value Date   CHOL 270 (H) 11/25/2020   HDL 58 11/25/2020   LDLCALC 180 (H) 11/25/2020   TRIG 175 (H) 11/25/2020   CHOLHDL 4.7 (H) 11/25/2020   Lab Results  Component Value Date   TSH 1.420 11/25/2020   No results found for: HGBA1C Lab Results  Component Value Date   WBC 3.6 11/25/2020   HGB 11.9 11/25/2020   HCT 36.2 11/25/2020   MCV 87 11/25/2020   PLT 304 11/25/2020   Lab Results  Component Value Date   ALT 9 11/25/2020   AST 15 11/25/2020   ALKPHOS 91 11/25/2020   BILITOT 0.3 11/25/2020   No results found for: 25OHVITD2, 25OHVITD3, VD25OH   Review of Systems  Patient Active Problem List   Diagnosis Date Noted   Chronic idiopathic constipation 11/20/2019   Epigastric pain 05/16/2018   Drug-induced constipation 11/10/2017   Polyp of sigmoid colon    Hyperlipidemia, mild 11/10/2016   ACL (anterior cruciate ligament) tear 10/21/2015   Rosacea 10/21/2015   Fibrocystic breast changes 06/06/2015   Essential (primary) hypertension 12/03/2014   Urge incontinence 12/03/2014   H/O peptic ulcer 12/03/2014   Fibromyalgia 12/03/2014   Migraine without aura and without status migrainosus, not intractable 12/03/2014   Clinical depression 12/03/2014   Mild intermittent asthma without complication 54/27/0623   Hot flash, menopausal 12/03/2014   Idiopathic insomnia 12/03/2014   Family history of breast cancer 12/03/2014    Allergies  Allergen Reactions   Aspirin Shortness Of Breath   Hydrocodone Shortness Of Breath   Ambien [Zolpidem]     Suicidal, also caused  vision issues   Bupropion Hcl     intolerance   Divalproex Sodium Er Nausea And Vomiting   Duloxetine Hcl     intolerance   Escitalopram     intolerance   Macrobid [Nitrofurantoin]     Severe abdominal pain   Morphine Sulfate Itching   Naproxen     delusions   Ampicillin Rash   Codeine Itching and Rash        Latex Rash    Gloves - when worn for extended period   Oxybutynin Other (See Comments)    Headache   Sulfa Antibiotics Rash    Past Surgical History:  Procedure Laterality Date   arm surgery Left 2018   reduction osteotomy   BACK SURGERY     CATARACT EXTRACTION W/PHACO Right 04/16/2019   Procedure: CATARACT EXTRACTION PHACO AND INTRAOCULAR LENS PLACEMENT ([place]) RIGHT SYMFONY LENS 00:31.0  9.5%  3.03;  Surgeon: Eulogio Bear, MD;  Location: [facility];  Service: Ophthalmology;  Laterality: Right;  Latex   CATARACT EXTRACTION W/PHACO Left 05/14/2019   Procedure: CATARACT EXTRACTION PHACO AND INTRAOCULAR LENS PLACEMENT ([facility]) LEFT SYMFONY LENS, 0.25, 00:04.9;  Surgeon: Eulogio Bear, MD;  Location: [facility];  Service: Ophthalmology;  Laterality: Left;   COLONOSCOPY WITH PROPOFOL N/A 12/16/2016   Procedure: COLONOSCOPY WITH PROPOFOL;  Surgeon:  Lucilla Lame, MD;  Location: [facility];  Service: Endoscopy;  Laterality: N/A;   HAND SURGERY Left 2008   trauma surgery after dog bite   KNEE ARTHROSCOPY     LAMINECTOMY  1992   MENISCUS REPAIR Right 01/2014   POLYPECTOMY  12/16/2016   Procedure: POLYPECTOMY INTESTINAL;  Surgeon: Lucilla Lame, MD;  Location: [facility];  Service: Endoscopy;;   TOTAL ABDOMINAL HYSTERECTOMY  2011    Social History   Tobacco Use   Smoking status: Former    Years: 10.00    Types: Cigarettes    Quit date: 1988    Years since quitting: 35.0   Smokeless tobacco: Never  Vaping Use   Vaping Use: Never used  Substance Use Topics   Alcohol use: Yes    Alcohol/week: 0.0 standard drinks    Comment: may  have a drink several times per year   Drug use: No     Medication list has been reviewed and updated.  No outpatient medications have been marked as taking for the 05/29/21 encounter (Video Visit) with Glean Hess, MD.    [facility] 2/9 Scores 03/30/2021 11/25/2020 10/27/2020 05/27/2020  PHQ - 2 Score 1 0 1 0  PHQ- 9 Score _0 GAD 7 : Generalized Anxiety Score 03/30/2021 11/25/2020 10/27/2020 05/27/2020  Nervous, Anxious, on Edge 0 1 0 1  Control/stop worrying 0 0 0 1  Worry too much - different things 0 0 0 1  Trouble relaxing 0 _1 Restless 0 0 0 0  Easily annoyed or irritable 0 0 1 0  Afraid - awful might happen 0 0 0 0  Total GAD 7 Score 0 _2 Anxiety Difficulty Not difficult at all - - -    BP Readings from Last 3 Encounters:  03/30/21 138/78  11/25/20 140/84  10/27/20 132/82    Physical Exam  Wt Readings from Last 3 Encounters:  03/30/21 207 lb 12.8 oz (94.3 kg)  11/25/20 204 lb (92.5 kg)  10/27/20 206 lb (93.4 kg)    Ht 5' 6" (1.676 m)    BMI 33.54 kg/m   Assessment and Plan:

## 2021-06-02 ENCOUNTER — Ambulatory Visit: Payer: BC Managed Care – PPO | Admitting: Internal Medicine

## 2021-06-12 ENCOUNTER — Other Ambulatory Visit: Payer: Self-pay

## 2021-06-12 ENCOUNTER — Ambulatory Visit (INDEPENDENT_AMBULATORY_CARE_PROVIDER_SITE_OTHER): Payer: BC Managed Care – PPO | Admitting: Internal Medicine

## 2021-06-12 ENCOUNTER — Encounter: Payer: Self-pay | Admitting: Internal Medicine

## 2021-06-12 VITALS — BP 120/70 | HR 70 | Ht 66.0 in | Wt 205.0 lb

## 2021-06-12 DIAGNOSIS — G43009 Migraine without aura, not intractable, without status migrainosus: Secondary | ICD-10-CM

## 2021-06-12 DIAGNOSIS — I10 Essential (primary) hypertension: Secondary | ICD-10-CM | POA: Diagnosis not present

## 2021-06-12 DIAGNOSIS — J4520 Mild intermittent asthma, uncomplicated: Secondary | ICD-10-CM | POA: Diagnosis not present

## 2021-06-12 DIAGNOSIS — Z23 Encounter for immunization: Secondary | ICD-10-CM

## 2021-06-12 DIAGNOSIS — N951 Menopausal and female climacteric states: Secondary | ICD-10-CM | POA: Diagnosis not present
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
## 2021-06-17 ENCOUNTER — Telehealth: Payer: Self-pay

## 2021-06-17 NOTE — Telephone Encounter (Signed)
[facility] referring for Hot flash, menopausal, VMS. ABC to see patient. Called and left voicemail for patient to call back to be scheduled. X2

## 2021-06-18 NOTE — Telephone Encounter (Signed)
Spoke with patient about scheduling she will need to call back per patient.

## 2021-06-19 NOTE — Telephone Encounter (Signed)
Called and left voicemail for patient to call back to be scheduled. 

## 2021-06-22 NOTE — Telephone Encounter (Signed)
Called and left voicemail for patient to call back to be scheduled. 

## 2021-06-23 NOTE — Telephone Encounter (Signed)
Contacting referring provider. Multiple attempts to reach patient were unsuccessful

## 2021-06-26 ENCOUNTER — Encounter: Payer: Self-pay | Admitting: Podiatry

## 2021-06-26 ENCOUNTER — Encounter: Payer: Self-pay | Admitting: Internal Medicine

## 2021-06-26 ENCOUNTER — Ambulatory Visit (INDEPENDENT_AMBULATORY_CARE_PROVIDER_SITE_OTHER): Payer: BC Managed Care – PPO | Admitting: Podiatry

## 2021-06-26 ENCOUNTER — Ambulatory Visit (INDEPENDENT_AMBULATORY_CARE_PROVIDER_SITE_OTHER): Payer: BC Managed Care – PPO | Admitting: Internal Medicine

## 2021-06-26 ENCOUNTER — Other Ambulatory Visit: Payer: Self-pay

## 2021-06-26 VITALS — BP 120/72 | HR 80 | Ht 66.0 in | Wt 206.6 lb

## 2021-06-26 VITALS — Temp 97.9°F

## 2021-06-26 DIAGNOSIS — L03032 Cellulitis of left toe: Secondary | ICD-10-CM

## 2021-06-26 DIAGNOSIS — L600 Ingrowing nail: Secondary | ICD-10-CM | POA: Diagnosis not present

## 2021-06-26 MED ORDER — AMOXICILLIN-POT CLAVULANATE 875-125 MG PO TABS: 1.0000 | ORAL_TABLET | Freq: Two times a day (BID) | ORAL | 0 refills | Status: AC

## 2021-06-26 MED ORDER — GENTAMICIN SULFATE 0.1 % EX CREA: 1.0000 "application " | TOPICAL_CREAM | Freq: Two times a day (BID) | CUTANEOUS | 1 refills | Status: DC

## 2021-06-26 NOTE — Progress Notes (Signed)
° °  Subjective: Patient presents today for evaluation of pain to the medial border left great toe. Patient is concerned for possible ingrown nail.  It is very sensitive to touch.  Patient actually went to her PCP and was prescribed Augmentin and referred here.  She has not gone to pick up the Augmentin yet.  Patient presents today for further treatment and evaluation.  Past Medical History:  Diagnosis Date   Anemia    Asthma    Family history of adverse reaction to anesthesia    sister awareness under anesthesia   Fibromyalgia    GERD (gastroesophageal reflux disease)    Headache    Hypertension    Motion sickness    cars, roller coasters   Osteoarthritis     Objective:  General: Well developed, nourished, in no acute distress, alert and oriented x3   Dermatology: Skin is warm, dry and supple bilateral.  Medial border left great toe appears to be erythematous with evidence of an ingrowing nail. Pain on palpation noted to the border of the nail fold. The remaining nails appear unremarkable at this time. There are no open sores, lesions.  Vascular: Dorsalis Pedis artery and Posterior Tibial artery pedal pulses palpable. No lower extremity edema noted.   Neruologic: Grossly intact via light touch bilateral.  Musculoskeletal: Muscular strength within normal limits in all groups bilateral. Normal range of motion noted to all pedal and ankle joints.   Assesement: #1 Paronychia with ingrowing nail medial border left great toe #2  Cellulitis left great toe  Plan of Care:  1. Patient evaluated.  2. Discussed treatment alternatives and plan of care. Explained nail avulsion procedure and post procedure course to patient. 3. Patient opted for permanent partial nail avulsion of the ingrown portion of the nail.  4. Prior to procedure, local anesthesia infiltration utilized using 3 ml of a 50:50 mixture of 2% plain lidocaine and 0.5% plain marcaine in a normal hallux block fashion and a  betadine prep performed.  5. Partial permanent nail avulsion with chemical matrixectomy performed using [id number] applications of phenol followed by alcohol flush.  6. Light dressing applied.  Post care instructions provided 7.  Prescription for gentamicin 2% cream  8.  Recommend that the patient pick up the Augmentin at the pharmacy prescribed by her PCP and take as directed  9.  Return to clinic 2 weeks.  Edrick Kins, DPM [facility]  Dr. Edrick Kins, DPM    [address], [place] [address]                Office [phone]  Fax [phone]

## 2021-06-26 NOTE — Progress Notes (Signed)
Date:  06/26/2021   Name:  Alicia Nichols   DOB:  [birth year]   MRN:  [id number]   Chief Complaint: Toe Pain (Ingrown toenail. )  Toe Pain  The incident occurred more than 1 week ago. There was no injury mechanism. Pain location: Left Great Toe. The pain is moderate. The pain has been Constant since onset. Patient has tried neosporin, and Epson salt.  The toe became very tender last evening and when she tried to clean it there was a moderate amount of bleeding.  Today is not as tender but still she can not wear shoes.  Lab Results  Component Value Date   NA 137 11/25/2020   K 4.3 11/25/2020   CO2 26 11/25/2020   GLUCOSE 89 11/25/2020   BUN 18 11/25/2020   CREATININE 0.91 11/25/2020   CALCIUM 9.5 11/25/2020   EGFR 73 11/25/2020   GFRNONAA 83 11/20/2019   Lab Results  Component Value Date   CHOL 270 (H) 11/25/2020   HDL 58 11/25/2020   LDLCALC 180 (H) 11/25/2020   TRIG 175 (H) 11/25/2020   CHOLHDL 4.7 (H) 11/25/2020   Lab Results  Component Value Date   TSH 1.420 11/25/2020   No results found for: HGBA1C Lab Results  Component Value Date   WBC 3.6 11/25/2020   HGB 11.9 11/25/2020   HCT 36.2 11/25/2020   MCV 87 11/25/2020   PLT 304 11/25/2020   Lab Results  Component Value Date   ALT 9 11/25/2020   AST 15 11/25/2020   ALKPHOS 91 11/25/2020   BILITOT 0.3 11/25/2020   No results found for: 25OHVITD2, 25OHVITD3, VD25OH   Review of Systems  Constitutional:  Negative for fatigue and fever.  Musculoskeletal:  Positive for arthralgias, gait problem and joint swelling.  Psychiatric/Behavioral:  Negative for dysphoric mood and sleep disturbance. The patient is not nervous/anxious.    Patient Active Problem List   Diagnosis Date Noted   Chronic idiopathic constipation 11/20/2019   Epigastric pain 05/16/2018   Drug-induced constipation 11/10/2017   Polyp of sigmoid colon    Hyperlipidemia, mild 11/10/2016   ACL (anterior cruciate ligament) tear 10/21/2015    Rosacea 10/21/2015   Fibrocystic breast changes 06/06/2015   Essential (primary) hypertension 12/03/2014   Urge incontinence 12/03/2014   H/O peptic ulcer 12/03/2014   Fibromyalgia 12/03/2014   Migraine without aura and without status migrainosus, not intractable 12/03/2014   Clinical depression 12/03/2014   Mild intermittent asthma without complication 38/25/0539   Hot flash, menopausal 12/03/2014   Idiopathic insomnia 12/03/2014   Family history of breast cancer 12/03/2014    Allergies  Allergen Reactions   Aspirin Shortness Of Breath   Hydrocodone Shortness Of Breath   Ambien [Zolpidem]     Suicidal, also caused vision issues   Bupropion Hcl     intolerance   Divalproex Sodium Er Nausea And Vomiting   Duloxetine Hcl     intolerance   Escitalopram     intolerance   Macrobid [Nitrofurantoin]     Severe abdominal pain   Morphine Sulfate Itching   Naproxen     delusions   Ampicillin Rash   Codeine Itching and Rash        Latex Rash    Gloves - when worn for extended period   Oxybutynin Other (See Comments)    Headache   Sulfa Antibiotics Rash    Past Surgical History:  Procedure Laterality Date   arm surgery Left 2018   reduction osteotomy  BACK SURGERY     CATARACT EXTRACTION W/PHACO Right 04/16/2019   Procedure: CATARACT EXTRACTION PHACO AND INTRAOCULAR LENS PLACEMENT (IOC) RIGHT SYMFONY LENS 00:31.0  9.5%  3.03;  Surgeon: Eulogio Bear, MD;  Location: [place];  Service: Ophthalmology;  Laterality: Right;  Latex   CATARACT EXTRACTION W/PHACO Left 05/14/2019   Procedure: CATARACT EXTRACTION PHACO AND INTRAOCULAR LENS PLACEMENT (IOC) LEFT SYMFONY LENS, 0.25, 00:04.9;  Surgeon: Eulogio Bear, MD;  Location: [place];  Service: Ophthalmology;  Laterality: Left;   COLONOSCOPY WITH PROPOFOL N/A 12/16/2016   Procedure: COLONOSCOPY WITH PROPOFOL;  Surgeon: Lucilla Lame, MD;  Location: [place];  Service: Endoscopy;  Laterality:  N/A;   HAND SURGERY Left 2008   trauma surgery after dog bite   KNEE ARTHROSCOPY     LAMINECTOMY  1992   MENISCUS REPAIR Right 01/2014   POLYPECTOMY  12/16/2016   Procedure: POLYPECTOMY INTESTINAL;  Surgeon: Lucilla Lame, MD;  Location: [place];  Service: Endoscopy;;   TOTAL ABDOMINAL HYSTERECTOMY  2011    Social History   Tobacco Use   Smoking status: Former    Years: 10.00    Types: Cigarettes    Quit date: 1988    Years since quitting: 35.0   Smokeless tobacco: Never  Vaping Use   Vaping Use: Never used  Substance Use Topics   Alcohol use: Yes    Alcohol/week: 0.0 standard drinks    Comment: may have a drink several times per year   Drug use: No     Medication list has been reviewed and updated.  Current Meds  Medication Sig   albuterol (PROVENTIL) (2.5 MG/3ML) 0.083% nebulizer solution Take 3 mLs (2.5 mg total) by nebulization every 6 (six) hours as needed for wheezing or shortness of breath.   albuterol (VENTOLIN HFA) 108 (90 Base) MCG/ACT inhaler Inhale 2 puffs into the lungs every 6 (six) hours as needed for wheezing or shortness of breath.   amoxicillin-clavulanate (AUGMENTIN) 875-125 MG tablet Take 1 tablet by mouth 2 (two) times daily for 10 days.   Clocortolone Pivalate (CLODERM) 0.1 % cream Apply 1 application topically 2 (two) times daily.   diclofenac (VOLTAREN) 75 MG EC tablet Take 75 mg by mouth 2 (two) times daily as needed.   diclofenac Sodium (VOLTAREN) 1 % GEL Apply topically as needed.   diphenhydrAMINE (SOMINEX) 25 MG tablet Take 25 mg by mouth at bedtime as needed for sleep (benadryl).    Doxepin HCl 5 % CREA Apply topically.   lansoprazole (PREVACID) 30 MG capsule TAKE 1 CAPSULE (30 MG TOTAL) BY MOUTH DAILY AT 12 NOON.   lisinopril-hydrochlorothiazide (ZESTORETIC) 20-12.5 MG tablet Take 1 tablet by mouth daily.   metaxalone (SKELAXIN) 800 MG tablet Take 800 mg by mouth 3 (three) times daily.   methocarbamol (ROBAXIN) 500 MG tablet Take 1  tablet (500 mg total) by mouth daily.   Probiotic Product (PROBIOTIC DAILY PO) Take by mouth daily.   Pseudoephedrine-APAP-DM (DAYQUIL PO) Take by mouth as needed.   SUMAtriptan (IMITREX) 100 MG tablet Take 1 tablet (100 mg total) by mouth daily as needed.   [DISCONTINUED] oxyCODONE-acetaminophen (PERCOCET) 10-325 MG tablet Take 1-2 tablets by mouth every 4 (four) hours.    PHQ 2/9 Scores 06/26/2021 06/12/2021 03/30/2021 11/25/2020  PHQ - 2 Score 0 6 1 0  PHQ- 9 Score _0 GAD 7 : Generalized Anxiety Score 06/26/2021 06/12/2021 03/30/2021 11/25/2020  Nervous, Anxious, on Edge 1 3  0 1  Control/stop worrying 0 3 0 0  Worry too much - different things 0 3 0 0  Trouble relaxing 0 2 0 1  Restless 0 2 0 0  Easily annoyed or irritable 0 2 0 0  Afraid - awful might happen 0 0 0 0  Total GAD 7 Score 1 15 0 2  Anxiety Difficulty Not difficult at all Somewhat difficult Not difficult at all -    BP Readings from Last 3 Encounters:  06/26/21 120/72  06/12/21 120/70  03/30/21 138/78    Physical Exam Vitals and nursing note reviewed.  Constitutional:      General: She is not in acute distress.    Appearance: She is well-developed.  HENT:     Head: Normocephalic and atraumatic.  Pulmonary:     Effort: Pulmonary effort is normal. No respiratory distress.  Musculoskeletal:       Feet:  Feet:     Comments: Swollen tender dark red with proud flesh along the lateral aspect  Skin:    General: Skin is warm and dry.     Findings: No rash.  Neurological:     Mental Status: She is alert and oriented to person, place, and time.  Psychiatric:        Mood and Affect: Mood normal.        Behavior: Behavior normal.    Wt Readings from Last 3 Encounters:  06/26/21 206 lb 9.6 oz (93.7 kg)  06/12/21 205 lb (93 kg)  03/30/21 207 lb 12.8 oz (94.3 kg)    BP 120/72    Pulse 80    Ht _0  (1.676 m)    Wt 206 lb 9.6 oz (93.7 kg)    SpO2 97%    BMI 33.35 kg/m   Assessment and Plan: 1.  Paronychia of great toe of left foot Continue Epson salt soaks and Neosporin. Will prescribe Augmentin Refer to Podiatry -seeing her today at 12:30 - amoxicillin-clavulanate (AUGMENTIN) 875-125 MG tablet; Take 1 tablet by mouth 2 (two) times daily for 10 days.  Dispense: 20 tablet; Refill: 0 - Ambulatory referral to Podiatry   Partially dictated using Editor, commissioning. Any errors are unintentional.  Halina Maidens, MD [facility]  06/26/2021

## 2021-06-30 ENCOUNTER — Other Ambulatory Visit (HOSPITAL_COMMUNITY)
Admission: RE | Admit: 2021-06-30 | Discharge: 2021-06-30 | Disposition: A | Discharge status: Home / Self Care | Payer: BC Managed Care – PPO | Source: Ambulatory Visit | Attending: Obstetrics and Gynecology | Admitting: Obstetrics and Gynecology

## 2021-06-30 ENCOUNTER — Ambulatory Visit (INDEPENDENT_AMBULATORY_CARE_PROVIDER_SITE_OTHER): Payer: BC Managed Care – PPO | Admitting: Obstetrics and Gynecology

## 2021-06-30 ENCOUNTER — Encounter: Payer: Self-pay | Admitting: Obstetrics and Gynecology

## 2021-06-30 ENCOUNTER — Other Ambulatory Visit: Payer: Self-pay

## 2021-06-30 VITALS — BP 134/70 | Ht 66.0 in | Wt 209.0 lb

## 2021-06-30 DIAGNOSIS — Z87410 Personal history of cervical dysplasia: Secondary | ICD-10-CM | POA: Diagnosis present

## 2021-06-30 DIAGNOSIS — N952 Postmenopausal atrophic vaginitis: Secondary | ICD-10-CM

## 2021-06-30 DIAGNOSIS — Z1151 Encounter for screening for human papillomavirus (HPV): Secondary | ICD-10-CM

## 2021-06-30 DIAGNOSIS — N941 Unspecified dyspareunia: Secondary | ICD-10-CM

## 2021-06-30 DIAGNOSIS — N951 Menopausal and female climacteric states: Secondary | ICD-10-CM

## 2021-06-30 DIAGNOSIS — Z124 Encounter for screening for malignant neoplasm of cervix: Secondary | ICD-10-CM

## 2021-06-30 DIAGNOSIS — Z803 Family history of malignant neoplasm of breast: Secondary | ICD-10-CM

## 2021-06-30 MED ORDER — ESTRADIOL 0.05 MG/24HR TD PTWK: 0.0500 mg | MEDICATED_PATCH | TRANSDERMAL | 3 refills | Status: DC

## 2021-06-30 MED ORDER — PREMARIN 0.625 MG/GM VA CREA: TOPICAL_CREAM | VAGINAL | 1 refills | Status: DC

## 2021-06-30 NOTE — Patient Instructions (Signed)
I value your feedback and you entrusting us with your care. If you get a [facility] patient survey, I would appreciate you taking the time to let us know about your experience today. Thank you! ? ? ?

## 2021-06-30 NOTE — Progress Notes (Signed)
Glean Hess, MD   Chief Complaint  Patient presents with   Hot Flashes    Pain during intercourse, headaches x 2 yrs    HPI:      Ms. Alicia Nichols is a 61 y.o. [phone] whose LMP was No LMP recorded. Patient has had a hysterectomy., presents today for NP eval  of vasomotor sx, referred by PCP. S/p TAH BSO about 13 yrs ago due to pain/bleeding. Did have hx of abn paps prior to hyst, requiring cryotx 1 or 2 times, per pt. Started vivelle dot 1.0 mg after surgery, then changed to 0.5 mg dose. Pt did well immediately, then Rx changed to generic and pt didn't like it as much (doesn't remember issues specifically). Pt did well in terms of vasomotor sx control but would forget to change twice wkly, so stopped ERT about 1-2 yrs ago. Vasomotor sx returned and are significant. Pt is interested in ERT again. No PMB. No recent pap.  Pt also with vaginal dryness and recent dyspareunia, not improved with coconut oil as lub. No bleeding usually.  Pt also with strong FH breast cancer on mat side. Did some type of breast and colon genetic testing in 1990s that was negative. No update testing done. Pt unsure if anyone else has done testing.    Patient Active Problem List   Diagnosis Date Noted   History of cervical dysplasia 06/30/2021   Dyspareunia in female 06/30/2021   Chronic idiopathic constipation 11/20/2019   Epigastric pain 05/16/2018   Drug-induced constipation 11/10/2017   Polyp of sigmoid colon    Hyperlipidemia, mild 11/10/2016   ACL (anterior cruciate ligament) tear 10/21/2015   Rosacea 10/21/2015   Fibrocystic breast changes 06/06/2015   Essential (primary) hypertension 12/03/2014   Urge incontinence 12/03/2014   H/O peptic ulcer 12/03/2014   Fibromyalgia 12/03/2014   Migraine without aura and without status migrainosus, not intractable 12/03/2014   Clinical depression 12/03/2014   Mild intermittent asthma without complication 45/08/8880   Hot flash, menopausal 12/03/2014    Idiopathic insomnia 12/03/2014   Family history of breast cancer 12/03/2014    Past Surgical History:  Procedure Laterality Date   arm surgery Left 2018   reduction osteotomy   BACK SURGERY     CATARACT EXTRACTION W/PHACO Right 04/16/2019   Procedure: CATARACT EXTRACTION PHACO AND INTRAOCULAR LENS PLACEMENT (IOC) RIGHT SYMFONY LENS 00:31.0  9.5%  3.03;  Surgeon: Eulogio Bear, MD;  Location: [place];  Service: Ophthalmology;  Laterality: Right;  Latex   CATARACT EXTRACTION W/PHACO Left 05/14/2019   Procedure: CATARACT EXTRACTION PHACO AND INTRAOCULAR LENS PLACEMENT (IOC) LEFT SYMFONY LENS, 0.25, 00:04.9;  Surgeon: Eulogio Bear, MD;  Location: [place];  Service: Ophthalmology;  Laterality: Left;   COLONOSCOPY WITH PROPOFOL N/A 12/16/2016   Procedure: COLONOSCOPY WITH PROPOFOL;  Surgeon: Lucilla Lame, MD;  Location: [place];  Service: Endoscopy;  Laterality: N/A;   HAND SURGERY Left 2008   trauma surgery after dog bite   KNEE ARTHROSCOPY     LAMINECTOMY  1992   MENISCUS REPAIR Right 01/2014   POLYPECTOMY  12/16/2016   Procedure: POLYPECTOMY INTESTINAL;  Surgeon: Lucilla Lame, MD;  Location: [place];  Service: Endoscopy;;   TOTAL ABDOMINAL HYSTERECTOMY  2011    Family History  Problem Relation Age of Onset   Rheum arthritis Mother    Breast cancer Maternal Aunt        53s   Breast cancer Maternal Aunt  50s/60s   Breast cancer Maternal Grandmother        70s   Breast cancer Cousin        37s   Breast cancer Cousin        52s    Social History   Socioeconomic History   Marital status: Married    Spouse name: Not on file   Number of children: Not on file   Years of education: Not on file   Highest education level: Not on file  Occupational History   Not on file  Tobacco Use   Smoking status: Former    Years: 10.00    Types: Cigarettes    Quit date: 78    Years since quitting: 35.0   Smokeless tobacco:  Never  Vaping Use   Vaping Use: Never used  Substance and Sexual Activity   Alcohol use: Yes    Alcohol/week: 0.0 standard drinks    Comment: may have a drink several times per year   Drug use: No   Sexual activity: Yes    Birth control/protection: Surgical    Comment: Hysterectomy  Other Topics Concern   Not on file  Social History Narrative   Not on file   Social Determinants of Health   Financial Resource Strain: Not on file  Food Insecurity: Not on file  Transportation Needs: Not on file  Physical Activity: Not on file  Stress: Not on file  Social Connections: Not on file  Intimate Partner Violence: Not on file    Outpatient Medications Prior to Visit  Medication Sig Dispense Refill   albuterol (PROVENTIL) (2.5 MG/3ML) 0.083% nebulizer solution Take 3 mLs (2.5 mg total) by nebulization every 6 (six) hours as needed for wheezing or shortness of breath. 150 mL 1   albuterol (VENTOLIN HFA) 108 (90 Base) MCG/ACT inhaler Inhale 2 puffs into the lungs every 6 (six) hours as needed for wheezing or shortness of breath. 18 g 5   amoxicillin-clavulanate (AUGMENTIN) 875-125 MG tablet Take 1 tablet by mouth 2 (two) times daily for 10 days. 20 tablet 0   Clocortolone Pivalate (CLODERM) 0.1 % cream Apply 1 application topically 2 (two) times daily.     diclofenac (VOLTAREN) 75 MG EC tablet Take 75 mg by mouth 2 (two) times daily as needed.     diclofenac Sodium (VOLTAREN) 1 % GEL Apply topically as needed.     diphenhydrAMINE (SOMINEX) 25 MG tablet Take 25 mg by mouth at bedtime as needed for sleep (benadryl).      Doxepin HCl 5 % CREA Apply topically.     gentamicin cream (GARAMYCIN) 0.1 % Apply 1 application topically 2 (two) times daily. 30 g 1   lansoprazole (PREVACID) 30 MG capsule TAKE 1 CAPSULE (30 MG TOTAL) BY MOUTH DAILY AT 12 NOON. 90 capsule 1   lisinopril-hydrochlorothiazide (ZESTORETIC) 20-12.5 MG tablet Take 1 tablet by mouth daily. 90 tablet 3   metaxalone (SKELAXIN) 800  MG tablet Take 800 mg by mouth 3 (three) times daily.     methocarbamol (ROBAXIN) 500 MG tablet Take 1 tablet (500 mg total) by mouth daily. 30 tablet 1   oxyCODONE-acetaminophen (PERCOCET) 10-325 MG tablet Take 1-2 tablets by mouth every 4 (four) hours.     Probiotic Product (PROBIOTIC DAILY PO) Take by mouth daily.     Pseudoephedrine-APAP-DM (DAYQUIL PO) Take by mouth as needed.     SUMAtriptan (IMITREX) 100 MG tablet Take 1 tablet (100 mg total) by mouth daily as needed. 9 tablet  5   No facility-administered medications prior to visit.     ROS:  Review of Systems  Constitutional:  Negative for fever.  Gastrointestinal:  Negative for blood in stool, constipation, diarrhea, nausea and vomiting.  Genitourinary:  Positive for dyspareunia. Negative for dysuria, flank pain, frequency, hematuria, urgency, vaginal bleeding, vaginal discharge and vaginal pain.  Musculoskeletal:  Negative for back pain.  Skin:  Negative for rash.  BREAST: No symptoms   OBJECTIVE:   Vitals:  BP 134/70    Ht $R'5\' 6"'lD$  (1.676 m)    Wt 209 lb (94.8 kg)    BMI 33.73 kg/m   Physical Exam Vitals reviewed.  Constitutional:      Appearance: She is well-developed.  Pulmonary:     Effort: Pulmonary effort is normal.  Genitourinary:    General: Normal vulva.     Pubic Area: No rash.      Labia:        Right: No rash, tenderness or lesion.        Left: No rash, tenderness or lesion.      Vagina: Normal. No vaginal discharge, erythema or tenderness.     Uterus: Absent. Not tender.      Comments: MOD VAG ATROPHY Musculoskeletal:        General: Normal range of motion.     Cervical back: Normal range of motion.  Skin:    General: Skin is warm and dry.  Neurological:     General: No focal deficit present.     Mental Status: She is alert and oriented to person, place, and time.  Psychiatric:        Mood and Affect: Mood normal.        Behavior: Behavior normal.        Thought Content: Thought content  normal.        Judgment: Judgment normal.    Assessment/Plan: Vasomotor symptoms due to menopause - Plan: estradiol (CLIMARA - DOSED IN MG/24 HR) 0.05 mg/24hr patch; will try climara patch since once wkly dosing. Had sx control with ERT in past. Rx eRxd. F/u prn.   Dyspareunia in female - Plan: estradiol (CLIMARA - DOSED IN MG/24 HR) 0.05 mg/24hr patch, conjugated estrogens (PREMARIN) vaginal cream; mod atrophy on exam. Rx prem vag crm, 1 sample given. Cont to use coconut oil. F/u prn.   Vaginal atrophy - Plan: conjugated estrogens (PREMARIN) vaginal cream  Cervical cancer screening - Plan: Cytology - PAP  Screening for HPV (human papillomavirus) - Plan: Cytology - PAP  History of cervical dysplasia - Plan: Cytology - PAP; pap today due to hx of dysplasia  Family history of breast cancer - Plan: Integrated [facility] ([place]); MyRisk testing discussed and done today. Will f/u with results.    Meds ordered this encounter  Medications   estradiol (CLIMARA - DOSED IN MG/24 HR) 0.05 mg/24hr patch    Sig: Place 1 patch (0.05 mg total) onto the skin once a week.    Dispense:  12 patch    Refill:  3    Order Specific Question:   Supervising Provider    Answer:   Gae Dry [[id number]]   conjugated estrogens (PREMARIN) vaginal cream    Sig: Insert 1 g vaginally nightly for 7 nights then 1 g once weekly as maintenance    Dispense:  30 g    Refill:  1    Order Specific Question:   Supervising Provider    Answer:   Gae Dry [[id number]]  Return in about 1 year (around 06/30/2022), or if symptoms worsen or fail to improve.  Jep Dyas B. Lorella Gomez, PA-C 06/30/2021 4:24 PM

## 2021-07-02 LAB — CYTOLOGY - PAP
Comment: NEGATIVE
Diagnosis: NEGATIVE
High risk HPV: NEGATIVE

## 2021-07-08 DIAGNOSIS — Z1371 Encounter for nonprocreative screening for genetic disease carrier status: Secondary | ICD-10-CM

## 2021-07-08 DIAGNOSIS — Z9189 Other specified personal risk factors, not elsewhere classified: Secondary | ICD-10-CM

## 2021-07-08 HISTORY — DX: Encounter for nonprocreative screening for genetic disease carrier status: Z13.71

## 2021-07-08 HISTORY — DX: Other specified personal risk factors, not elsewhere classified: Z91.89

## 2021-07-10 ENCOUNTER — Ambulatory Visit (INDEPENDENT_AMBULATORY_CARE_PROVIDER_SITE_OTHER): Payer: BC Managed Care – PPO | Admitting: Podiatry

## 2021-07-10 ENCOUNTER — Other Ambulatory Visit: Payer: Self-pay

## 2021-07-10 ENCOUNTER — Encounter: Payer: Self-pay | Admitting: Podiatry

## 2021-07-10 DIAGNOSIS — M722 Plantar fascial fibromatosis: Secondary | ICD-10-CM | POA: Diagnosis not present

## 2021-07-10 NOTE — Progress Notes (Signed)
HPI: 61 y.o. female presenting today for follow-up evaluation of an ingrown toenail to the medial border of the left great toe.  Patient states she is doing well.  She completed the oral Augmentin that was prescribed by her PCP.  She has been applying antibiotic cream and soaking her foot as instructed.    Patient actually has new complaints today regarding chronic bilateral foot pain.  She says that she wore orthotics several years prior which did not help.  She has chronic pain almost on a daily basis associated to her feet.  She also complains that her arches of collapse.  She went to [facility] about 3 weeks ago where x-rays were taken but there is been no follow-up.  She presents for further treatment and evaluation  Past Medical History:  Diagnosis Date   Anemia    Asthma    Family history of adverse reaction to anesthesia    sister awareness under anesthesia   Fibromyalgia    GERD (gastroesophageal reflux disease)    Headache    Hypertension    Motion sickness    cars, roller coasters   Osteoarthritis     Past Surgical History:  Procedure Laterality Date   arm surgery Left 2018   reduction osteotomy   BACK SURGERY     CATARACT EXTRACTION W/PHACO Right 04/16/2019   Procedure: CATARACT EXTRACTION PHACO AND INTRAOCULAR LENS PLACEMENT (IOC) RIGHT SYMFONY LENS 00:31.0  9.5%  3.03;  Surgeon: Eulogio Bear, MD;  Location: [place];  Service: Ophthalmology;  Laterality: Right;  Latex   CATARACT EXTRACTION W/PHACO Left 05/14/2019   Procedure: CATARACT EXTRACTION PHACO AND INTRAOCULAR LENS PLACEMENT (IOC) LEFT SYMFONY LENS, 0.25, 00:04.9;  Surgeon: Eulogio Bear, MD;  Location: [place];  Service: Ophthalmology;  Laterality: Left;   COLONOSCOPY WITH PROPOFOL N/A 12/16/2016   Procedure: COLONOSCOPY WITH PROPOFOL;  Surgeon: Lucilla Lame, MD;  Location: [place];  Service: Endoscopy;  Laterality: N/A;   HAND SURGERY Left 2008   trauma surgery  after dog bite   KNEE ARTHROSCOPY     LAMINECTOMY  1992   MENISCUS REPAIR Right 01/2014   POLYPECTOMY  12/16/2016   Procedure: POLYPECTOMY INTESTINAL;  Surgeon: Lucilla Lame, MD;  Location: [place];  Service: Endoscopy;;   TOTAL ABDOMINAL HYSTERECTOMY  2011    Allergies  Allergen Reactions   Aspirin Shortness Of Breath   Hydrocodone Shortness Of Breath   Ambien [Zolpidem]     Suicidal, also caused vision issues   Bupropion Hcl     intolerance   Divalproex Sodium Er Nausea And Vomiting   Duloxetine Hcl     intolerance   Escitalopram     intolerance   Macrobid [Nitrofurantoin]     Severe abdominal pain   Morphine Sulfate Itching   Naproxen     delusions   Ampicillin Rash   Codeine Itching and Rash        Latex Rash    Gloves - when worn for extended period   Oxybutynin Other (See Comments)    Headache   Sulfa Antibiotics Rash     Physical Exam: General: The patient is alert and oriented x3 in no acute distress.  Dermatology: Skin is warm, dry and supple bilateral lower extremities. Negative for open lesions or macerations.  The nail matricectomy site to the medial border of the left great toe appears to be healed.  There is a very small scab to the area of the nail matricectomy site.  This should resolve and fall off naturally over time.  Vascular: Palpable pedal pulses bilaterally. Capillary refill within normal limits.  Negative for any significant edema or erythema  Neurological: Light touch and protective threshold grossly intact  Musculoskeletal Exam: No pedal deformities noted.  There is some tenderness to palpation along the plantar arches of the bilateral feet  Radiographic Exam:  Declined x-rays.  She says that they were taken about 3 weeks ago at [facility] of her feet  Assessment: 1.  Status post partial nail matricectomy left great toe medial border 2.  Chronic plantar fasciitis and foot pain bilateral   Plan of Care:  1. Patient  evaluated. X-Rays reviewed.  2.  Today we discussed possible orthotics for the patient.  She does believe that this would be the best alternative for her. 3.  The patient would like to verify insurance coverage prior to proceeding with an appointment with our Pedorthist.  I gave her the CPT code L3020 to verify with her insurance.  If it is a covered expense then I recommend that she follows up with our Pedorthist for custom molded orthotics 4.  Return to clinic as needed      Edrick Kins, DPM [facility]  Dr. Edrick Kins, DPM    [address], [place] [address]                Office [phone]  Fax [phone]

## 2021-07-14 ENCOUNTER — Encounter: Payer: Self-pay | Admitting: Obstetrics and Gynecology

## 2021-07-30 ENCOUNTER — Telehealth: Payer: Self-pay | Admitting: Obstetrics and Gynecology

## 2021-07-30 DIAGNOSIS — G43019 Migraine without aura, intractable, without status migrainosus: Secondary | ICD-10-CM

## 2021-07-30 NOTE — Telephone Encounter (Signed)
Pt aware of neg MyRisk results. IBIS=14.5%/riskscore=23.7%.  Patient understands these results only apply to her and her children, and this is not indicative of genetic testing results of her other family members. It is recommended that her other family members have genetic testing done.  Pt also understands negative genetic testing doesn't mean she will never get any of these cancers.   Hard copy mailed to pt. F/u prn.   Pt still with migraines with orgasm. Had happened 1-2 times when I saw her, but now frequent and severe. Thinks she should have gone to ED a few times due to severity. Hx of migraines wihtout aura in general but this is new. Will refer to [facility] neuro.

## 2021-09-23 ENCOUNTER — Other Ambulatory Visit: Payer: Self-pay | Admitting: Internal Medicine

## 2021-09-23 DIAGNOSIS — Z8711 Personal history of peptic ulcer disease: Secondary | ICD-10-CM

## 2021-09-24 NOTE — Telephone Encounter (Signed)
Requested Prescriptions  ?Pending Prescriptions Disp Refills  ?? lansoprazole (PREVACID) 30 MG capsule [Pharmacy Med Name: LANSOPRAZOLE DR 30 MG CAPSULE] 90 capsule 1  ?  Sig: TAKE 1 CAPSULE (30 MG TOTAL) BY MOUTH DAILY AT 12 NOON.  ?  ? Gastroenterology: Proton Pump Inhibitors 2 Passed - 09/23/2021  8:02 PM  ?  ?  Passed - ALT in normal range and within 360 days  ?  ALT  ?Date Value Ref Range Status  ?11/25/2020 9 0 - 32 IU/L Final  ?   ?  ?  Passed - AST in normal range and within 360 days  ?  AST  ?Date Value Ref Range Status  ?11/25/2020 15 0 - 40 IU/L Final  ?   ?  ?  Passed - Valid encounter within last 12 months  ?  Recent Outpatient Visits   ?      ? 3 months ago Paronychia of great toe of left foot  ? [facility] Glean Hess, MD  ? 3 months ago Essential (primary) hypertension  ? [facility] Glean Hess, MD  ? 5 months ago Asthma in adult, mild intermittent, with acute exacerbation  ? [facility] Glean Hess, MD  ? 10 months ago Annual physical exam  ? [facility] Glean Hess, MD  ? 11 months ago Acute non-recurrent maxillary sinusitis  ? [facility] Glean Hess, MD  ?  ?  ?Future Appointments   ?        ? In 2 months Army Melia Jesse Sans, MD [facility] [place]  ?  ? ?  ?  ?  ? ?

## 2021-11-10 ENCOUNTER — Other Ambulatory Visit: Payer: Self-pay | Admitting: Internal Medicine

## 2021-11-10 DIAGNOSIS — I10 Essential (primary) hypertension: Secondary | ICD-10-CM

## 2021-11-10 NOTE — Telephone Encounter (Signed)
Requested medication (s) are due for refill today: Due 11/25/21  Requested medication (s) are on the active medication list: yes    Last refill: 11/25/20  #90  3 refills  Future visit scheduled yes 12/01/21  Notes to clinic:Failed due to labs, please review  Requested Prescriptions  Pending Prescriptions Disp Refills   lisinopril-hydrochlorothiazide (ZESTORETIC) 20-12.5 MG tablet [Pharmacy Med Name: LISINOPRIL-HCTZ 20-12.5 MG TAB] 90 tablet 3    Sig: TAKE 1 TABLET BY MOUTH EVERY DAY     Cardiovascular:  ACEI + Diuretic Combos Failed - 11/10/2021 12:55 PM      Failed - Na in normal range and within 180 days    Sodium  Date Value Ref Range Status  11/25/2020 137 134 - 144 mmol/L Final         Failed - K in normal range and within 180 days    Potassium  Date Value Ref Range Status  11/25/2020 4.3 3.5 - 5.2 mmol/L Final         Failed - Cr in normal range and within 180 days    Creatinine, Ser  Date Value Ref Range Status  11/25/2020 0.91 0.57 - 1.00 mg/dL Final         Failed - eGFR is 30 or above and within 180 days    GFR calc Af Amer  Date Value Ref Range Status  11/20/2019 96 >59 mL/min/1.73 Final    Comment:    **[facility] currently reports eGFR in compliance with the current**   recommendations of the [employer]. [facility] will   update reporting as new guidelines are published from the NKF-ASN   Task force.    GFR calc non Af Amer  Date Value Ref Range Status  11/20/2019 83 >59 mL/min/1.73 Final   eGFR  Date Value Ref Range Status  11/25/2020 73 >59 mL/min/1.73 Final         Passed - Patient is not pregnant      Passed - Last BP in normal range    BP Readings from Last 1 Encounters:  06/30/21 134/70         Passed - Valid encounter within last 6 months    Recent Outpatient Visits           4 months ago Paronychia of great toe of left foot   [facility] Glean Hess, MD   5 months ago Essential (primary) hypertension    [facility] Glean Hess, MD   7 months ago Asthma in adult, mild intermittent, with acute exacerbation   [facility] Glean Hess, MD   11 months ago Annual physical exam   [facility] Glean Hess, MD   1 year ago Acute non-recurrent maxillary sinusitis   [facility] Glean Hess, MD       Future Appointments             In 3 weeks Army Melia Jesse Sans, MD [facility]

## 2021-11-25 ENCOUNTER — Telehealth: Payer: Self-pay | Admitting: Internal Medicine

## 2021-11-25 DIAGNOSIS — I10 Essential (primary) hypertension: Secondary | ICD-10-CM

## 2021-11-26 NOTE — Telephone Encounter (Signed)
Pt is calling to report that she has 2 pills left. Pt has CPE on 12/01/21

## 2021-11-26 NOTE — Telephone Encounter (Signed)
Requested medication (s) are due for refill today: Yes  Requested medication (s) are on the active medication list: Yes  Last refill:  11/25/20  Future visit scheduled: Yes  Notes to clinic:  Prescription has expired.    Requested Prescriptions  Pending Prescriptions Disp Refills   lisinopril-hydrochlorothiazide (ZESTORETIC) 20-12.5 MG tablet [Pharmacy Med Name: LISINOPRIL-HCTZ 20-12.5 MG TAB] 90 tablet 3    Sig: TAKE 1 TABLET BY MOUTH EVERY DAY     Cardiovascular:  ACEI + Diuretic Combos Failed - 11/25/2021  6:45 PM      Failed - Na in normal range and within 180 days    Sodium  Date Value Ref Range Status  11/25/2020 137 134 - 144 mmol/L Final         Failed - K in normal range and within 180 days    Potassium  Date Value Ref Range Status  11/25/2020 4.3 3.5 - 5.2 mmol/L Final         Failed - Cr in normal range and within 180 days    Creatinine, Ser  Date Value Ref Range Status  11/25/2020 0.91 0.57 - 1.00 mg/dL Final         Failed - eGFR is 30 or above and within 180 days    GFR calc Af Amer  Date Value Ref Range Status  11/20/2019 96 >59 mL/min/1.73 Final    Comment:    **[facility] currently reports eGFR in compliance with the current**   recommendations of the [employer]. [facility] will   update reporting as new guidelines are published from the NKF-ASN   Task force.    GFR calc non Af Amer  Date Value Ref Range Status  11/20/2019 83 >59 mL/min/1.73 Final   eGFR  Date Value Ref Range Status  11/25/2020 73 >59 mL/min/1.73 Final         Passed - Patient is not pregnant      Passed - Last BP in normal range    BP Readings from Last 1 Encounters:  06/30/21 134/70         Passed - Valid encounter within last 6 months    Recent Outpatient Visits           5 months ago Paronychia of great toe of left foot   [facility] Berglund, Laura H, MD   5 months ago Essential (primary) hypertension   [facility] Berglund, Laura  H, MD   8 months ago Asthma in adult, mild intermittent, with acute exacerbation   [facility] Berglund, Laura H, MD   1 year ago Annual physical exam   [facility] Berglund, Laura H, MD   1 year ago Acute non-recurrent maxillary sinusitis   [facility] Berglund, Laura H, MD       Future Appointments             In 5 days Berglund, Laura H, MD [facility]             

## 2021-11-26 NOTE — Telephone Encounter (Signed)
Pt aware request was sent to PCP, prescription had expired. Pt concerned as she states only has 2 pills left.

## 2021-11-27 ENCOUNTER — Other Ambulatory Visit: Payer: Self-pay

## 2021-11-27 DIAGNOSIS — I10 Essential (primary) hypertension: Secondary | ICD-10-CM

## 2021-11-27 MED ORDER — LISINOPRIL-HYDROCHLOROTHIAZIDE 20-12.5 MG PO TABS: 1.0000 | ORAL_TABLET | Freq: Every day | ORAL | 0 refills | Status: DC

## 2021-11-27 NOTE — Telephone Encounter (Signed)
Refill sent in.  KP 

## 2021-12-01 ENCOUNTER — Ambulatory Visit (INDEPENDENT_AMBULATORY_CARE_PROVIDER_SITE_OTHER): Payer: BC Managed Care – PPO | Admitting: Internal Medicine

## 2021-12-01 ENCOUNTER — Encounter: Payer: Self-pay | Admitting: Internal Medicine

## 2021-12-01 VITALS — BP 110/68 | HR 66 | Ht 66.0 in | Wt 199.0 lb

## 2021-12-01 DIAGNOSIS — N951 Menopausal and female climacteric states: Secondary | ICD-10-CM

## 2021-12-01 DIAGNOSIS — G43009 Migraine without aura, not intractable, without status migrainosus: Secondary | ICD-10-CM | POA: Diagnosis not present

## 2021-12-01 DIAGNOSIS — I10 Essential (primary) hypertension: Secondary | ICD-10-CM | POA: Diagnosis not present

## 2021-12-01 DIAGNOSIS — Z8711 Personal history of peptic ulcer disease: Secondary | ICD-10-CM

## 2021-12-01 DIAGNOSIS — E785 Hyperlipidemia, unspecified: Secondary | ICD-10-CM

## 2021-12-01 DIAGNOSIS — Z1231 Encounter for screening mammogram for malignant neoplasm of breast: Secondary | ICD-10-CM | POA: Diagnosis not present

## 2021-12-01 DIAGNOSIS — J4520 Mild intermittent asthma, uncomplicated: Secondary | ICD-10-CM

## 2021-12-01 DIAGNOSIS — Z0000 Encounter for general adult medical examination without abnormal findings: Secondary | ICD-10-CM

## 2021-12-01 DIAGNOSIS — N941 Unspecified dyspareunia: Secondary | ICD-10-CM

## 2021-12-01 MED ORDER — LANSOPRAZOLE 30 MG PO CPDR: 30.0000 mg | DELAYED_RELEASE_CAPSULE | Freq: Every day | ORAL | 3 refills | Status: DC

## 2021-12-01 MED ORDER — SUMATRIPTAN SUCCINATE 100 MG PO TABS: 100.0000 mg | ORAL_TABLET | Freq: Every day | ORAL | 5 refills | Status: DC | PRN

## 2021-12-01 NOTE — Progress Notes (Signed)
Date:  12/01/2021   Name:  Alicia Nichols   DOB:  05/25/1961   MRN:  [id number]   Chief Complaint: Annual Exam Alicia Nichols is a 61 y.o. female who presents today for her Complete Annual Exam. She feels fairly well. She reports exercising- taking care of grand kids. She reports she is sleeping poorly. Breast complaints - none.  Mammogram: 11/2020 [facility] - scheduled DEXA: none Pap smear: 06/2021 neg/neg with GYN despite TAH Colonoscopy: 12/2016 repeat 10 yrs  Health Maintenance Due  Topic Date Due   Zoster Vaccines- Shingrix (1 of 2) Never done   COVID-19 Vaccine (3 - Pfizer series) 01/31/2020    Immunization History  Administered Date(s) Administered   Influenza,inj,Quad PF,6+ Mos 05/09/2019, 06/12/2021   PFIZER(Purple Top)SARS-COV-2 Vaccination 11/13/2019, 12/06/2019   Tdap 04/24/2013    Hypertension This is a chronic problem. The problem is controlled. Associated symptoms include headaches. Pertinent negatives include no chest pain, palpitations or shortness of breath. Past treatments include ACE inhibitors and diuretics.  Migraine  This is a recurrent problem. The problem has been unchanged. Pertinent negatives include no abdominal pain, coughing, dizziness, fever, hearing loss, tinnitus or vomiting. She has tried triptans for the symptoms. Her past medical history is significant for hypertension.  Fibromyalgia - still having significant pain in all her joints.  Not sleeping well due to family issues - daughter and 3 small children living with her.  Taking oxycodone several times per day from [facility]. Dyspareunia - seen by GYN, atrophy noted.  Started on estrogen cream and Climara patches.  She has not noticed much improvement.  Now also having post coital headaches which are severe.  Lab Results  Component Value Date   NA 137 11/25/2020   K 4.3 11/25/2020   CO2 26 11/25/2020   GLUCOSE 89 11/25/2020   BUN 18 11/25/2020   CREATININE 0.91 11/25/2020   CALCIUM 9.5  11/25/2020   EGFR 73 11/25/2020   GFRNONAA 83 11/20/2019   Lab Results  Component Value Date   CHOL 270 (H) 11/25/2020   HDL 58 11/25/2020   LDLCALC 180 (H) 11/25/2020   TRIG 175 (H) 11/25/2020   CHOLHDL 4.7 (H) 11/25/2020   Lab Results  Component Value Date   TSH 1.420 11/25/2020   No results found for: "HGBA1C" Lab Results  Component Value Date   WBC 3.6 11/25/2020   HGB 11.9 11/25/2020   HCT 36.2 11/25/2020   MCV 87 11/25/2020   PLT 304 11/25/2020   Lab Results  Component Value Date   ALT 9 11/25/2020   AST 15 11/25/2020   ALKPHOS 91 11/25/2020   BILITOT 0.3 11/25/2020   No results found for: "25OHVITD2", "25OHVITD3", "VD25OH"   Review of Systems  Constitutional:  Negative for chills, fatigue and fever.  HENT:  Negative for congestion, hearing loss, tinnitus, trouble swallowing and voice change.   Eyes:  Negative for visual disturbance.  Respiratory:  Negative for cough, chest tightness, shortness of breath and wheezing.   Cardiovascular:  Negative for chest pain, palpitations and leg swelling.  Gastrointestinal:  Negative for abdominal pain, constipation, diarrhea and vomiting.  Endocrine: Negative for polydipsia and polyuria.  Genitourinary:  Positive for dyspareunia. Negative for dysuria, frequency, genital sores, vaginal bleeding and vaginal discharge.  Musculoskeletal:  Positive for arthralgias and myalgias. Negative for gait problem and joint swelling.  Skin:  Negative for color change and rash.       Lip lesion  Neurological:  Positive for  headaches. Negative for dizziness, tremors and light-headedness.  Hematological:  Negative for adenopathy. Does not bruise/bleed easily.  Psychiatric/Behavioral:  Positive for dysphoric mood and sleep disturbance. The patient is not nervous/anxious.     Patient Active Problem List   Diagnosis Date Noted   History of cervical dysplasia 06/30/2021   Dyspareunia in female 06/30/2021   Chronic idiopathic constipation  11/20/2019   Epigastric pain 05/16/2018   Drug-induced constipation 11/10/2017   Polyp of sigmoid colon    Hyperlipidemia, mild 11/10/2016   ACL (anterior cruciate ligament) tear 10/21/2015   Rosacea 10/21/2015   Fibrocystic breast changes 06/06/2015   Essential (primary) hypertension 12/03/2014   Urge incontinence 12/03/2014   H/O peptic ulcer 12/03/2014   Fibromyalgia 12/03/2014   Migraine without aura and without status migrainosus, not intractable 12/03/2014   Clinical depression 12/03/2014   Mild intermittent asthma without complication 12/03/2014   Hot flash, menopausal 12/03/2014   Idiopathic insomnia 12/03/2014   Family history of breast cancer 12/03/2014    Allergies  Allergen Reactions   Aspirin Shortness Of Breath   Hydrocodone Shortness Of Breath   Ambien [Zolpidem]     Suicidal, also caused vision issues   Bupropion Hcl     intolerance   Divalproex Sodium Er Nausea And Vomiting   Duloxetine Hcl     intolerance   Escitalopram     intolerance   Macrobid [Nitrofurantoin]     Severe abdominal pain   Morphine Sulfate Itching   Naproxen     delusions   Ampicillin Rash   Codeine Itching and Rash        Latex Rash    Gloves - when worn for extended period   Oxybutynin Other (See Comments)    Headache   Sulfa Antibiotics Rash    Past Surgical History:  Procedure Laterality Date   arm surgery Left 2018   reduction osteotomy   BACK SURGERY     CATARACT EXTRACTION W/PHACO Right 04/16/2019   Procedure: CATARACT EXTRACTION PHACO AND INTRAOCULAR LENS PLACEMENT (IOC) RIGHT SYMFONY LENS 00:31.0  9.5%  3.03;  Surgeon: Nevada Crane, MD;  Location: [facility];  Service: Ophthalmology;  Laterality: Right;  Latex   CATARACT EXTRACTION W/PHACO Left 05/14/2019   Procedure: CATARACT EXTRACTION PHACO AND INTRAOCULAR LENS PLACEMENT (IOC) LEFT SYMFONY LENS, 0.25, 00:04.9;  Surgeon: Nevada Crane, MD;  Location: [facility];  Service: Ophthalmology;   Laterality: Left;   COLONOSCOPY WITH PROPOFOL N/A 12/16/2016   Procedure: COLONOSCOPY WITH PROPOFOL;  Surgeon: Midge Minium, MD;  Location: [facility];  Service: Endoscopy;  Laterality: N/A;   HAND SURGERY Left 2008   trauma surgery after dog bite   KNEE ARTHROSCOPY     LAMINECTOMY  1992   MENISCUS REPAIR Right 01/2014   POLYPECTOMY  12/16/2016   Procedure: POLYPECTOMY INTESTINAL;  Surgeon: Midge Minium, MD;  Location: [facility];  Service: Endoscopy;;   TOTAL ABDOMINAL HYSTERECTOMY  2011    Social History   Tobacco Use   Smoking status: Former    Years: 10.00    Types: Cigarettes    Quit date: 1988    Years since quitting: 35.5   Smokeless tobacco: Never  Vaping Use   Vaping Use: Never used  Substance Use Topics   Alcohol use: Yes    Alcohol/week: 0.0 standard drinks of alcohol    Comment: may have a drink several times per year   Drug use: No     Medication list has been reviewed  and updated.  Current Meds  Medication Sig   albuterol (PROVENTIL) (2.5 MG/3ML) 0.083% nebulizer solution Take 3 mLs (2.5 mg total) by nebulization every 6 (six) hours as needed for wheezing or shortness of breath.   albuterol (VENTOLIN HFA) 108 (90 Base) MCG/ACT inhaler Inhale 2 puffs into the lungs every 6 (six) hours as needed for wheezing or shortness of breath.   Clocortolone Pivalate (CLODERM) 0.1 % cream Apply 1 application topically 2 (two) times daily.   conjugated estrogens (PREMARIN) vaginal cream Insert 1 g vaginally nightly for 7 nights then 1 g once weekly as maintenance   diclofenac (VOLTAREN) 75 MG EC tablet Take 75 mg by mouth 2 (two) times daily as needed.   diclofenac Sodium (VOLTAREN) 1 % GEL Apply topically as needed.   diphenhydrAMINE (BENADRYL) 50 MG capsule Take 50 mg by mouth every 6 (six) hours as needed.   Doxepin HCl 5 % CREA Apply topically.   estradiol (CLIMARA - DOSED IN MG/24 HR) 0.05 mg/24hr patch Place 1 patch (0.05 mg total) onto the skin once a  week.   gentamicin cream (GARAMYCIN) 0.1 % Apply 1 application topically 2 (two) times daily.   lansoprazole (PREVACID) 30 MG capsule TAKE 1 CAPSULE (30 MG TOTAL) BY MOUTH DAILY AT 12 NOON.   lisinopril-hydrochlorothiazide (ZESTORETIC) 20-12.5 MG tablet Take 1 tablet by mouth daily.   metaxalone (SKELAXIN) 800 MG tablet Take 800 mg by mouth 3 (three) times daily.   Probiotic Product (PROBIOTIC DAILY PO) Take by mouth daily.   Pseudoephedrine-APAP-DM (DAYQUIL PO) Take by mouth as needed.   SUMAtriptan (IMITREX) 100 MG tablet Take 1 tablet (100 mg total) by mouth daily as needed.       06/26/2021    9:46 AM 06/12/2021    2:55 PM 03/30/2021    3:58 PM 11/25/2020   10:46 AM  GAD 7 : Generalized Anxiety Score  Nervous, Anxious, on Edge 1 3 0 1  Control/stop worrying 0 3 0 0  Worry too much - different things 0 3 0 0  Trouble relaxing 0 2 0 1  Restless 0 2 0 0  Easily annoyed or irritable 0 2 0 0  Afraid - awful might happen 0 0 0 0  Total GAD 7 Score 1 15 0 2  Anxiety Difficulty Not difficult at all Somewhat difficult Not difficult at all        06/26/2021    9:46 AM  Depression screen PHQ 2/9  Decreased Interest 0  Down, Depressed, Hopeless 0  PHQ - 2 Score 0  Altered sleeping 2  Tired, decreased energy 3  Change in appetite 0  Feeling bad or failure about yourself  0  Trouble concentrating 0  Moving slowly or fidgety/restless 0  Suicidal thoughts 0  PHQ-9 Score 5  Difficult doing work/chores Somewhat difficult    BP Readings from Last 3 Encounters:  12/01/21 110/68  06/30/21 134/70  06/26/21 120/72    Physical Exam Vitals and nursing note reviewed.  Constitutional:      General: She is not in acute distress.    Appearance: She is well-developed.  HENT:     Head: Normocephalic and atraumatic.     Right Ear: Tympanic membrane and ear canal normal.     Left Ear: Tympanic membrane and ear canal normal.     Nose:     Right Sinus: No maxillary sinus tenderness.      Left Sinus: No maxillary sinus tenderness.  Eyes:  General: No scleral icterus.       Right eye: No discharge.        Left eye: No discharge.     Conjunctiva/sclera: Conjunctivae normal.  Neck:     Thyroid: No thyromegaly.     Vascular: No carotid bruit.  Cardiovascular:     Rate and Rhythm: Normal rate and regular rhythm.     Pulses: Normal pulses.     Heart sounds: Normal heart sounds.  Pulmonary:     Effort: Pulmonary effort is normal. No respiratory distress.     Breath sounds: No wheezing.  Chest:  Breasts:    Right: No mass, nipple discharge, skin change or tenderness.     Left: No mass, nipple discharge, skin change or tenderness.  Abdominal:     General: Bowel sounds are normal.     Palpations: Abdomen is soft.     Tenderness: There is no abdominal tenderness.  Musculoskeletal:     Right hand: Bony tenderness present. Decreased strength.     Left hand: Bony tenderness present. Decreased strength.     Cervical back: Normal range of motion. No erythema.     Right lower leg: No edema.     Left lower leg: No edema.  Lymphadenopathy:     Cervical: No cervical adenopathy.  Skin:    General: Skin is warm and dry.     Findings: No rash.  Neurological:     Mental Status: She is alert and oriented to person, place, and time.     Cranial Nerves: No cranial nerve deficit.     Sensory: No sensory deficit.     Deep Tendon Reflexes: Reflexes are normal and symmetric.  Psychiatric:        Attention and Perception: Attention normal.        Mood and Affect: Mood normal.     Wt Readings from Last 3 Encounters:  12/01/21 199 lb (90.3 kg)  06/30/21 209 lb (94.8 kg)  06/26/21 206 lb 9.6 oz (93.7 kg)    BP 110/68   Pulse 66   Ht 5\' 6"  (1.676 m)   Wt 199 lb (90.3 kg)   SpO2 98%   BMI 32.12 kg/m   Assessment and Plan: 1. Annual physical exam Exam is normal except for weight. Encourage regular exercise and appropriate dietary changes. She declined Shingrix due to hx of  shingles x 3 - Hemoglobin A1c  2. Encounter for screening mammogram for breast cancer Scheduled at [facility]  3. Essential (primary) hypertension Clinically stable exam with well controlled BP. Tolerating medications without side effects at this time. Pt to continue current regimen and low sodium diet; benefits of regular exercise as able discussed. - CBC with Differential/Platelet - Comprehensive metabolic panel - TSH - Urinalysis, Routine w reflex microscopic  4. Migraine without aura and without status migrainosus, not intractable Clinically stable without change in character or frequency of migraine headaches Headaches respond well to current therapy with imitrex. Will continue current plan, follow up if worsening. Post coital headaches are new.  Could try nsaid prior to intercourse. - SUMAtriptan (IMITREX) 100 MG tablet; Take 1 tablet (100 mg total) by mouth daily as needed.  Dispense: 9 tablet; Refill: 5  5. Mild intermittent asthma without complication Stable on no daily inhalers  6. Hyperlipidemia, mild Check labs and advise - Lipid panel  7. Vasomotor symptoms due to menopause Now on Climara patches from GYN  8. Dyspareunia in female Continue topical estrogen. 9. H/O peptic ulcer Symptoms well  controlled on daily PPI No red flag signs such as weight loss, n/v, melena Will continue Prevacid. - lansoprazole (PREVACID) 30 MG capsule; Take 1 capsule (30 mg total) by mouth daily at 12 noon.  Dispense: 90 capsule; Refill: 3   Partially dictated using Animal nutritionist. Any errors are unintentional.  Bari Edward, MD [facility]  12/01/2021

## 2021-12-02 LAB — COMPREHENSIVE METABOLIC PANEL
ALT: 15 IU/L (ref 0–32)
AST: 18 IU/L (ref 0–40)
Albumin/Globulin Ratio: 1.5 (ref 1.2–2.2)
Albumin: 4.3 g/dL (ref 3.8–4.9)
Alkaline Phosphatase: 83 IU/L (ref 44–121)
BUN/Creatinine Ratio: 20 (ref 9–23)
BUN: 19 mg/dL (ref 6–24)
Bilirubin Total: 0.3 mg/dL (ref 0.0–1.2)
CO2: 24 mmol/L (ref 20–29)
Calcium: 9.2 mg/dL (ref 8.7–10.2)
Chloride: 100 mmol/L (ref 96–106)
Creatinine, Ser: 0.94 mg/dL (ref 0.57–1.00)
Globulin, Total: 2.8 g/dL (ref 1.5–4.5)
Glucose: 89 mg/dL (ref 70–99)
Potassium: 3.9 mmol/L (ref 3.5–5.2)
Sodium: 138 mmol/L (ref 134–144)
Total Protein: 7.1 g/dL (ref 6.0–8.5)
eGFR: 70 mL/min/{1.73_m2} (ref >59)

## 2021-12-02 LAB — CBC WITH DIFFERENTIAL/PLATELET
Basophils Absolute: 0.1 10*3/uL (ref 0.0–0.2)
Basos: 2 %
EOS (ABSOLUTE): 0.2 10*3/uL (ref 0.0–0.4)
Eos: 5 %
Hematocrit: 36 % (ref 34.0–46.6)
Hemoglobin: 11.6 g/dL (ref 11.1–15.9)
Immature Grans (Abs): 0 10*3/uL (ref 0.0–0.1)
Immature Granulocytes: 0 %
Lymphocytes Absolute: 1.3 10*3/uL (ref 0.7–3.1)
Lymphs: 41 %
MCH: 28.2 pg (ref 26.6–33.0)
MCHC: 32.2 g/dL (ref 31.5–35.7)
MCV: 88 fL (ref 79–97)
Monocytes Absolute: 0.4 10*3/uL (ref 0.1–0.9)
Monocytes: 11 %
Neutrophils Absolute: 1.3 10*3/uL — ABNORMAL LOW (ref 1.4–7.0)
Neutrophils: 41 %
Platelets: 297 10*3/uL (ref 150–450)
RBC: 4.11 x10E6/uL (ref 3.77–5.28)
RDW: 12.7 % (ref 11.7–15.4)
WBC: 3.2 10*3/uL — ABNORMAL LOW (ref 3.4–10.8)

## 2021-12-02 LAB — LIPID PANEL
Chol/HDL Ratio: 4.4 ratio (ref 0.0–4.4)
Cholesterol, Total: 257 mg/dL — ABNORMAL HIGH (ref 100–199)
HDL: 59 mg/dL (ref >39)
LDL Chol Calc (NIH): 171 mg/dL — ABNORMAL HIGH (ref 0–99)
Triglycerides: 149 mg/dL (ref 0–149)
VLDL Cholesterol Cal: 27 mg/dL (ref 5–40)

## 2021-12-02 LAB — HEMOGLOBIN A1C
Est. average glucose Bld gHb Est-mCnc: 103 mg/dL
Hgb A1c MFr Bld: 5.2 % (ref 4.8–5.6)

## 2021-12-02 LAB — TSH: TSH: 4.05 u[IU]/mL (ref 0.450–4.500)

## 2021-12-02 LAB — URINALYSIS, ROUTINE W REFLEX MICROSCOPIC
Bilirubin, UA: NEGATIVE
Glucose, UA: NEGATIVE
Ketones, UA: NEGATIVE
Leukocytes,UA: NEGATIVE
Nitrite, UA: NEGATIVE
Protein,UA: NEGATIVE
RBC, UA: NEGATIVE
Specific Gravity, UA: 1.027 (ref 1.005–1.030)
Urobilinogen, Ur: 0.2 mg/dL (ref 0.2–1.0)
pH, UA: 5.5 (ref 5.0–7.5)

## 2022-01-22 ENCOUNTER — Encounter: Payer: Self-pay | Admitting: Internal Medicine

## 2022-01-22 ENCOUNTER — Ambulatory Visit: Payer: BC Managed Care – PPO | Admitting: Internal Medicine

## 2022-01-22 ENCOUNTER — Ambulatory Visit (INDEPENDENT_AMBULATORY_CARE_PROVIDER_SITE_OTHER): Payer: BC Managed Care – PPO | Admitting: Internal Medicine

## 2022-01-22 VITALS — BP 128/84 | HR 78 | Ht 66.0 in | Wt 200.6 lb

## 2022-01-22 DIAGNOSIS — F411 Generalized anxiety disorder: Secondary | ICD-10-CM | POA: Insufficient documentation

## 2022-01-22 DIAGNOSIS — J4521 Mild intermittent asthma with (acute) exacerbation: Secondary | ICD-10-CM

## 2022-01-22 MED ORDER — AZITHROMYCIN 250 MG PO TABS: ORAL_TABLET | ORAL | 0 refills | Status: AC

## 2022-01-22 MED ORDER — PREDNISONE 10 MG PO TABS: 10.0000 mg | ORAL_TABLET | ORAL | 0 refills | Status: AC

## 2022-01-22 MED ORDER — ALPRAZOLAM 0.25 MG PO TABS: 0.2500 mg | ORAL_TABLET | Freq: Every day | ORAL | 0 refills | Status: DC | PRN

## 2022-01-22 MED ORDER — ALBUTEROL SULFATE (2.5 MG/3ML) 0.083% IN NEBU: 2.5000 mg | INHALATION_SOLUTION | Freq: Four times a day (QID) | RESPIRATORY_TRACT | 1 refills | Status: DC | PRN

## 2022-01-22 NOTE — Progress Notes (Signed)
Date:  01/22/2022   Name:  Alicia Nichols   DOB:  1960-11-30   MRN:  [id number]   Chief Complaint: Cough  Cough This is a new problem. Episode onset: 2 weeks. The cough is Non-productive. Associated symptoms include chest pain (rib pain from coughing), shortness of breath and wheezing. Pertinent negatives include no chills, ear pain, fever, nasal congestion, postnasal drip or rhinorrhea. The symptoms are aggravated by pollens, exercise and cold air. She has tried a beta-agonist inhaler for the symptoms. Her past medical history is significant for asthma.  Anxiety Presents for follow-up visit. Symptoms include chest pain (rib pain from coughing), excessive worry, nervous/anxious behavior and shortness of breath. Patient reports no dizziness, palpitations or suicidal ideas. Symptoms occur occasionally. The severity of symptoms is causing significant distress (worse recently - son in law died suddenly). The quality of sleep is fair.   Her past medical history is significant for asthma.    Lab Results  Component Value Date   NA 138 12/01/2021   K 3.9 12/01/2021   CO2 24 12/01/2021   GLUCOSE 89 12/01/2021   BUN 19 12/01/2021   CREATININE 0.94 12/01/2021   CALCIUM 9.2 12/01/2021   EGFR 70 12/01/2021   GFRNONAA 83 11/20/2019   Lab Results  Component Value Date   CHOL 257 (H) 12/01/2021   HDL 59 12/01/2021   LDLCALC 171 (H) 12/01/2021   TRIG 149 12/01/2021   CHOLHDL 4.4 12/01/2021   Lab Results  Component Value Date   TSH 4.050 12/01/2021   Lab Results  Component Value Date   HGBA1C 5.2 12/01/2021   Lab Results  Component Value Date   WBC 3.2 (L) 12/01/2021   HGB 11.6 12/01/2021   HCT 36.0 12/01/2021   MCV 88 12/01/2021   PLT 297 12/01/2021   Lab Results  Component Value Date   ALT 15 12/01/2021   AST 18 12/01/2021   ALKPHOS 83 12/01/2021   BILITOT 0.3 12/01/2021   No results found for: "25OHVITD2", "25OHVITD3", "VD25OH"   Review of Systems  Constitutional:   Negative for chills, diaphoresis and fever.  HENT:  Negative for ear pain, postnasal drip and rhinorrhea.   Respiratory:  Positive for cough, shortness of breath and wheezing.   Cardiovascular:  Positive for chest pain (rib pain from coughing). Negative for palpitations.  Neurological:  Negative for dizziness, facial asymmetry and light-headedness.  Psychiatric/Behavioral:  Positive for dysphoric mood and sleep disturbance. Negative for suicidal ideas. The patient is nervous/anxious.     Patient Active Problem List   Diagnosis Date Noted   History of cervical dysplasia 06/30/2021   Dyspareunia in female 06/30/2021   Chronic idiopathic constipation 11/20/2019   Epigastric pain 05/16/2018   Drug-induced constipation 11/10/2017   Polyp of sigmoid colon    Hyperlipidemia, mild 11/10/2016   ACL (anterior cruciate ligament) tear 10/21/2015   Rosacea 10/21/2015   Fibrocystic breast changes 06/06/2015   Essential (primary) hypertension 12/03/2014   Urge incontinence 12/03/2014   H/O peptic ulcer 12/03/2014   Fibromyalgia 12/03/2014   Migraine without aura and without status migrainosus, not intractable 12/03/2014   Clinical depression 12/03/2014   Mild intermittent asthma without complication 46/50/3546   Hot flash, menopausal 12/03/2014   Idiopathic insomnia 12/03/2014   Family history of breast cancer 12/03/2014    Allergies  Allergen Reactions   Aspirin Shortness Of Breath   Hydrocodone Shortness Of Breath   Ambien [Zolpidem]     Suicidal, also caused vision issues  Bupropion Hcl     intolerance   Divalproex Sodium Er Nausea And Vomiting   Duloxetine Hcl     intolerance   Escitalopram     intolerance   Macrobid [Nitrofurantoin]     Severe abdominal pain   Morphine Sulfate Itching   Naproxen     delusions   Ampicillin Rash   Codeine Itching and Rash        Latex Rash    Gloves - when worn for extended period   Oxybutynin Other (See Comments)    Headache   Sulfa  Antibiotics Rash    Past Surgical History:  Procedure Laterality Date   arm surgery Left 2018   reduction osteotomy   BACK SURGERY     CATARACT EXTRACTION W/PHACO Right 04/16/2019   Procedure: CATARACT EXTRACTION PHACO AND INTRAOCULAR LENS PLACEMENT ([place]) RIGHT SYMFONY LENS 00:31.0  9.5%  3.03;  Surgeon: Eulogio Bear, MD;  Location: [place];  Service: Ophthalmology;  Laterality: Right;  Latex   CATARACT EXTRACTION W/PHACO Left 05/14/2019   Procedure: CATARACT EXTRACTION PHACO AND INTRAOCULAR LENS PLACEMENT ([facility]) LEFT SYMFONY LENS, 0.25, 00:04.9;  Surgeon: Eulogio Bear, MD;  Location: [place];  Service: Ophthalmology;  Laterality: Left;   COLONOSCOPY WITH PROPOFOL N/A 12/16/2016   Procedure: COLONOSCOPY WITH PROPOFOL;  Surgeon: Lucilla Lame, MD;  Location: [place];  Service: Endoscopy;  Laterality: N/A;   HAND SURGERY Left 2008   trauma surgery after dog bite   KNEE ARTHROSCOPY     LAMINECTOMY  1992   MENISCUS REPAIR Right 01/2014   POLYPECTOMY  12/16/2016   Procedure: POLYPECTOMY INTESTINAL;  Surgeon: Lucilla Lame, MD;  Location: [place];  Service: Endoscopy;;   TOTAL ABDOMINAL HYSTERECTOMY  2011    Social History   Tobacco Use   Smoking status: Former    Years: 10.00    Types: Cigarettes    Quit date: 1988    Years since quitting: 35.6   Smokeless tobacco: Never  Vaping Use   Vaping Use: Never used  Substance Use Topics   Alcohol use: Yes    Alcohol/week: 0.0 standard drinks of alcohol    Comment: may have a drink several times per year   Drug use: No     Medication list has been reviewed and updated.  Current Meds  Medication Sig   albuterol (VENTOLIN HFA) 108 (90 Base) MCG/ACT inhaler Inhale 2 puffs into the lungs every 6 (six) hours as needed for wheezing or shortness of breath.   azithromycin (ZITHROMAX Z-PAK) 250 MG tablet UAD   Clocortolone Pivalate (CLODERM) 0.1 % cream Apply 1 application topically 2  (two) times daily.   conjugated estrogens (PREMARIN) vaginal cream Insert 1 g vaginally nightly for 7 nights then 1 g once weekly as maintenance   diclofenac (VOLTAREN) 75 MG EC tablet Take 75 mg by mouth 2 (two) times daily as needed.   diclofenac Sodium (VOLTAREN) 1 % GEL Apply topically as needed.   diphenhydrAMINE (BENADRYL) 50 MG capsule Take 50 mg by mouth every 6 (six) hours as needed.   Doxepin HCl 5 % CREA Apply topically.   estradiol (CLIMARA - DOSED IN MG/24 HR) 0.05 mg/24hr patch Place 1 patch (0.05 mg total) onto the skin once a week.   gentamicin cream (GARAMYCIN) 0.1 % Apply 1 application topically 2 (two) times daily.   lansoprazole (PREVACID) 30 MG capsule Take 1 capsule (30 mg total) by mouth daily at 12 noon.   lisinopril-hydrochlorothiazide (ZESTORETIC)  20-12.5 MG tablet Take 1 tablet by mouth daily.   metaxalone (SKELAXIN) 800 MG tablet Take 800 mg by mouth 3 (three) times daily.   oxyCODONE-acetaminophen (PERCOCET) 10-325 MG tablet Take 1-2 tablets by mouth every 4 (four) hours.   predniSONE (DELTASONE) 10 MG tablet Take 1 tablet (10 mg total) by mouth as directed for 6 days. Take 6,5,4,3,2,1 then stop   Probiotic Product (PROBIOTIC DAILY PO) Take by mouth daily.   Pseudoephedrine-APAP-DM (DAYQUIL PO) Take by mouth as needed.   SUMAtriptan (IMITREX) 100 MG tablet Take 1 tablet (100 mg total) by mouth daily as needed.   [DISCONTINUED] albuterol (PROVENTIL) (2.5 MG/3ML) 0.083% nebulizer solution Take 3 mLs (2.5 mg total) by nebulization every 6 (six) hours as needed for wheezing or shortness of breath.       01/22/2022   11:11 AM 12/01/2021   10:33 AM 06/26/2021    9:46 AM 06/12/2021    2:55 PM  GAD 7 : Generalized Anxiety Score  Nervous, Anxious, on Edge '1 3 1 3  ' Control/stop worrying 1 1 0 3  Worry too much - different things 2 1 0 3  Trouble relaxing 1 3 0 2  Restless 1 1 0 2  Easily annoyed or irritable 0 3 0 2  Afraid - awful might happen 0 0 0 0  Total GAD 7  Score '6 12 1 15  ' Anxiety Difficulty Not difficult at all Somewhat difficult Not difficult at all Somewhat difficult       01/22/2022   11:11 AM 12/01/2021   10:32 AM 06/26/2021    9:46 AM  Depression screen PHQ 2/9  Decreased Interest 1 2 0  Down, Depressed, Hopeless 1 1 0  PHQ - 2 Score 2 3 0  Altered sleeping 0 3 2  Tired, decreased energy '3 3 3  ' Change in appetite 0 1 0  Feeling bad or failure about yourself  0 1 0  Trouble concentrating 1 3 0  Moving slowly or fidgety/restless 0 0 0  Suicidal thoughts 0 0 0  PHQ-9 Score '6 14 5  ' Difficult doing work/chores Not difficult at all Somewhat difficult Somewhat difficult    BP Readings from Last 3 Encounters:  01/22/22 128/84  12/01/21 110/68  06/30/21 134/70    Physical Exam Vitals and nursing note reviewed.  Constitutional:      General: She is in acute distress (with cough).     Appearance: She is well-developed.  HENT:     Head: Normocephalic and atraumatic.  Cardiovascular:     Rate and Rhythm: Normal rate and regular rhythm.     Pulses: Normal pulses.  Pulmonary:     Effort: Pulmonary effort is normal. No respiratory distress.     Breath sounds: Transmitted upper airway sounds present. Examination of the right-upper field reveals wheezing. Examination of the left-upper field reveals wheezing. Wheezing present. No rhonchi.  Musculoskeletal:     Cervical back: Normal range of motion.  Lymphadenopathy:     Cervical: No cervical adenopathy.  Skin:    General: Skin is warm and dry.     Findings: No rash.  Neurological:     Mental Status: She is alert and oriented to person, place, and time.  Psychiatric:        Attention and Perception: Attention normal.        Mood and Affect: Mood normal. Affect is tearful.        Speech: Speech normal.        Behavior:  Behavior normal.     Wt Readings from Last 3 Encounters:  01/22/22 200 lb 9.6 oz (91 kg)  12/01/21 199 lb (90.3 kg)  06/30/21 209 lb (94.8 kg)    BP  128/84 (BP Location: Right Arm, Cuff Size: Large)   Pulse 78   Ht '5\' 6"'  (1.676 m)   Wt 200 lb 9.6 oz (91 kg)   SpO2 98%   BMI 32.38 kg/m   Assessment and Plan: 1. Asthma in adult, mild intermittent, with acute exacerbation Resume Albuterol nebs tid and use MDI PRN Follow up if no improvement - predniSONE (DELTASONE) 10 MG tablet; Take 1 tablet (10 mg total) by mouth as directed for 6 days. Take 6,5,4,3,2,1 then stop  Dispense: 21 tablet; Refill: 0 - azithromycin (ZITHROMAX Z-PAK) 250 MG tablet; UAD  Dispense: 6 each; Refill: 0 - albuterol (PROVENTIL) (2.5 MG/3ML) 0.083% nebulizer solution; Take 3 mLs (2.5 mg total) by nebulization every 6 (six) hours as needed for wheezing or shortness of breath.  Dispense: 150 mL; Refill: 1  2. Generalized anxiety disorder Did not get much benefit from Buspar so she stopped it. Currently dealing with the death of son in law and needs something to use PRN only - ALPRAZolam (XANAX) 0.25 MG tablet; Take 1 tablet (0.25 mg total) by mouth daily as needed for anxiety.  Dispense: 10 tablet; Refill: 0   Partially dictated using Editor, commissioning. Any errors are unintentional.  Halina Maidens, MD [facility]  01/22/2022

## 2022-02-24 ENCOUNTER — Other Ambulatory Visit: Payer: Self-pay | Admitting: Internal Medicine

## 2022-02-24 DIAGNOSIS — I10 Essential (primary) hypertension: Secondary | ICD-10-CM

## 2022-02-24 NOTE — Telephone Encounter (Signed)
Requested Prescriptions  Pending Prescriptions Disp Refills  . lisinopril-hydrochlorothiazide (ZESTORETIC) 20-12.5 MG tablet [Pharmacy Med Name: LISINOPRIL-HCTZ 20-12.5 MG TAB] 90 tablet 1    Sig: TAKE 1 TABLET BY MOUTH EVERY DAY     Cardiovascular:  ACEI + Diuretic Combos Passed - 02/24/2022  2:13 AM      Passed - Na in normal range and within 180 days    Sodium  Date Value Ref Range Status  12/01/2021 138 134 - 144 mmol/L Final         Passed - K in normal range and within 180 days    Potassium  Date Value Ref Range Status  12/01/2021 3.9 3.5 - 5.2 mmol/L Final         Passed - Cr in normal range and within 180 days    Creatinine, Ser  Date Value Ref Range Status  12/01/2021 0.94 0.57 - 1.00 mg/dL Final         Passed - eGFR is 30 or above and within 180 days    GFR calc Af Amer  Date Value Ref Range Status  11/20/2019 96 >59 mL/min/1.73 Final    Comment:    **[facility] currently reports eGFR in compliance with the current**   recommendations of the [employer]. [facility] will   update reporting as new guidelines are published from the NKF-ASN   Task force.    GFR calc non Af Amer  Date Value Ref Range Status  11/20/2019 83 >59 mL/min/1.73 Final   eGFR  Date Value Ref Range Status  12/01/2021 70 >59 mL/min/1.73 Final         Passed - Patient is not pregnant      Passed - Last BP in normal range    BP Readings from Last 1 Encounters:  01/22/22 128/84         Passed - Valid encounter within last 6 months    Recent Outpatient Visits          1 month ago Asthma in adult, mild intermittent, with acute exacerbation   [facility], Jesse Sans, MD   2 months ago Annual physical exam   [place] [facility] at [facility], Jesse Sans, MD   8 months ago Paronychia of great toe of left foot   [place] at [facility],  Jesse Sans, MD   8 months ago Essential (primary) hypertension   [place] [facility] at [facility], Jesse Sans, MD   11 months ago Asthma in adult, mild intermittent, with acute exacerbation   [place] [facility] at [facility], Jesse Sans, MD      Future Appointments            In 3 months [facility], Jesse Sans, MD [facility], [facility]   In 9 months Army Melia, Jesse Sans, MD [place] [facility] at [facility], [facility]

## 2022-04-06 ENCOUNTER — Other Ambulatory Visit: Payer: Self-pay | Admitting: Obstetrics and Gynecology

## 2022-04-06 ENCOUNTER — Telehealth: Payer: Self-pay

## 2022-04-06 DIAGNOSIS — N941 Unspecified dyspareunia: Secondary | ICD-10-CM

## 2022-04-06 DIAGNOSIS — N952 Postmenopausal atrophic vaginitis: Secondary | ICD-10-CM

## 2022-04-06 MED ORDER — PREMARIN 0.625 MG/GM VA CREA: TOPICAL_CREAM | VAGINAL | 0 refills | Status: DC

## 2022-04-06 NOTE — Telephone Encounter (Signed)
Rx RF eRxd for prem vag crm.

## 2022-04-06 NOTE — Telephone Encounter (Signed)
Pt is needing her RX refilled on premarin. Her insurance is running out today, so she is trying to get it refilled before that. I advised her I would send to [facility] to let me know if I can refill it. [facility] [place]. Please advise.

## 2022-04-06 NOTE — Telephone Encounter (Signed)
Pt notified Rx eRxd. Hope she can pick up before pharm closes at 9 PM.

## 2022-04-06 NOTE — Telephone Encounter (Signed)
Called pt, no answer, [facility]TRC. 

## 2022-04-06 NOTE — Progress Notes (Signed)
Rx RF till 1/24 annual

## 2022-04-06 NOTE — Telephone Encounter (Signed)
Rx RF sent for 1 yr 1/23. Is she trying trying to fill too early? She should have enough RF but if too early, I can't change that. Pls clarify.

## 2022-06-08 ENCOUNTER — Ambulatory Visit: Payer: BC Managed Care – PPO | Admitting: Internal Medicine

## 2022-08-18 ENCOUNTER — Other Ambulatory Visit: Payer: Self-pay | Admitting: Internal Medicine

## 2022-08-18 DIAGNOSIS — I10 Essential (primary) hypertension: Secondary | ICD-10-CM

## 2022-08-27 ENCOUNTER — Other Ambulatory Visit: Payer: Self-pay | Admitting: Obstetrics and Gynecology

## 2022-08-27 ENCOUNTER — Other Ambulatory Visit: Payer: Self-pay | Admitting: Internal Medicine

## 2022-08-27 DIAGNOSIS — I10 Essential (primary) hypertension: Secondary | ICD-10-CM

## 2022-08-27 DIAGNOSIS — N941 Unspecified dyspareunia: Secondary | ICD-10-CM

## 2022-08-27 DIAGNOSIS — N952 Postmenopausal atrophic vaginitis: Secondary | ICD-10-CM

## 2022-09-20 DIAGNOSIS — G894 Chronic pain syndrome: Secondary | ICD-10-CM | POA: Diagnosis not present

## 2022-09-20 DIAGNOSIS — M170 Bilateral primary osteoarthritis of knee: Secondary | ICD-10-CM | POA: Diagnosis not present

## 2022-09-30 ENCOUNTER — Ambulatory Visit: Payer: Self-pay

## 2022-09-30 ENCOUNTER — Ambulatory Visit (INDEPENDENT_AMBULATORY_CARE_PROVIDER_SITE_OTHER): Payer: Medicare Other | Admitting: Family Medicine

## 2022-09-30 ENCOUNTER — Encounter: Payer: Self-pay | Admitting: Family Medicine

## 2022-09-30 VITALS — BP 150/100 | HR 70 | Ht 66.0 in | Wt 208.0 lb

## 2022-09-30 DIAGNOSIS — R1033 Periumbilical pain: Secondary | ICD-10-CM

## 2022-09-30 NOTE — Telephone Encounter (Signed)
  Chief Complaint: Lump above navel - size of a tennis ball Symptoms: Pain - lump Frequency: 1 year  - comes and goes Pertinent Negatives: Patient denies fever Disposition: ED /[] Urgent Care (no appt availability in office) / Appointment(In office/virtual)/  [facility]/ Home Care/ Refused Recommended Disposition /[] [facility]/  Follow-up with PCP Additional Notes: PT states that this lump has come and gone over the past year. Lump is currently present and is uncomfortable. Pt also reports an incidence earlier this week of possible low blood sugar. Pt states that she just doesn't feel good.  Reason for Disposition  [1] Constant abdominal pain AND [2] present > 2 hours  (NO pain or tenderness of hernia)  Answer Assessment - Initial Assessment Questions 1. ONSET:  "When did this first appear?"     Come and goes - 1 year 2. APPEARANCE: "What does it look like?"     Lump 3. SIZE: "How big is it?" (inches, cm or compare to coins, fruit)     A tennis ball 4. LOCATION: "Where exactly is the hernia located?"     Above the navel 5. PATTERN: "Does the swelling come and go, or has it been constant since it started?"     Comes and  6. PAIN: "Is there any pain?" If Yes, ask: "How bad is it?"  (Scale 1-10; or mild, moderate, severe)    - MILD (1-3): Doesn't interfere with normal activities, abdomen soft and not tender to touch.     - MODERATE (4-7): Interferes with normal activities or awakens from sleep, abdomen tender to touch.     - SEVERE (8-10): Excruciating pain, doubled over, unable to do any normal activities.       Uncomfortable - 7-8/10 7. DIAGNOSIS: "Have you been seen by a doctor (or NP/PA) for this?" "Did the doctor diagnose you as having a hernia?"     no 8. OTHER SYMPTOMS: "Do you have any other symptoms?" (e.g., fever, abdomen pain, vomiting)     Something happened Tuesday - nearly passed out - possible low blood sugar.  No energy - does not feel  good.  Protocols used: [facility]

## 2022-09-30 NOTE — Progress Notes (Signed)
Date:  09/30/2022   Name:  Alicia Nichols   DOB:  [birth year]   MRN:  [id number]   Chief Complaint: Abdominal Pain (Burning pain, "lump in abdomen that has been there for 4 years but has gotten worse in the past 6 months." Lump comes and goes- lays down and it goes away )  Abdominal Pain This is a chronic problem. The current episode started more than 1 year ago. The problem occurs daily (daily for 6 months). The problem has been waxing and waning. The pain is located in the epigastric region. The pain is moderate. The quality of the pain is burning ("indigestion"). Pain radiation: upper  to usual. Associated symptoms include constipation and nausea. Pertinent negatives include no diarrhea, fever, frequency, hematochezia, hematuria, melena or vomiting. The pain is relieved by Eating. She has tried proton pump inhibitors (probiotic) for the symptoms. The treatment provided moderate relief.    Lab Results  Component Value Date   NA 138 12/01/2021   K 3.9 12/01/2021   CO2 24 12/01/2021   GLUCOSE 89 12/01/2021   BUN 19 12/01/2021   CREATININE 0.94 12/01/2021   CALCIUM 9.2 12/01/2021   EGFR 70 12/01/2021   GFRNONAA 83 11/20/2019   Lab Results  Component Value Date   CHOL 257 (H) 12/01/2021   HDL 59 12/01/2021   LDLCALC 171 (H) 12/01/2021   TRIG 149 12/01/2021   CHOLHDL 4.4 12/01/2021   Lab Results  Component Value Date   TSH 4.050 12/01/2021   Lab Results  Component Value Date   HGBA1C 5.2 12/01/2021   Lab Results  Component Value Date   WBC 3.2 (L) 12/01/2021   HGB 11.6 12/01/2021   HCT 36.0 12/01/2021   MCV 88 12/01/2021   PLT 297 12/01/2021   Lab Results  Component Value Date   ALT 15 12/01/2021   AST 18 12/01/2021   ALKPHOS 83 12/01/2021   BILITOT 0.3 12/01/2021   No results found for: "25OHVITD2", "25OHVITD3", "VD25OH"   Review of Systems  Constitutional:  Negative for chills and fever.  Gastrointestinal:  Positive for abdominal pain, constipation and  nausea. Negative for blood in stool, diarrhea, hematochezia, melena and vomiting.  Genitourinary:  Positive for urgency. Negative for frequency, hematuria, vaginal bleeding, vaginal discharge and vaginal pain.    Patient Active Problem List   Diagnosis Date Noted   Generalized anxiety disorder 01/22/2022   History of cervical dysplasia 06/30/2021   Dyspareunia in female 06/30/2021   Chronic idiopathic constipation 11/20/2019   Drug-induced constipation 11/10/2017   Polyp of sigmoid colon    Hyperlipidemia, mild 11/10/2016   ACL (anterior cruciate ligament) tear 10/21/2015   Rosacea 10/21/2015   Fibrocystic breast changes 06/06/2015   Essential (primary) hypertension 12/03/2014   Urge incontinence 12/03/2014   H/O peptic ulcer 12/03/2014   Fibromyalgia 12/03/2014   Migraine without aura and without status migrainosus, not intractable 12/03/2014   Mild intermittent asthma without complication 12/03/2014   Hot flash, menopausal 12/03/2014   Idiopathic insomnia 12/03/2014   Family history of breast cancer 12/03/2014    Allergies  Allergen Reactions   Aspirin Shortness Of Breath   Hydrocodone Shortness Of Breath   Ambien [Zolpidem]     Suicidal, also caused vision issues   Bupropion Hcl     intolerance   Divalproex Sodium Er Nausea And Vomiting   Duloxetine Hcl     intolerance   Escitalopram     intolerance   Macrobid [Nitrofurantoin]  Severe abdominal pain   Morphine Sulfate Itching   Naproxen     delusions   Ampicillin Rash   Codeine Itching and Rash        Latex Rash    Gloves - when worn for extended period   Oxybutynin Other (See Comments)    Headache   Sulfa Antibiotics Rash    Past Surgical History:  Procedure Laterality Date   arm surgery Left 2018   reduction osteotomy   BACK SURGERY     CATARACT EXTRACTION W/PHACO Right 04/16/2019   Procedure: CATARACT EXTRACTION PHACO AND INTRAOCULAR LENS PLACEMENT (IOC) RIGHT SYMFONY LENS 00:31.0  9.5%  3.03;   Surgeon: Nevada Crane, MD;  Location: [facility];  Service: Ophthalmology;  Laterality: Right;  Latex   CATARACT EXTRACTION W/PHACO Left 05/14/2019   Procedure: CATARACT EXTRACTION PHACO AND INTRAOCULAR LENS PLACEMENT (IOC) LEFT SYMFONY LENS, 0.25, 00:04.9;  Surgeon: Nevada Crane, MD;  Location: [facility];  Service: Ophthalmology;  Laterality: Left;   COLONOSCOPY WITH PROPOFOL N/A 12/16/2016   Procedure: COLONOSCOPY WITH PROPOFOL;  Surgeon: Midge Minium, MD;  Location: [facility];  Service: Endoscopy;  Laterality: N/A;   HAND SURGERY Left 2008   trauma surgery after dog bite   KNEE ARTHROSCOPY     LAMINECTOMY  1992   MENISCUS REPAIR Right 01/2014   POLYPECTOMY  12/16/2016   Procedure: POLYPECTOMY INTESTINAL;  Surgeon: Midge Minium, MD;  Location: [facility];  Service: Endoscopy;;   TOTAL ABDOMINAL HYSTERECTOMY  2011    Social History   Tobacco Use   Smoking status: Former    Years: 10    Types: Cigarettes    Quit date: 1988    Years since quitting: 36.3   Smokeless tobacco: Never  Vaping Use   Vaping Use: Never used  Substance Use Topics   Alcohol use: Yes    Alcohol/week: 0.0 standard drinks of alcohol    Comment: may have a drink several times per year   Drug use: No     Medication list has been reviewed and updated.  Current Meds  Medication Sig   albuterol (VENTOLIN HFA) 108 (90 Base) MCG/ACT inhaler Inhale 2 puffs into the lungs every 6 (six) hours as needed for wheezing or shortness of breath.   ALPRAZolam (XANAX) 0.25 MG tablet Take 1 tablet (0.25 mg total) by mouth daily as needed for anxiety.   Clocortolone Pivalate (CLODERM) 0.1 % cream Apply 1 application topically 2 (two) times daily.   conjugated estrogens (PREMARIN) vaginal cream Insert 1 g vaginally once weekly as maintenance   diclofenac (VOLTAREN) 75 MG EC tablet Take 75 mg by mouth 2 (two) times daily as needed.   diclofenac Sodium (VOLTAREN) 1 % GEL Apply  topically as needed.   diphenhydrAMINE (BENADRYL) 50 MG capsule Take 50 mg by mouth every 6 (six) hours as needed.   Doxepin HCl 5 % CREA Apply topically.   estradiol (CLIMARA - DOSED IN MG/24 HR) 0.05 mg/24hr patch Place 1 patch (0.05 mg total) onto the skin once a week.   lansoprazole (PREVACID) 30 MG capsule Take 1 capsule (30 mg total) by mouth daily at 12 noon.   lisinopril-hydrochlorothiazide (ZESTORETIC) 20-12.5 MG tablet TAKE 1 TABLET BY MOUTH EVERY DAY   metaxalone (SKELAXIN) 800 MG tablet Take 800 mg by mouth 3 (three) times daily.   oxyCODONE-acetaminophen (PERCOCET) 10-325 MG tablet Take 1-2 tablets by mouth every 4 (four) hours.   Probiotic Product (PROBIOTIC DAILY PO) Take by  mouth daily.   Pseudoephedrine-APAP-DM (DAYQUIL PO) Take by mouth as needed.   SUMAtriptan (IMITREX) 100 MG tablet Take 1 tablet (100 mg total) by mouth daily as needed.       01/22/2022   11:11 AM 12/01/2021   10:33 AM 06/26/2021    9:46 AM 06/12/2021    2:55 PM  GAD 7 : Generalized Anxiety Score  Nervous, Anxious, on Edge Control/stop worrying 1 1 0 3  Worry too much - different things 2 1 0 3  Trouble relaxing 1 3 0 2  Restless 1 1 0 2  Easily annoyed or irritable 0 3 0 2  Afraid - awful might happen 0 0 0 0  Total GAD 7 Score Anxiety Difficulty Not difficult at all Somewhat difficult Not difficult at all Somewhat difficult       01/22/2022   11:11 AM 12/01/2021   10:32 AM 06/26/2021    9:46 AM  Depression screen PHQ 2/9  Decreased Interest 1 2 0  Down, Depressed, Hopeless 1 1 0  PHQ - 2 Score 2 3 0  Altered sleeping 0 3 2  Tired, decreased energy Change in appetite 0 1 0  Feeling bad or failure about yourself  0 1 0  Trouble concentrating 1 3 0  Moving slowly or fidgety/restless 0 0 0  Suicidal thoughts 0 0 0  PHQ-9 Score Difficult doing work/chores Not difficult at all Somewhat difficult Somewhat difficult    BP Readings from Last 3 Encounters:   09/30/22 (!) 150/100  01/22/22 128/84  12/01/21 110/68    Physical Exam Vitals and nursing note reviewed.  HENT:     Mouth/Throat:     Mouth: Mucous membranes are moist.  Eyes:     Conjunctiva/sclera:     Right eye: Right conjunctiva is not injected.     Left eye: Left conjunctiva is not injected.  Cardiovascular:     Rate and Rhythm: Normal rate.     Heart sounds: S1 normal. No murmur heard.    No systolic murmur is present.     No diastolic murmur is present.  Abdominal:     General: Bowel sounds are normal. There is no distension.     Palpations: Abdomen is soft. There is no hepatomegaly, splenomegaly or mass.     Tenderness: There is abdominal tenderness in the periumbilical area. There is no guarding.     Comments: Tender superior to umbillicus  Neurological:     Mental Status: She is alert.     Wt Readings from Last 3 Encounters:  09/30/22 208 lb (94.3 kg)  01/22/22 200 lb 9.6 oz (91 kg)  12/01/21 199 lb (90.3 kg)    BP (!) 150/100   Pulse 70   Ht  (1.676 m)   Wt 208 lb (94.3 kg)   SpO2 98%   BMI 33.57 kg/m   Assessment and Plan: 1. Periumbilical abdominal pain Chronic.  Waxes and wanes and episodic for years.  Examination is notes exquisite tenderness to deep palpation superior to the umbilicus but no palpable defect or umbilical hernia noted.  Although this is behaving like an early umbilical hernia there is no evidence of nonreducibility or incarceration.  Will initiate evaluation of chronic abdominal pain of an episodic nature that waxes and wanes in intensity with lab work to include CBC, hepatic function panel, lipase, and renal function panel.  Will  obtain an ultrasound of the abdomen.  And have discussed with Dr. Judithann Graves and that patient will be coming in next week for medication refills and to reevaluate the discomfort and review labs and if ultrasound has been obtained results thereof.  If pain should increase prior to reevaluation patient should  go to the emergency room of choice. - US Abdomen Complete; Future - CBC with Differential/Platelet - Hepatic Function Panel (6) - Lipase - Renal Function Panel     Elizabeth Sauer, MD

## 2022-10-01 LAB — RENAL FUNCTION PANEL
Albumin: 4.6 g/dL (ref 3.8–4.9)
BUN/Creatinine Ratio: 20 (ref 12–28)
BUN: 17 mg/dL (ref 8–27)
CO2: 26 mmol/L (ref 20–29)
Calcium: 9.2 mg/dL (ref 8.7–10.3)
Chloride: 98 mmol/L (ref 96–106)
Creatinine, Ser: 0.84 mg/dL (ref 0.57–1.00)
Glucose: 86 mg/dL (ref 70–99)
Phosphorus: 3.5 mg/dL (ref 3.0–4.3)
Potassium: 4.3 mmol/L (ref 3.5–5.2)
Sodium: 137 mmol/L (ref 134–144)
eGFR: 80 mL/min/{1.73_m2} (ref >59)

## 2022-10-01 LAB — HEPATIC FUNCTION PANEL (6)
ALT: 12 IU/L (ref 0–32)
AST: 14 IU/L (ref 0–40)
Alkaline Phosphatase: 100 IU/L (ref 44–121)
Bilirubin Total: 0.4 mg/dL (ref 0.0–1.2)
Bilirubin, Direct: <0.1 mg/dL (ref 0.00–0.40)

## 2022-10-01 LAB — CBC WITH DIFFERENTIAL/PLATELET
Basophils Absolute: 0 10*3/uL (ref 0.0–0.2)
Basos: 1 %
EOS (ABSOLUTE): 0 10*3/uL (ref 0.0–0.4)
Eos: 1 %
Hematocrit: 36.4 % (ref 34.0–46.6)
Hemoglobin: 12 g/dL (ref 11.1–15.9)
Immature Grans (Abs): 0 10*3/uL (ref 0.0–0.1)
Immature Granulocytes: 0 %
Lymphocytes Absolute: 1.3 10*3/uL (ref 0.7–3.1)
Lymphs: 29 %
MCH: 29.3 pg (ref 26.6–33.0)
MCHC: 33 g/dL (ref 31.5–35.7)
MCV: 89 fL (ref 79–97)
Monocytes Absolute: 0.4 10*3/uL (ref 0.1–0.9)
Monocytes: 8 %
Neutrophils Absolute: 2.6 10*3/uL (ref 1.4–7.0)
Neutrophils: 61 %
Platelets: 351 10*3/uL (ref 150–450)
RBC: 4.09 x10E6/uL (ref 3.77–5.28)
RDW: 12.4 % (ref 11.7–15.4)
WBC: 4.3 10*3/uL (ref 3.4–10.8)

## 2022-10-01 LAB — LIPASE: Lipase: 23 U/L (ref 14–72)

## 2022-10-05 ENCOUNTER — Encounter: Payer: Self-pay | Admitting: Internal Medicine

## 2022-10-05 ENCOUNTER — Ambulatory Visit (INDEPENDENT_AMBULATORY_CARE_PROVIDER_SITE_OTHER): Payer: Medicare Other | Admitting: Internal Medicine

## 2022-10-05 VITALS — BP 122/78 | HR 90 | Ht 66.0 in | Wt 208.0 lb

## 2022-10-05 DIAGNOSIS — K439 Ventral hernia without obstruction or gangrene: Secondary | ICD-10-CM

## 2022-10-05 DIAGNOSIS — Z531 Procedure and treatment not carried out because of patient's decision for reasons of belief and group pressure: Secondary | ICD-10-CM | POA: Diagnosis not present

## 2022-10-05 DIAGNOSIS — N941 Unspecified dyspareunia: Secondary | ICD-10-CM

## 2022-10-05 DIAGNOSIS — L299 Pruritus, unspecified: Secondary | ICD-10-CM

## 2022-10-05 DIAGNOSIS — N951 Menopausal and female climacteric states: Secondary | ICD-10-CM

## 2022-10-05 DIAGNOSIS — N952 Postmenopausal atrophic vaginitis: Secondary | ICD-10-CM

## 2022-10-05 DIAGNOSIS — I10 Essential (primary) hypertension: Secondary | ICD-10-CM

## 2022-10-05 DIAGNOSIS — IMO0001 Reserved for inherently not codable concepts without codable children: Secondary | ICD-10-CM | POA: Insufficient documentation

## 2022-10-05 MED ORDER — CLOCORTOLONE PIVALATE 0.1 % EX CREA
1.0000 | TOPICAL_CREAM | Freq: Two times a day (BID) | CUTANEOUS | 0 refills | Status: DC
Start: 2022-10-05 — End: 2022-11-25

## 2022-10-05 MED ORDER — ESTRADIOL 0.05 MG/24HR TD PTWK
0.0500 mg | MEDICATED_PATCH | TRANSDERMAL | 3 refills | Status: DC
Start: 2022-10-05 — End: 2023-12-05

## 2022-10-05 MED ORDER — PREMARIN 0.625 MG/GM VA CREA: TOPICAL_CREAM | VAGINAL | 0 refills | Status: DC

## 2022-10-05 NOTE — Progress Notes (Signed)
Date:  10/05/2022   Name:  ROBINA HAMOR   DOB:  10-01-1960   MRN:  [id number]   Chief Complaint: Hypertension and Depression  Hypertension This is a chronic problem. The problem is controlled. Pertinent negatives include no chest pain, headaches, palpitations or shortness of breath. Past treatments include ACE inhibitors and diuretics. The current treatment provides significant improvement.  Abdominal Pain This is a recurrent problem. The pain is located in the epigastric region. The pain is moderate. The abdominal pain does not radiate. Associated symptoms include constipation and nausea. Pertinent negatives include no diarrhea or headaches. The pain is relieved by Eating. She has tried proton pump inhibitors for the symptoms. Prior workup: [name] shceduled for tomorrow.    Lab Results  Component Value Date   NA 137 09/30/2022   K 4.3 09/30/2022   CO2 26 09/30/2022   GLUCOSE 86 09/30/2022   BUN 17 09/30/2022   CREATININE 0.84 09/30/2022   CALCIUM 9.2 09/30/2022   EGFR 80 09/30/2022   GFRNONAA 83 11/20/2019   Lab Results  Component Value Date   CHOL 257 (H) 12/01/2021   HDL 59 12/01/2021   LDLCALC 171 (H) 12/01/2021   TRIG 149 12/01/2021   CHOLHDL 4.4 12/01/2021   Lab Results  Component Value Date   TSH 4.050 12/01/2021   Lab Results  Component Value Date   HGBA1C 5.2 12/01/2021   Lab Results  Component Value Date   WBC 4.3 09/30/2022   HGB 12.0 09/30/2022   HCT 36.4 09/30/2022   MCV 89 09/30/2022   PLT 351 09/30/2022   Lab Results  Component Value Date   ALT 12 09/30/2022   AST 14 09/30/2022   ALKPHOS 100 09/30/2022   BILITOT 0.4 09/30/2022   No results found for: "25OHVITD2", "25OHVITD3", "VD25OH"   Review of Systems  Constitutional:  Negative for fatigue and unexpected weight change.  HENT:  Negative for nosebleeds.   Eyes:  Negative for visual disturbance.  Respiratory:  Negative for cough, chest tightness, shortness of breath and wheezing.    Cardiovascular:  Negative for chest pain, palpitations and leg swelling.  Gastrointestinal:  Positive for abdominal pain, constipation and nausea. Negative for diarrhea.  Neurological:  Positive for light-headedness (several episodes of lightheadedness when she doesn't eat all day). Negative for dizziness, weakness and headaches.    Patient Active Problem List   Diagnosis Date Noted   Refusal of blood transfusions as patient is Jehovah's Witness 10/05/2022   Generalized anxiety disorder 01/22/2022   History of cervical dysplasia 06/30/2021   Dyspareunia in female 06/30/2021   Chronic idiopathic constipation 11/20/2019   Drug-induced constipation 11/10/2017   Polyp of sigmoid colon    Hyperlipidemia, mild 11/10/2016   ACL (anterior cruciate ligament) tear 10/21/2015   Rosacea 10/21/2015   Fibrocystic breast changes 06/06/2015   Essential (primary) hypertension 12/03/2014   Urge incontinence 12/03/2014   H/O peptic ulcer 12/03/2014   Fibromyalgia 12/03/2014   Migraine without aura and without status migrainosus, not intractable 12/03/2014   Mild intermittent asthma without complication 12/03/2014   Hot flash, menopausal 12/03/2014   Idiopathic insomnia 12/03/2014   Family history of breast cancer 12/03/2014    Allergies  Allergen Reactions   Aspirin Shortness Of Breath   Hydrocodone Shortness Of Breath   Ambien [Zolpidem]     Suicidal, also caused vision issues   Bupropion Hcl     intolerance   Divalproex Sodium Er Nausea And Vomiting   Duloxetine Hcl  intolerance   Escitalopram     intolerance   Macrobid [Nitrofurantoin]     Severe abdominal pain   Morphine Sulfate Itching   Naproxen     delusions   Ampicillin Rash   Codeine Itching and Rash        Latex Rash    Gloves - when worn for extended period   Oxybutynin Other (See Comments)    Headache   Sulfa Antibiotics Rash    Past Surgical History:  Procedure Laterality Date   arm surgery Left 2018    reduction osteotomy   BACK SURGERY     CATARACT EXTRACTION W/PHACO Right 04/16/2019   Procedure: CATARACT EXTRACTION PHACO AND INTRAOCULAR LENS PLACEMENT (IOC) RIGHT SYMFONY LENS 00:31.0  9.5%  3.03;  Surgeon: Nevada Crane, MD;  Location: [facility];  Service: Ophthalmology;  Laterality: Right;  Latex   CATARACT EXTRACTION W/PHACO Left 05/14/2019   Procedure: CATARACT EXTRACTION PHACO AND INTRAOCULAR LENS PLACEMENT (IOC) LEFT SYMFONY LENS, 0.25, 00:04.9;  Surgeon: Nevada Crane, MD;  Location: [facility];  Service: Ophthalmology;  Laterality: Left;   COLONOSCOPY WITH PROPOFOL N/A 12/16/2016   Procedure: COLONOSCOPY WITH PROPOFOL;  Surgeon: Midge Minium, MD;  Location: [facility];  Service: Endoscopy;  Laterality: N/A;   HAND SURGERY Left 2008   trauma surgery after dog bite   KNEE ARTHROSCOPY     LAMINECTOMY  1992   MENISCUS REPAIR Right 01/2014   POLYPECTOMY  12/16/2016   Procedure: POLYPECTOMY INTESTINAL;  Surgeon: Midge Minium, MD;  Location: [facility];  Service: Endoscopy;;   TOTAL ABDOMINAL HYSTERECTOMY  2011    Social History   Tobacco Use   Smoking status: Former    Years: 10    Types: Cigarettes    Quit date: 1988    Years since quitting: 36.3   Smokeless tobacco: Never  Vaping Use   Vaping Use: Never used  Substance Use Topics   Alcohol use: Yes    Alcohol/week: 0.0 standard drinks of alcohol    Comment: may have a drink several times per year   Drug use: No     Medication list has been reviewed and updated.  Current Meds  Medication Sig   albuterol (PROVENTIL) (2.5 MG/3ML) 0.083% nebulizer solution Take 3 mLs (2.5 mg total) by nebulization every 6 (six) hours as needed for wheezing or shortness of breath.   albuterol (VENTOLIN HFA) 108 (90 Base) MCG/ACT inhaler Inhale 2 puffs into the lungs every 6 (six) hours as needed for wheezing or shortness of breath.   ALPRAZolam (XANAX) 0.25 MG tablet Take 1 tablet (0.25 mg total)  by mouth daily as needed for anxiety.   diclofenac (VOLTAREN) 75 MG EC tablet Take 75 mg by mouth 2 (two) times daily as needed.   diclofenac Sodium (VOLTAREN) 1 % GEL Apply topically as needed.   diphenhydrAMINE (BENADRYL) 50 MG capsule Take 50 mg by mouth every 6 (six) hours as needed.   Doxepin HCl 5 % CREA Apply topically.   gentamicin cream (GARAMYCIN) 0.1 % Apply 1 application topically 2 (two) times daily.   lansoprazole (PREVACID) 30 MG capsule Take 1 capsule (30 mg total) by mouth daily at 12 noon.   lisinopril-hydrochlorothiazide (ZESTORETIC) 20-12.5 MG tablet TAKE 1 TABLET BY MOUTH EVERY DAY   metaxalone (SKELAXIN) 800 MG tablet Take 800 mg by mouth 3 (three) times daily.   oxyCODONE-acetaminophen (PERCOCET) 10-325 MG tablet Take 1-2 tablets by mouth every 4 (four) hours.   Probiotic  Product (PROBIOTIC DAILY PO) Take by mouth daily.   Pseudoephedrine-APAP-DM (DAYQUIL PO) Take by mouth as needed.   SUMAtriptan (IMITREX) 100 MG tablet Take 1 tablet (100 mg total) by mouth daily as needed.   [DISCONTINUED] Clocortolone Pivalate (CLODERM) 0.1 % cream Apply 1 application topically 2 (two) times daily.   [DISCONTINUED] conjugated estrogens (PREMARIN) vaginal cream Insert 1 g vaginally once weekly as maintenance   [DISCONTINUED] estradiol (CLIMARA - DOSED IN MG/24 HR) 0.05 mg/24hr patch Place 1 patch (0.05 mg total) onto the skin once a week.       10/05/2022    1:21 PM 01/22/2022   11:11 AM 12/01/2021   10:33 AM 06/26/2021    9:46 AM  GAD 7 : Generalized Anxiety Score  Nervous, Anxious, on Edge 1 1 3 1   Control/stop worrying 0 1 1 0  Worry too much - different things 3 2 1  0  Trouble relaxing 2 1 3  0  Restless 1 1 1  0  Easily annoyed or irritable 1 0 3 0  Afraid - awful might happen 0 0 0 0  Total GAD 7 Score 8 6 12 1   Anxiety Difficulty Very difficult Not difficult at all Somewhat difficult Not difficult at all       10/05/2022    1:21 PM 01/22/2022   11:11 AM 12/01/2021    10:32 AM  Depression screen PHQ 2/9  Decreased Interest 2 1 2   Down, Depressed, Hopeless 1 1 1   PHQ - 2 Score 3 2 3   Altered sleeping 3 0 3  Tired, decreased energy 3 3 3   Change in appetite 1 0 1  Feeling bad or failure about yourself  1 0 1  Trouble concentrating 2 1 3   Moving slowly or fidgety/restless 2 0 0  Suicidal thoughts 0 0 0  PHQ-9 Score 15 6 14   Difficult doing work/chores Very difficult Not difficult at all Somewhat difficult    BP Readings from Last 3 Encounters:  10/05/22 122/78  09/30/22 (!) 150/100  01/22/22 128/84    Physical Exam Vitals and nursing note reviewed.  Constitutional:      General: She is not in acute distress.    Appearance: She is well-developed.  HENT:     Head: Normocephalic and atraumatic.  Cardiovascular:     Rate and Rhythm: Normal rate and regular rhythm.  Pulmonary:     Effort: Pulmonary effort is normal. No respiratory distress.  Abdominal:     General: Bowel sounds are normal.     Palpations: Abdomen is soft.     Tenderness: There is abdominal tenderness in the epigastric area.     Hernia: A hernia is present. Hernia is present in the ventral area.    Skin:    General: Skin is warm and dry.     Findings: No rash.  Neurological:     Mental Status: She is alert and oriented to person, place, and time.  Psychiatric:        Mood and Affect: Mood normal.        Behavior: Behavior normal.     Wt Readings from Last 3 Encounters:  10/05/22 208 lb (94.3 kg)  09/30/22 208 lb (94.3 kg)  01/22/22 200 lb 9.6 oz (91 kg)    BP 122/78   Pulse 90   Ht 5\' 6"  (1.676 m)   Wt 208 lb (94.3 kg)   SpO2 99%   BMI 33.57 kg/m   Assessment and Plan:  Problem List Items Addressed  This Visit       Cardiovascular and Mediastinum   Essential (primary) hypertension - Primary (Chronic)    Stable exam with well controlled BP.  Currently taking lisinopril hct. Tolerating medications without concerns or side effects. Will continue to  recommend low sodium diet and current regimen.         Other   Dyspareunia in female   Relevant Medications   conjugated estrogens (PREMARIN) vaginal cream   estradiol (CLIMARA - DOSED IN MG/24 HR) 0.05 mg/24hr patch   Refusal of blood transfusions as patient is Jehovah's Witness   Other Visit Diagnoses     Ventral hernia without obstruction or gangrene       [name] scheduled for tomorrow will call when results are in will likely need General Surgery consultation   Vaginal atrophy       Relevant Medications   conjugated estrogens (PREMARIN) vaginal cream   Vasomotor symptoms due to menopause       Relevant Medications   estradiol (CLIMARA - DOSED IN MG/24 HR) 0.05 mg/24hr patch   Pruritic condition       Relevant Medications   Clocortolone Pivalate (CLODERM) 0.1 % cream       No follow-ups on file.   Partially dictated using Dragon software, any errors are not intentional.  Reubin Milan, MD [facility] [place], [place]

## 2022-10-05 NOTE — Assessment & Plan Note (Signed)
Stable exam with well controlled BP.  Currently taking lisinopril hct. Tolerating medications without concerns or side effects. Will continue to recommend low sodium diet and current regimen.

## 2022-10-06 ENCOUNTER — Ambulatory Visit
Admission: RE | Admit: 2022-10-06 | Discharge: 2022-10-06 | Disposition: A | Discharge status: Home / Self Care | Payer: Medicare Other | Source: Ambulatory Visit | Attending: Family Medicine | Admitting: Family Medicine

## 2022-10-06 DIAGNOSIS — R1033 Periumbilical pain: Secondary | ICD-10-CM

## 2022-10-06 DIAGNOSIS — R109 Unspecified abdominal pain: Secondary | ICD-10-CM | POA: Diagnosis not present

## 2022-10-07 ENCOUNTER — Other Ambulatory Visit: Payer: Self-pay | Admitting: Internal Medicine

## 2022-10-07 ENCOUNTER — Telehealth: Payer: Self-pay

## 2022-10-07 DIAGNOSIS — K439 Ventral hernia without obstruction or gangrene: Secondary | ICD-10-CM

## 2022-10-07 NOTE — Telephone Encounter (Signed)
Called patient and informed us was normal. Told patient per Dr Judithann Graves next steps are a CT Scan. She agrees to plan, and would like this ordered.  - Alicia Nichols

## 2022-10-14 ENCOUNTER — Ambulatory Visit
Admission: RE | Admit: 2022-10-14 | Discharge: 2022-10-14 | Disposition: A | Discharge status: Home / Self Care | Payer: Medicare Other | Source: Ambulatory Visit | Attending: Internal Medicine | Admitting: Internal Medicine

## 2022-10-14 DIAGNOSIS — R109 Unspecified abdominal pain: Secondary | ICD-10-CM | POA: Diagnosis not present

## 2022-10-14 DIAGNOSIS — K439 Ventral hernia without obstruction or gangrene: Secondary | ICD-10-CM | POA: Insufficient documentation

## 2022-10-14 DIAGNOSIS — R1900 Intra-abdominal and pelvic swelling, mass and lump, unspecified site: Secondary | ICD-10-CM | POA: Diagnosis not present

## 2022-10-14 MED ORDER — IOHEXOL 300 MG/ML  SOLN
100.0000 mL | Freq: Once | INTRAMUSCULAR | Status: AC | PRN
  Administered 2022-10-14: 100 mL via INTRAVENOUS

## 2022-10-18 ENCOUNTER — Telehealth: Payer: Self-pay | Admitting: Internal Medicine

## 2022-10-18 NOTE — Telephone Encounter (Signed)
Copied from CRM [id number]. Topic: General - Other >> Oct 18, 2022  4:10 PM Franchot Heidelberg wrote: Reason for CRM: Pt called for imaging results, please advise. She stated current abdominal pain, however insisted that she did not want to speak to NT.

## 2022-10-19 ENCOUNTER — Other Ambulatory Visit: Payer: Self-pay

## 2022-10-19 DIAGNOSIS — K439 Ventral hernia without obstruction or gangrene: Secondary | ICD-10-CM

## 2022-10-19 DIAGNOSIS — L821 Other seborrheic keratosis: Secondary | ICD-10-CM | POA: Diagnosis not present

## 2022-10-19 DIAGNOSIS — L2089 Other atopic dermatitis: Secondary | ICD-10-CM | POA: Diagnosis not present

## 2022-10-19 DIAGNOSIS — L570 Actinic keratosis: Secondary | ICD-10-CM | POA: Diagnosis not present

## 2022-10-19 NOTE — Progress Notes (Signed)
Placed ref to surg

## 2022-10-25 ENCOUNTER — Ambulatory Visit: Payer: Medicare Other | Admitting: Surgery

## 2022-10-25 ENCOUNTER — Encounter: Payer: Self-pay | Admitting: Surgery

## 2022-10-25 ENCOUNTER — Telehealth: Payer: Self-pay

## 2022-10-25 VITALS — BP 166/90 | HR 65 | Temp 98.7°F | Ht 66.0 in | Wt 210.4 lb

## 2022-10-25 DIAGNOSIS — K429 Umbilical hernia without obstruction or gangrene: Secondary | ICD-10-CM

## 2022-10-25 DIAGNOSIS — K436 Other and unspecified ventral hernia with obstruction, without gangrene: Secondary | ICD-10-CM | POA: Diagnosis not present

## 2022-10-25 DIAGNOSIS — K420 Umbilical hernia with obstruction, without gangrene: Secondary | ICD-10-CM

## 2022-10-25 DIAGNOSIS — K439 Ventral hernia without obstruction or gangrene: Secondary | ICD-10-CM

## 2022-10-25 NOTE — Progress Notes (Unsigned)
10/25/2022  Reason for Visit: Umbilical and ventral hernias  Requesting Provider: Bari Edward, MD  History of Present Illness: Alicia Nichols is a 62 y.o. female presenting for evaluation of abdominal hernias.  She saw her PCP on 10/05/22 due to abdominal pain in the epigastric area.  She reports she's had bulging in her abdomen for a few years and reports the pain has been progressing.  Denies nausea/vomiting, but does reports constipation issues.  She has had a prior umbilical hernia repair when she was a child.  She used to do a lot of heavy lifting and strenuous activities with her work and at home. She has decreased some of this strain.  She reports the bulging always being present, but worsens when she does something more strenuous.  She initially had an U/S of her abdomen which did not show any issues, but follow up CT scan did show two small hernia defects at the umbilicus and a few cm superior to it.  The epigastric hernia does contain fatty tissue, and the sac itself is about 3 cm.  The epigastric area is the area of pain.  Past Medical History: Past Medical History:  Diagnosis Date   Anemia    Asthma    BRCA negative 07/2021   MyRisk neg   Family history of adverse reaction to anesthesia    sister awareness under anesthesia   Family history of breast cancer    Fibromyalgia    GERD (gastroesophageal reflux disease)    Headache    Hypertension    Increased risk of breast cancer 07/2021   IBIS=14.5%/riskscore=23.7%   Motion sickness    cars, roller coasters   Osteoarthritis      Past Surgical History: Past Surgical History:  Procedure Laterality Date   arm surgery Left 2018   reduction osteotomy   BACK SURGERY     CATARACT EXTRACTION W/PHACO Right 04/16/2019   Procedure: CATARACT EXTRACTION PHACO AND INTRAOCULAR LENS PLACEMENT (IOC) RIGHT SYMFONY LENS 00:31.0  9.5%  3.03;  Surgeon: Nevada Crane, MD;  Location: [facility];  Service: Ophthalmology;   Laterality: Right;  Latex   CATARACT EXTRACTION W/PHACO Left 05/14/2019   Procedure: CATARACT EXTRACTION PHACO AND INTRAOCULAR LENS PLACEMENT (IOC) LEFT SYMFONY LENS, 0.25, 00:04.9;  Surgeon: Nevada Crane, MD;  Location: [facility];  Service: Ophthalmology;  Laterality: Left;   COLONOSCOPY WITH PROPOFOL N/A 12/16/2016   Procedure: COLONOSCOPY WITH PROPOFOL;  Surgeon: Midge Minium, MD;  Location: [facility];  Service: Endoscopy;  Laterality: N/A;   HAND SURGERY Left 2008   trauma surgery after dog bite   KNEE ARTHROSCOPY     LAMINECTOMY  1992   MENISCUS REPAIR Right 01/2014   POLYPECTOMY  12/16/2016   Procedure: POLYPECTOMY INTESTINAL;  Surgeon: Midge Minium, MD;  Location: [facility];  Service: Endoscopy;;   TOTAL ABDOMINAL HYSTERECTOMY  2011    Home Medications: Prior to Admission medications   Medication Sig Start Date End Date Taking? Authorizing Provider  albuterol (PROVENTIL) (2.5 MG/3ML) 0.083% nebulizer solution Take 3 mLs (2.5 mg total) by nebulization every 6 (six) hours as needed for wheezing or shortness of breath. 01/22/22  Yes Reubin Milan, MD  albuterol (VENTOLIN HFA) 108 (90 Base) MCG/ACT inhaler Inhale 2 puffs into the lungs every 6 (six) hours as needed for wheezing or shortness of breath. 09/29/20  Yes Reubin Milan, MD  ALPRAZolam Prudy Feeler) 0.25 MG tablet Take 1 tablet (0.25 mg total) by mouth daily as needed  for anxiety. 01/22/22  Yes Reubin Milan, MD  Clocortolone Pivalate (CLODERM) 0.1 % cream Apply 1 Application topically 2 (two) times daily. 10/05/22  Yes Reubin Milan, MD  conjugated estrogens (PREMARIN) vaginal cream Insert 1 g vaginally once weekly as maintenance 10/05/22  Yes Reubin Milan, MD  diclofenac (VOLTAREN) 75 MG EC tablet Take 75 mg by mouth 2 (two) times daily as needed. 08/30/20  Yes [provider]  diclofenac Sodium (VOLTAREN) 1 % GEL Apply topically as needed.   Yes [provider]   diphenhydrAMINE (BENADRYL) 50 MG capsule Take 50 mg by mouth every 6 (six) hours as needed.   Yes [provider]  Doxepin HCl 5 % CREA Apply topically.   Yes [provider]  estradiol (CLIMARA - DOSED IN MG/24 HR) 0.05 mg/24hr patch Place 1 patch (0.05 mg total) onto the skin once a week. 10/05/22  Yes Reubin Milan, MD  gentamicin cream (GARAMYCIN) 0.1 % Apply 1 application topically 2 (two) times daily. 06/26/21  Yes Felecia Shelling, DPM  lansoprazole (PREVACID) 30 MG capsule Take 1 capsule (30 mg total) by mouth daily at 12 noon. 12/01/21  Yes Reubin Milan, MD  lisinopril-hydrochlorothiazide (ZESTORETIC) 20-12.5 MG tablet TAKE 1 TABLET BY MOUTH EVERY DAY 08/27/22  Yes Reubin Milan, MD  metaxalone (SKELAXIN) 800 MG tablet Take 800 mg by mouth 3 (three) times daily. 06/08/21  Yes [provider]  oxyCODONE-acetaminophen (PERCOCET) 10-325 MG tablet Take 1-2 tablets by mouth every 4 (four) hours. 06/26/21  Yes [provider]  Probiotic Product (PROBIOTIC DAILY PO) Take by mouth daily.   Yes [provider]  Pseudoephedrine-APAP-DM (DAYQUIL PO) Take by mouth as needed.   Yes [provider]  SUMAtriptan (IMITREX) 100 MG tablet Take 1 tablet (100 mg total) by mouth daily as needed. 12/01/21  Yes Reubin Milan, MD    Allergies: Allergies  Allergen Reactions   Aspirin Shortness Of Breath   Hydrocodone Shortness Of Breath   Ambien [Zolpidem]     Suicidal, also caused vision issues   Bupropion Hcl     intolerance   Divalproex Sodium Er Nausea And Vomiting   Duloxetine Hcl     intolerance   Escitalopram     intolerance   Macrobid [Nitrofurantoin]     Severe abdominal pain   Morphine Sulfate Itching   Naproxen     delusions   Ampicillin Rash   Codeine Itching and Rash        Latex Rash    Gloves - when worn for extended period   Oxybutynin Other (See Comments)    Headache   Sulfa Antibiotics Rash    Social History:   reports that she quit smoking about 36 years ago. Her smoking use included cigarettes. She has never used smokeless tobacco. She reports current alcohol use. She reports that she does not use drugs.   Family History: Family History  Problem Relation Age of Onset   Rheum arthritis Mother    Breast cancer Maternal Aunt        62s   Breast cancer Maternal Aunt        50s/60s   Breast cancer Maternal Grandmother        21s   Breast cancer Cousin        24s   Breast cancer Cousin        5s    Review of Systems: Review of Systems  Constitutional:  Negative for chills  and fever.  HENT:  Negative for hearing loss.   Respiratory:  Negative for shortness of breath.   Cardiovascular:  Negative for chest pain.  Gastrointestinal:  Positive for abdominal pain and constipation. Negative for diarrhea, nausea and vomiting.  Genitourinary:  Negative for dysuria.  Musculoskeletal:  Positive for back pain.  Skin:  Negative for rash.  Neurological:  Negative for dizziness.  Psychiatric/Behavioral:  Negative for depression.     Physical Exam BP (!) 166/90   Pulse 65   Temp 98.7 F (37.1 C) (Oral)   Ht 5\' 6"  (1.676 m)   Wt 210 lb 6.4 oz (95.4 kg)   SpO2 98%   BMI 33.96 kg/m  CONSTITUTIONAL: No acute distress, well nourished. HEENT:  Normocephalic, atraumatic, extraocular motion intact. NECK: Trachea is midline, and there is no jugular venous distension.  RESPIRATORY:  Lungs are clear, and breath sounds are equal bilaterally. Normal respiratory effort without pathologic use of accessory muscles. CARDIOVASCULAR: Heart is regular without murmurs, gallops, or rubs. GI: The abdomen is soft, non-distended, with localized tenderness over the epigastric area at the site of her hernia.  This is incarcerated with some tenderness, but without any overlying skin changes or peritonitis to suggest strangulation.  At the umbilicus she also has a smaller hernia, also incarcerated, but with milder symptoms  only. MUSCULOSKELETAL:  Normal muscle strength and tone in all four extremities.  No peripheral edema or cyanosis. SKIN: Skin turgor is normal. There are no pathologic skin lesions.  NEUROLOGIC:  Motor and sensation is grossly normal.  Cranial nerves are grossly intact. PSYCH:  Alert and oriented to person, place and time. Affect is normal.  Laboratory Analysis: Labs from 09/30/22: Na 137, K 4.3, Cl 98, CO2 26, BUN 17, Cr 0.84.  Total bili 0.4, AST 14, ALT 12, Alk Phos 100, albumin 4.6.  WBC 4.3, Hgb 12, Hct 36.4, Plt 351.  Imaging: CT abdomen 10/14/22: IMPRESSION: 1. Small ventral midline fat-containing hernia underlying the skin marker with hernia neck measuring 1.3 x 1.0 cm. The transverse colon closely abuts the hernia. A separate, smaller fat-containing hernia is located immediately inferiorly. 2.  Aortic Atherosclerosis (ICD10-I70.0).  Assessment and Plan: This is a 62 y.o. female with a recurrent umbilical and a new supraumbilical ventral hernia, both incarcerated.  --Discussed with the patient the findings on her CT scan and exam today.  Her two hernias are small as far as the defect size, but the epigastric one is more tender.  Both are incarcerated, but without evidence of strangulation.  Discussed with her the role for surgery to repair the hernias and use of mesh for reinforcement.  She is concerned about the use of mesh as she has friends that have had complications from mesh use.  Discussed with her that primary repair alone does put her at increased risk of recurrence, and potentially we could use an absorbable mesh that would help with strengthening the repair while not being permanent.  She's in agreement with this. --Discussed with her then the plan for a robotic assisted ventral hernia repair.  Reviewed the surgery at length with her including the planned incisions, the risks of bleeding, infection, injury to surrounding structures, that this would be an outpatient surgery,  post-operative activity restriction, pain control, and she's willing to proceed. --Will schedule her for surgery on 11/25/22. All of her questions have been answered.  I spent 55 minutes dedicated to the care of this patient on the date of this encounter to include pre-visit review  of records, face-to-face time with the patient discussing diagnosis and management, and any post-visit coordination of care.   Howie Ill, MD [facility]

## 2022-10-25 NOTE — Patient Instructions (Signed)
Our surgery scheduler Barbara will call you within 24-48 hours to get you scheduled. If you have not heard from her after 48 hours, please call our office. Have the blue sheet available when she calls to write down important information.   If you have any concerns or questions, please feel free to call our office.   Laparoscopic Ventral Hernia Repair Laparoscopic ventral hernia repairis a procedure to fix a bulge of tissue that pushes through a weak area of muscle in the abdomen (ventral hernia). A ventral hernia may be: Above the belly button. This is called an epigastric hernia. At the belly button. This is called an umbilical hernia. At the incision site from previous abdominal surgery. This is called an incisional hernia. You may have this procedure as emergency surgery if part of your intestine gets trapped inside the hernia and starts to lose its blood supply (strangulation). Laparoscopic surgery is done through small incisions using a thin surgical telescope with a light and camera (laparoscope). During surgery, your surgeon will use images from the laparoscope to guide the procedure. A mesh screen will be placed in the hernia to close the opening and strengthen the abdominal wall. Tell a health care provider about: Any allergies you have. All medicines you are taking, including vitamins, herbs, eye drops, creams, and over-the-counter medicines. Any problems you or family members have had with anesthetic medicines. Any blood disorders you have. Any surgeries you have had. Any medical conditions you have. Whether you are pregnant or may be pregnant. What are the risks? Generally, this is a safe procedure. However, problems may occur, including: Infection. Bleeding. Damage to nearby structures or organs in the abdomen. Trouble urinating or having a bowel movement after surgery. Blood clots. The hernia coming back after surgery. Fluid buildup in the area of the hernia. In some cases,  your health care provider may need to switch from a laparoscopic procedure to a procedure that is done through a single, larger incision in the abdomen (open procedure). You may need an open procedure if: You have a hernia that is difficult to repair. Your organs are hard to see with the laparoscope. You have bleeding problems during the laparoscopic procedure. What happens before the procedure? Staying hydrated Follow instructions from your health care provider about hydration, which may include: Up to 2 hours before the procedure - you may continue to drink clear liquids, such as water, clear fruit juice, black coffee, and plain tea.  Eating and drinking restrictions Follow instructions from your health care provider about eating and drinking, which may include: 8 hours before the procedure - stop eating heavy meals or foods, such as meat, fried foods, or fatty foods. 6 hours before the procedure - stop eating light meals or foods, such as toast or cereal. 6 hours before the procedure - stop drinking milk or drinks that contain milk. 2 hours before the procedure - stop drinking clear liquids. Medicines Ask your health care provider about: Changing or stopping your regular medicines. This is especially important if you are taking diabetes medicines or blood thinners. Taking medicines such as aspirin and ibuprofen. These medicines can thin your blood. Do not take these medicines unless your health care provider tells you to take them. Taking over-the-counter medicines, vitamins, herbs, and supplements. Tests You may need to have tests before the procedure, such as: Blood tests. Urine tests. Abdominal ultrasound. Chest X-ray. Electrocardiogram (ECG). General instructions You may be asked to take a laxative or do an enema to   empty your bowel before surgery (bowel prep). Do not use any products that contain nicotine or tobacco for at least 4 weeks before the procedure. These products  include cigarettes, chewing tobacco, and vaping devices, such as e-cigarettes. If you need help quitting, ask your health care provider. Ask your health care provider: How your surgery site will be marked. What steps will be taken to help prevent infection. These steps may include: Removing hair at the surgery site. Washing skin with a germ-killing soap. Receiving antibiotic medicine. Plan to have a responsible adult take you home from the hospital or clinic. If you will be going home right after the procedure, plan to have a responsible adult care for you for the time you are told. This is important. What happens during the procedure?  An IV will be inserted into one of your veins. You will be given one or more of the following: A medicine to help you relax (sedative). A medicine to numb the area (local anesthetic). A medicine to make you fall asleep (general anesthetic). A small incision will be made in your abdomen. A hollow metal tube (trocar) will be placed through the incision. A tube will be placed through the trocar to inflate your abdomen with carbon dioxide. This makes it easier for your surgeon to see inside your abdomen during the repair. A laparoscope will be inserted into your abdomen through the trocar. The laparoscope will send images to a monitor in the operating room. Other trocars will be put through other small incisions in your abdomen. The surgical instruments needed for the procedure will be placed through these trocars. The tissue or intestines that make up the hernia will be moved back into place. The edges of the hernia may be stitched (sutured) together. A piece of mesh will be used to close the hernia. Sutures, clips, or staples will be used to keep the mesh in place. A bandage (dressing) or skin glue will be put over the incisions. The procedure may vary among health care providers and hospitals. What happens after the procedure? Your blood pressure, heart rate,  breathing rate, and blood oxygen level will be monitored until you leave the hospital or clinic. You will continue to receive fluids and medicines through an IV. Your IV will be removed when you can drink clear fluids. You will be given pain medicine as needed. You will be encouraged to get up and walk around as soon as possible. You will be shown how to do deep breathing exercises to help prevent a lung infection. If you were given a sedative during the procedure, it can affect you for several hours. Do not drive or operate machinery until your health care provider says that it is safe. Summary Laparoscopic ventral hernia is an operation to fix a hernia using small incisions. Tell your health care provider about other medical conditions that you have and about all the medicines that you are taking. Follow instructions from your health care provider about eating and drinking before the procedure. Plan to have a responsible adult take you home from the hospital or clinic. After the procedure, you will be encouraged to walk as soon as possible. You will also be taught how to do deep breathing exercises. This information is not intended to replace advice given to you by your health care provider. Make sure you discuss any questions you have with your health care provider. Document Revised: 01/11/2020 Document Reviewed: 01/11/2020 Elsevier Patient Education  2023 [employer]  

## 2022-10-25 NOTE — Telephone Encounter (Signed)
Faxed medical clearance to Dr. Laura Berglund at [phone]. 

## 2022-10-26 ENCOUNTER — Telehealth: Payer: Self-pay | Admitting: Surgery

## 2022-10-26 NOTE — Telephone Encounter (Signed)
Left message for patient to call, please inform her of the following regarding scheduled surgery with Dr. Aleen Campi.   Pre-Admission date/time, and Surgery date at [facility].  Surgery Date: 11/25/22 Preadmission Testing Date: 11/17/22 (phone 8a-1p)  Also patient will need to call at [phone], between 1-3:00pm the day before surgery, to find out what time to arrive for surgery.

## 2022-10-26 NOTE — Telephone Encounter (Signed)
Called again, patient is now aware of all dates regarding surgery.

## 2022-11-02 NOTE — Progress Notes (Unsigned)
Medical Clearance has been received from Dr Judithann Graves. The patient is cleared at low risk for surgery.

## 2022-11-17 ENCOUNTER — Inpatient Hospital Stay
Admission: RE | Admit: 2022-11-17 | Discharge: 2022-11-17 | Disposition: A | Discharge status: Home / Self Care | Payer: Medicare Other | Source: Ambulatory Visit

## 2022-11-17 NOTE — Pre-Procedure Instructions (Signed)
Called patient this am for her scheduled PAT appointment, she voiced that she arrived home late last night from being out of town and ask if I could call back at 12 pm. I attempted many calls with no answer, she returned my call at 2:45 pm and voice that she did not have time to complete her call today because she had to report to work. Her PAT appointment is rescheduled for tomorrow between 1 - 4 pm, she agrees with this time and date.

## 2022-11-17 NOTE — Patient Instructions (Signed)
Your procedure is scheduled on: 11/25/22 - Thursday Report to the Registration Desk on the 1st floor of the [facility]. To find out your arrival time, please call [phone] between 1PM - 3PM on: 11/24/22 - Wednesday If your arrival time is 6:00 am, do not arrive before that time as the [facility] entrance doors do not open until 6:00 am.  REMEMBER: Instructions that are not followed completely may result in serious medical risk, up to and including death; or upon the discretion of your surgeon and anesthesiologist your surgery may need to be rescheduled.  Do not eat food after midnight the night before surgery.  No gum chewing or hard candies.  You may however, drink CLEAR liquids up to 2 hours before you are scheduled to arrive for your surgery. Do not drink anything within 2 hours of your scheduled arrival time.  Clear liquids include: - water  - apple juice without pulp - gatorade (not RED colors) - black coffee or tea (Do NOT add milk or creamers to the coffee or tea) Do NOT drink anything that is not on this list.   One week prior to surgery: Stop Anti-inflammatories (NSAIDS) such as Advil, Aleve, Ibuprofen, Motrin, Naproxen, Naprosyn and Aspirin based products such as Excedrin, Goody's Powder, BC Powder.  Stop ANY OVER THE COUNTER supplements until after surgery.  You may however, continue to take Tylenol if needed for pain up until the day of surgery.  TAKE ONLY THESE MEDICATIONS THE MORNING OF SURGERY WITH A SIP OF WATER:  lansoprazole (PREVACID) - (take one the night before and one on the morning of surgery - helps to prevent nausea after surgery.) albuterol (PROVENTIL) nebulizer metaxalone (SKELAXIN)   Use inhalers albuterol (VENTOLIN HFA)  on the day of surgery and bring to the hospital.  No Alcohol for 24 hours before or after surgery.  No Smoking including e-cigarettes for 24 hours before surgery.  No chewable tobacco products for at least 6 hours before  surgery.  No nicotine patches on the day of surgery.  Do not use any "recreational" drugs for at least a week (preferably 2 weeks) before your surgery.  Please be advised that the combination of cocaine and anesthesia may have negative outcomes, up to and including death. If you test positive for cocaine, your surgery will be cancelled.  On the morning of surgery brush your teeth with toothpaste and water, you may rinse your mouth with mouthwash if you wish. Do not swallow any toothpaste or mouthwash.  Use CHG Soap or wipes as directed on instruction sheet.  Do not wear jewelry, make-up, hairpins, clips or nail polish.  Do not wear lotions, powders, or perfumes.   Do not shave body hair from the neck down 48 hours before surgery.  Contact lenses, hearing aids and dentures may not be worn into surgery.  Do not bring valuables to the hospital. [facility] is not responsible for any missing/lost belongings or valuables.   Notify your doctor if there is any change in your medical condition (cold, fever, infection).  Wear comfortable clothing (specific to your surgery type) to the hospital.  After surgery, you can help prevent lung complications by doing breathing exercises.  Take deep breaths and cough every 1-2 hours. Your doctor may order a device called an Incentive Spirometer to help you take deep breaths. When coughing or sneezing, hold a pillow firmly against your incision with both hands. This is called "splinting." Doing this helps protect your incision. It also  decreases belly discomfort.  If you are being admitted to the hospital overnight, leave your suitcase in the car. After surgery it may be brought to your room.  In case of increased patient census, it may be necessary for you, the patient, to continue your postoperative care in the Same Day Surgery department.  If you are being discharged the day of surgery, you will not be allowed to drive home. You will need a  responsible individual to drive you home and stay with you for 24 hours after surgery.   If you are taking public transportation, you will need to have a responsible individual with you.  Please call the Pre-admissions Testing Dept. at [phone] if you have any questions about these instructions.  Surgery Visitation Policy:  Patients having surgery or a procedure may have two visitors.  Children under the age of 32 must have an adult with them who is not the patient.  Inpatient Visitation:    Visiting hours are 7 a.m. to 8 p.m. Up to four visitors are allowed at one time in a patient room. The visitors may rotate out with other people during the day.  One visitor age 76 or older may stay with the patient overnight and must be in the room by 8 p.m.    Preparing for Surgery with CHLORHEXIDINE GLUCONATE (CHG) Soap  Chlorhexidine Gluconate (CHG) Soap  o An antiseptic cleaner that kills germs and bonds with the skin to continue killing germs even after washing  o Used for showering the night before surgery and morning of surgery  Before surgery, you can play an important role by reducing the number of germs on your skin.  CHG (Chlorhexidine gluconate) soap is an antiseptic cleanser which kills germs and bonds with the skin to continue killing germs even after washing.  Please do not use if you have an allergy to CHG or antibacterial soaps. If your skin becomes reddened/irritated stop using the CHG.  1. Shower the NIGHT BEFORE SURGERY and the MORNING OF SURGERY with CHG soap.  2. If you choose to wash your hair, wash your hair first as usual with your normal shampoo.  3. After shampooing, rinse your hair and body thoroughly to remove the shampoo.  4. Use CHG as you would any other liquid soap. You can apply CHG directly to the skin and wash gently with a scrungie or a clean washcloth.  5. Apply the CHG soap to your body only from the neck down. Do not use on open wounds or open  sores. Avoid contact with your eyes, ears, mouth, and genitals (private parts). Wash face and genitals (private parts) with your normal soap.  6. Wash thoroughly, paying special attention to the area where your surgery will be performed.  7. Thoroughly rinse your body with warm water.  8. Do not shower/wash with your normal soap after using and rinsing off the CHG soap.  9. Pat yourself dry with a clean towel.  10. Wear clean pajamas to bed the night before surgery.  12. Place clean sheets on your bed the night of your first shower and do not sleep with pets.  13. Shower again with the CHG soap on the day of surgery prior to arriving at the hospital.  14. Do not apply any deodorants/lotions/powders.  15. Please wear clean clothes to the hospital.

## 2022-11-18 ENCOUNTER — Other Ambulatory Visit: Payer: Self-pay

## 2022-11-18 ENCOUNTER — Encounter
Admission: RE | Admit: 2022-11-18 | Discharge: 2022-11-18 | Disposition: A | Discharge status: Home / Self Care | Payer: Medicare Other | Source: Ambulatory Visit | Attending: Surgery | Admitting: Surgery

## 2022-11-18 DIAGNOSIS — I10 Essential (primary) hypertension: Secondary | ICD-10-CM

## 2022-11-18 HISTORY — DX: Other complications of anesthesia, initial encounter: T88.59XA

## 2022-11-18 HISTORY — DX: Urge incontinence: N39.41

## 2022-11-18 HISTORY — DX: Pneumonia, unspecified organism: J18.9

## 2022-11-18 HISTORY — DX: Primary insomnia: F51.01

## 2022-11-18 HISTORY — DX: Anxiety disorder, unspecified: F41.9

## 2022-11-18 HISTORY — DX: Reserved for inherently not codable concepts without codable children: IMO0001

## 2022-11-18 HISTORY — DX: Personal history of cervical dysplasia: Z87.410

## 2022-11-18 HISTORY — DX: Depression, unspecified: F32.A

## 2022-11-18 HISTORY — DX: Diffuse cystic mastopathy of unspecified breast: N60.19

## 2022-11-18 HISTORY — DX: Rosacea, unspecified: L71.9

## 2022-11-18 HISTORY — DX: Polyp of colon: K63.5

## 2022-11-18 HISTORY — DX: Procedure and treatment not carried out because of patient's decision for reasons of belief and group pressure: Z53.1

## 2022-11-18 NOTE — Patient Instructions (Addendum)
Your procedure is scheduled on: 11/25/22 -Thursday Report to the Registration Desk on the 1st floor of the [facility]. To find out your arrival time, please call [phone] between 1PM - 3PM on: 11/24/22 - Wednesday If your arrival time is 6:00 am, do not arrive before that time as the [facility] entrance doors do not open until 6:00 am.  REMEMBER: Instructions that are not followed completely may result in serious medical risk, up to and including death; or upon the discretion of your surgeon and anesthesiologist your surgery may need to be rescheduled.  Do not eat food after midnight the night before surgery.  No gum chewing or hard candies.  You may however, drink CLEAR liquids up to 2 hours before you are scheduled to arrive for your surgery. Do not drink anything within 2 hours of your scheduled arrival time.  Clear liquids include: - water  - apple juice without pulp - gatorade (not RED colors) - black coffee or tea (Do NOT add milk or creamers to the coffee or tea) Do NOT drink anything that is not on this list.   One week prior to surgery:  Stop taking all Anti-inflammatories (NSAIDS) such as Advil, Aleve, Ibuprofen, Motrin, Naproxen, Naprosyn and Aspirin based products such as Excedrin, Goody's Powder, BC Powder.  Stop ANY OVER THE COUNTER supplements until after surgery.  You may however, continue to take Tylenol if needed for pain up until the day of surgery.  Continue taking all prescribed medications with the exception of the following:   diclofenac (VOLTAREN) tablet  diclofenac (VOLTAREN) gel  TAKE ONLY THESE MEDICATIONS THE MORNING OF SURGERY WITH A SIP OF WATER:  lansoprazole (PREVACID) - (take one the night before and one on the morning of surgery - helps to prevent nausea after surgery.) oxyCODONE-acetaminophen (PERCOCET)   Use albuterol (VENTOLIN HFA)  on the day of surgery and bring to the hospital.   No Alcohol for 24 hours before or after  surgery.  No Smoking including e-cigarettes for 24 hours before surgery.  No chewable tobacco products for at least 6 hours before surgery.  No nicotine patches on the day of surgery.  Do not use any "recreational" drugs for at least a week (preferably 2 weeks) before your surgery.  Please be advised that the combination of cocaine and anesthesia may have negative outcomes, up to and including death. If you test positive for cocaine, your surgery will be cancelled.  On the morning of surgery brush your teeth with toothpaste and water, you may rinse your mouth with mouthwash if you wish. Do not swallow any toothpaste or mouthwash.  Use CHG Soap or wipes as directed on instruction sheet.  Do not wear jewelry, make-up, hairpins, clips or nail polish.  Do not wear lotions, powders, or perfumes.   Do not shave body hair from the neck down 48 hours before surgery.  Contact lenses, hearing aids and dentures may not be worn into surgery.  Do not bring valuables to the hospital. [facility] is not responsible for any missing/lost belongings or valuables.    Notify your doctor if there is any change in your medical condition (cold, fever, infection).  Wear comfortable clothing (specific to your surgery type) to the hospital.  After surgery, you can help prevent lung complications by doing breathing exercises.  Take deep breaths and cough every 1-2 hours. Your doctor may order a device called an Incentive Spirometer to help you take deep breaths. When coughing or sneezing, hold  a pillow firmly against your incision with both hands. This is called "splinting." Doing this helps protect your incision. It also decreases belly discomfort.  If you are being admitted to the hospital overnight, leave your suitcase in the car. After surgery it may be brought to your room.  In case of increased patient census, it may be necessary for you, the patient, to continue your postoperative care in the Same Day  Surgery department.  If you are being discharged the day of surgery, you will not be allowed to drive home. You will need a responsible individual to drive you home and stay with you for 24 hours after surgery.   If you are taking public transportation, you will need to have a responsible individual with you.  Please call the Pre-admissions Testing Dept. at [phone] if you have any questions about these instructions.  Surgery Visitation Policy:  Patients having surgery or a procedure may have two visitors.  Children under the age of 74 must have an adult with them who is not the patient.  Inpatient Visitation:    Visiting hours are 7 a.m. to 8 p.m. Up to four visitors are allowed at one time in a patient room. The visitors may rotate out with other people during the day.  One visitor age 82 or older may stay with the patient overnight and must be in the room by 8 p.m.    Preparing for Surgery with CHLORHEXIDINE GLUCONATE (CHG) Soap  Chlorhexidine Gluconate (CHG) Soap  o An antiseptic cleaner that kills germs and bonds with the skin to continue killing germs even after washing  o Used for showering the night before surgery and morning of surgery  Before surgery, you can play an important role by reducing the number of germs on your skin.  CHG (Chlorhexidine gluconate) soap is an antiseptic cleanser which kills germs and bonds with the skin to continue killing germs even after washing.  Please do not use if you have an allergy to CHG or antibacterial soaps. If your skin becomes reddened/irritated stop using the CHG.  1. Shower the NIGHT BEFORE SURGERY and the MORNING OF SURGERY with CHG soap.  2. If you choose to wash your hair, wash your hair first as usual with your normal shampoo.  3. After shampooing, rinse your hair and body thoroughly to remove the shampoo.  4. Use CHG as you would any other liquid soap. You can apply CHG directly to the skin and wash gently with a  scrungie or a clean washcloth.  5. Apply the CHG soap to your body only from the neck down. Do not use on open wounds or open sores. Avoid contact with your eyes, ears, mouth, and genitals (private parts). Wash face and genitals (private parts) with your normal soap.  6. Wash thoroughly, paying special attention to the area where your surgery will be performed.  7. Thoroughly rinse your body with warm water.  8. Do not shower/wash with your normal soap after using and rinsing off the CHG soap.  9. Pat yourself dry with a clean towel.  10. Wear clean pajamas to bed the night before surgery.  12. Place clean sheets on your bed the night of your first shower and do not sleep with pets.  13. Shower again with the CHG soap on the day of surgery prior to arriving at the hospital.  14. Do not apply any deodorants/lotions/powders.  15. Please wear clean clothes to the hospital.

## 2022-11-19 DIAGNOSIS — M47896 Other spondylosis, lumbar region: Secondary | ICD-10-CM | POA: Diagnosis not present

## 2022-11-19 DIAGNOSIS — G894 Chronic pain syndrome: Secondary | ICD-10-CM | POA: Diagnosis not present

## 2022-11-19 DIAGNOSIS — M170 Bilateral primary osteoarthritis of knee: Secondary | ICD-10-CM | POA: Diagnosis not present

## 2022-11-19 DIAGNOSIS — M797 Fibromyalgia: Secondary | ICD-10-CM | POA: Diagnosis not present

## 2022-11-22 ENCOUNTER — Encounter
Admission: RE | Admit: 2022-11-22 | Discharge: 2022-11-22 | Disposition: A | Discharge status: Home / Self Care | Payer: Medicare Other | Source: Ambulatory Visit | Attending: Surgery | Admitting: Surgery

## 2022-11-22 DIAGNOSIS — Z01810 Encounter for preprocedural cardiovascular examination: Secondary | ICD-10-CM | POA: Diagnosis not present

## 2022-11-22 DIAGNOSIS — I10 Essential (primary) hypertension: Secondary | ICD-10-CM | POA: Diagnosis not present

## 2022-11-24 MED ORDER — CHLORHEXIDINE GLUCONATE CLOTH 2 % EX PADS: 6.0000 | MEDICATED_PAD | Freq: Once | CUTANEOUS | Status: DC

## 2022-11-24 MED ORDER — LACTATED RINGERS IV SOLN: INTRAVENOUS | Status: DC

## 2022-11-24 MED ORDER — ACETAMINOPHEN 500 MG PO TABS
1000.0000 mg | ORAL_TABLET | ORAL | Status: AC
  Administered 2022-11-25: 1000 mg via ORAL

## 2022-11-24 MED ORDER — GABAPENTIN 300 MG PO CAPS: 300.0000 mg | ORAL_CAPSULE | ORAL | Status: DC

## 2022-11-24 MED ORDER — CEFAZOLIN SODIUM-DEXTROSE 2-4 GM/100ML-% IV SOLN
2.0000 g | INTRAVENOUS | Status: AC
  Administered 2022-11-25: 2 g via INTRAVENOUS

## 2022-11-24 MED ORDER — CHLORHEXIDINE GLUCONATE 0.12 % MT SOLN
15.0000 mL | Freq: Once | OROMUCOSAL | Status: AC
  Administered 2022-11-25: 15 mL via OROMUCOSAL

## 2022-11-24 MED ORDER — BUPIVACAINE LIPOSOME 1.3 % IJ SUSP: 20.0000 mL | Freq: Once | INTRAMUSCULAR | Status: DC

## 2022-11-24 MED ORDER — ORAL CARE MOUTH RINSE: 15.0000 mL | Freq: Once | OROMUCOSAL | Status: AC

## 2022-11-25 ENCOUNTER — Other Ambulatory Visit: Payer: Self-pay

## 2022-11-25 ENCOUNTER — Ambulatory Visit: Payer: Medicare Other | Admitting: Urgent Care

## 2022-11-25 ENCOUNTER — Encounter
Admission: RE | Disposition: A | Discharge status: Home / Self Care | Payer: Self-pay | Source: Home / Self Care | Attending: Surgery

## 2022-11-25 ENCOUNTER — Ambulatory Visit
Admission: RE | Admit: 2022-11-25 | Discharge: 2022-11-25 | Disposition: A | Discharge status: Home / Self Care | Payer: Medicare Other | Attending: Surgery | Admitting: Surgery

## 2022-11-25 ENCOUNTER — Encounter: Payer: Self-pay | Admitting: Surgery

## 2022-11-25 DIAGNOSIS — K429 Umbilical hernia without obstruction or gangrene: Secondary | ICD-10-CM | POA: Insufficient documentation

## 2022-11-25 DIAGNOSIS — M1990 Unspecified osteoarthritis, unspecified site: Secondary | ICD-10-CM | POA: Diagnosis not present

## 2022-11-25 DIAGNOSIS — K436 Other and unspecified ventral hernia with obstruction, without gangrene: Secondary | ICD-10-CM | POA: Diagnosis not present

## 2022-11-25 DIAGNOSIS — K439 Ventral hernia without obstruction or gangrene: Secondary | ICD-10-CM | POA: Diagnosis not present

## 2022-11-25 DIAGNOSIS — K219 Gastro-esophageal reflux disease without esophagitis: Secondary | ICD-10-CM | POA: Insufficient documentation

## 2022-11-25 DIAGNOSIS — Z79899 Other long term (current) drug therapy: Secondary | ICD-10-CM | POA: Insufficient documentation

## 2022-11-25 DIAGNOSIS — F419 Anxiety disorder, unspecified: Secondary | ICD-10-CM | POA: Diagnosis not present

## 2022-11-25 DIAGNOSIS — J45909 Unspecified asthma, uncomplicated: Secondary | ICD-10-CM | POA: Diagnosis not present

## 2022-11-25 DIAGNOSIS — I10 Essential (primary) hypertension: Secondary | ICD-10-CM | POA: Diagnosis not present

## 2022-11-25 DIAGNOSIS — F32A Depression, unspecified: Secondary | ICD-10-CM | POA: Diagnosis not present

## 2022-11-25 DIAGNOSIS — Z87891 Personal history of nicotine dependence: Secondary | ICD-10-CM | POA: Insufficient documentation

## 2022-11-25 DIAGNOSIS — I700 Atherosclerosis of aorta: Secondary | ICD-10-CM | POA: Insufficient documentation

## 2022-11-25 DIAGNOSIS — M797 Fibromyalgia: Secondary | ICD-10-CM | POA: Diagnosis not present

## 2022-11-25 HISTORY — PX: INSERTION OF MESH: SHX5868

## 2022-11-25 HISTORY — PX: XI ROBOTIC ASSISTED VENTRAL HERNIA: SHX6789

## 2022-11-25 SURGERY — REPAIR, HERNIA, VENTRAL, ROBOT-ASSISTED
Anesthesia: General | Site: Abdomen

## 2022-11-25 MED ORDER — FENTANYL CITRATE (PF) 100 MCG/2ML IJ SOLN
INTRAMUSCULAR | Status: AC
  Filled 2022-11-25: qty 2

## 2022-11-25 MED ORDER — ACETAMINOPHEN 10 MG/ML IV SOLN: 1000.0000 mg | Freq: Once | INTRAVENOUS | Status: DC | PRN

## 2022-11-25 MED ORDER — HYDROMORPHONE HCL 1 MG/ML IJ SOLN
INTRAMUSCULAR | Status: AC
  Filled 2022-11-25: qty 1

## 2022-11-25 MED ORDER — CEFAZOLIN SODIUM-DEXTROSE 2-4 GM/100ML-% IV SOLN
INTRAVENOUS | Status: AC
  Filled 2022-11-25: qty 100

## 2022-11-25 MED ORDER — MIDAZOLAM HCL 2 MG/2ML IJ SOLN
INTRAMUSCULAR | Status: AC
  Filled 2022-11-25: qty 2

## 2022-11-25 MED ORDER — SODIUM CHLORIDE FLUSH 0.9 % IV SOLN
INTRAVENOUS | Status: AC
  Filled 2022-11-25: qty 10

## 2022-11-25 MED ORDER — IBUPROFEN 800 MG PO TABS: 800.0000 mg | ORAL_TABLET | Freq: Three times a day (TID) | ORAL | 1 refills | Status: DC | PRN

## 2022-11-25 MED ORDER — ONDANSETRON HCL 4 MG/2ML IJ SOLN
INTRAMUSCULAR | Status: DC | PRN
  Administered 2022-11-25 (×2): 4 mg via INTRAVENOUS

## 2022-11-25 MED ORDER — BUPIVACAINE LIPOSOME 1.3 % IJ SUSP
INTRAMUSCULAR | Status: AC
  Filled 2022-11-25: qty 20

## 2022-11-25 MED ORDER — CHLORHEXIDINE GLUCONATE 0.12 % MT SOLN
OROMUCOSAL | Status: AC
  Filled 2022-11-25: qty 15

## 2022-11-25 MED ORDER — FENTANYL CITRATE (PF) 100 MCG/2ML IJ SOLN
INTRAMUSCULAR | Status: DC | PRN
  Administered 2022-11-25: 100 ug via INTRAVENOUS

## 2022-11-25 MED ORDER — 0.9 % SODIUM CHLORIDE (POUR BTL) OPTIME
TOPICAL | Status: DC | PRN
  Administered 2022-11-25: 500 mL

## 2022-11-25 MED ORDER — PROPOFOL 10 MG/ML IV BOLUS
INTRAVENOUS | Status: DC | PRN
  Administered 2022-11-25: 200 mg via INTRAVENOUS

## 2022-11-25 MED ORDER — DEXAMETHASONE SODIUM PHOSPHATE 10 MG/ML IJ SOLN
INTRAMUSCULAR | Status: DC | PRN
  Administered 2022-11-25: 10 mg via INTRAVENOUS

## 2022-11-25 MED ORDER — ACETAMINOPHEN 500 MG PO TABS
ORAL_TABLET | ORAL | Status: AC
  Filled 2022-11-25: qty 2

## 2022-11-25 MED ORDER — OXYCODONE HCL 5 MG PO TABS: 5.0000 mg | ORAL_TABLET | ORAL | 0 refills | Status: DC | PRN

## 2022-11-25 MED ORDER — PHENYLEPHRINE HCL-NACL 20-0.9 MG/250ML-% IV SOLN
INTRAVENOUS | Status: DC | PRN
  Administered 2022-11-25: 20 ug/min via INTRAVENOUS

## 2022-11-25 MED ORDER — FENTANYL CITRATE (PF) 100 MCG/2ML IJ SOLN
25.0000 ug | INTRAMUSCULAR | Status: DC | PRN
  Administered 2022-11-25 (×3): 50 ug via INTRAVENOUS

## 2022-11-25 MED ORDER — HYDROMORPHONE HCL 1 MG/ML IJ SOLN
1.0000 mg | INTRAMUSCULAR | Status: DC | PRN
  Administered 2022-11-25 (×2): 1 mg via INTRAVENOUS

## 2022-11-25 MED ORDER — PROPOFOL 1000 MG/100ML IV EMUL
INTRAVENOUS | Status: AC
  Filled 2022-11-25: qty 100

## 2022-11-25 MED ORDER — GLYCOPYRROLATE 0.2 MG/ML IJ SOLN
INTRAMUSCULAR | Status: DC | PRN
  Administered 2022-11-25: .2 mg via INTRAVENOUS

## 2022-11-25 MED ORDER — ONDANSETRON HCL 4 MG/2ML IJ SOLN: 4.0000 mg | Freq: Once | INTRAMUSCULAR | Status: DC | PRN

## 2022-11-25 MED ORDER — EPINEPHRINE PF 1 MG/ML IJ SOLN
INTRAMUSCULAR | Status: AC
  Filled 2022-11-25: qty 1

## 2022-11-25 MED ORDER — HYDROMORPHONE HCL 1 MG/ML IJ SOLN
INTRAMUSCULAR | Status: DC | PRN
  Administered 2022-11-25 (×2): 1 mg via INTRAVENOUS

## 2022-11-25 MED ORDER — ACETAMINOPHEN 500 MG PO TABS: 1000.0000 mg | ORAL_TABLET | Freq: Four times a day (QID) | ORAL | Status: AC | PRN

## 2022-11-25 MED ORDER — DEXMEDETOMIDINE HCL IN NACL 200 MCG/50ML IV SOLN
INTRAVENOUS | Status: DC | PRN
  Administered 2022-11-25: 8 ug via INTRAVENOUS
  Administered 2022-11-25: 12 ug via INTRAVENOUS

## 2022-11-25 MED ORDER — ROCURONIUM BROMIDE 100 MG/10ML IV SOLN
INTRAVENOUS | Status: DC | PRN
  Administered 2022-11-25: 30 mg via INTRAVENOUS
  Administered 2022-11-25: 50 mg via INTRAVENOUS

## 2022-11-25 MED ORDER — GABAPENTIN 300 MG PO CAPS
ORAL_CAPSULE | ORAL | Status: AC
  Filled 2022-11-25: qty 1

## 2022-11-25 MED ORDER — SUGAMMADEX SODIUM 200 MG/2ML IV SOLN
INTRAVENOUS | Status: DC | PRN
  Administered 2022-11-25: 200 mg via INTRAVENOUS

## 2022-11-25 MED ORDER — SODIUM CHLORIDE (PF) 0.9 % IJ SOLN
INTRAMUSCULAR | Status: DC | PRN
  Administered 2022-11-25: 70 mL via INTRAMUSCULAR

## 2022-11-25 MED ORDER — LIDOCAINE HCL (CARDIAC) PF 100 MG/5ML IV SOSY
PREFILLED_SYRINGE | INTRAVENOUS | Status: DC | PRN
  Administered 2022-11-25: 100 mg via INTRAVENOUS

## 2022-11-25 MED ORDER — BUPIVACAINE HCL (PF) 0.5 % IJ SOLN
INTRAMUSCULAR | Status: AC
  Filled 2022-11-25: qty 30

## 2022-11-25 MED ORDER — MIDAZOLAM HCL 2 MG/2ML IJ SOLN
INTRAMUSCULAR | Status: DC | PRN
  Administered 2022-11-25: 2 mg via INTRAVENOUS

## 2022-11-25 SURGICAL SUPPLY — 60 items
ADH SKN CLS APL DERMABOND .7 (GAUZE/BANDAGES/DRESSINGS) ×1
BLADE SURG SZ11 CARB STEEL (BLADE) ×1 IMPLANT
CANNULA CAP OBTURATR AIRSEAL 8 (CAP) IMPLANT
COVER TIP SHEARS 8 DVNC (MISCELLANEOUS) ×1 IMPLANT
COVER WAND RF STERILE (DRAPES) ×1 IMPLANT
DERMABOND ADVANCED .7 DNX12 (GAUZE/BANDAGES/DRESSINGS) ×1 IMPLANT
DRAPE ARM DVNC X/XI (DISPOSABLE) ×3 IMPLANT
DRAPE COLUMN DVNC XI (DISPOSABLE) ×1 IMPLANT
ELECT CAUTERY BLADE TIP 2.5 (TIP) ×1
ELECT REM PT RETURN 9FT ADLT (ELECTROSURGICAL) ×1
ELECTRODE CAUTERY BLDE TIP 2.5 (TIP) ×1 IMPLANT
ELECTRODE REM PT RTRN 9FT ADLT (ELECTROSURGICAL) ×1 IMPLANT
FORCEPS BPLR R/ABLATION 8 DVNC (INSTRUMENTS) ×1 IMPLANT
GLOVE SURG SYN 7.0 (GLOVE) ×2 IMPLANT
GLOVE SURG SYN 7.0 PF PI (GLOVE) ×2 IMPLANT
GLOVE SURG SYN 7.5 E (GLOVE) ×2 IMPLANT
GLOVE SURG SYN 7.5 PF PI (GLOVE) ×2 IMPLANT
GOWN STRL REUS W/ TWL LRG LVL3 (GOWN DISPOSABLE) ×3 IMPLANT
GOWN STRL REUS W/TWL LRG LVL3 (GOWN DISPOSABLE) ×3
GRASPER SUT TROCAR 14GX15 (MISCELLANEOUS) ×1 IMPLANT
IRRIGATION STRYKERFLOW (MISCELLANEOUS) IMPLANT
IRRIGATOR STRYKERFLOW (MISCELLANEOUS)
IV NS 1000ML (IV SOLUTION)
IV NS 1000ML BAXH (IV SOLUTION) IMPLANT
KIT PINK PAD W/HEAD ARE REST (MISCELLANEOUS) ×1
KIT PINK PAD W/HEAD ARM REST (MISCELLANEOUS) ×1 IMPLANT
LABEL OR SOLS (LABEL) ×1 IMPLANT
MANIFOLD NEPTUNE II (INSTRUMENTS) ×1 IMPLANT
MESH VENT LT ST 15CM CRL ECHO2 (Mesh General) IMPLANT
NDL DRIVE SUT CUT DVNC (INSTRUMENTS) ×1 IMPLANT
NDL HYPO 22X1.5 SAFETY MO (MISCELLANEOUS) ×1 IMPLANT
NDL INSUFFLATION 14GA 120MM (NEEDLE) ×1 IMPLANT
NEEDLE DRIVE SUT CUT DVNC (INSTRUMENTS) ×1 IMPLANT
NEEDLE HYPO 22X1.5 SAFETY MO (MISCELLANEOUS) ×1 IMPLANT
NEEDLE INSUFFLATION 14GA 120MM (NEEDLE) ×1 IMPLANT
OBTURATOR OPTICAL STND 8 DVNC (TROCAR) ×1
OBTURATOR OPTICALSTD 8 DVNC (TROCAR) ×1 IMPLANT
PACK LAP CHOLECYSTECTOMY (MISCELLANEOUS) ×1 IMPLANT
PENCIL SMOKE EVACUATOR (MISCELLANEOUS) ×1 IMPLANT
SCISSORS MNPLR CVD DVNC XI (INSTRUMENTS) ×1 IMPLANT
SEAL UNIV 5-12 XI (MISCELLANEOUS) ×2 IMPLANT
SET TUBE FILTERED XL AIRSEAL (SET/KITS/TRAYS/PACK) IMPLANT
SET TUBE SMOKE EVAC HIGH FLOW (TUBING) ×1 IMPLANT
SOL ELECTROSURG ANTI STICK (MISCELLANEOUS) ×1
SOLUTION ELECTROSURG ANTI STCK (MISCELLANEOUS) ×1 IMPLANT
SPONGE T-LAP 18X18 ~~LOC~~+RFID (SPONGE) ×1 IMPLANT
SUT MNCRL 4-0 (SUTURE) ×2
SUT MNCRL 4-0 27XMFL (SUTURE) ×2
SUT STRATAFIX PDS 30 CT-1 (SUTURE) ×1 IMPLANT
SUT VIC AB 3-0 SH 27 (SUTURE) ×1
SUT VIC AB 3-0 SH 27X BRD (SUTURE) IMPLANT
SUT VICRYL 0 UR6 27IN ABS (SUTURE) ×2 IMPLANT
SUT VLOC 90 2/L VL 12 GS22 (SUTURE) ×2 IMPLANT
SUTURE MNCRL 4-0 27XMF (SUTURE) ×1 IMPLANT
SYS BAG RETRIEVAL 10MM (BASKET) ×1
SYSTEM BAG RETRIEVAL 10MM (BASKET) IMPLANT
TAPE TRANSPORE STRL 2 31045 (GAUZE/BANDAGES/DRESSINGS) ×1 IMPLANT
TRAP FLUID SMOKE EVACUATOR (MISCELLANEOUS) ×1 IMPLANT
TRAY FOLEY SLVR 16FR LF STAT (SET/KITS/TRAYS/PACK) ×1 IMPLANT
WATER STERILE IRR 500ML POUR (IV SOLUTION) ×1 IMPLANT

## 2022-11-25 NOTE — Anesthesia Preprocedure Evaluation (Signed)
Anesthesia Evaluation  Patient identified by MRN, date of birth, ID band Patient awake    Reviewed: Allergy & Precautions, NPO status , Patient's Chart, lab work & pertinent test results  History of Anesthesia Complications Negative for: history of anesthetic complications  Airway Mallampati: III  TM Distance: >3 FB Neck ROM: Full    Dental no notable dental hx. (+) Teeth Intact   Pulmonary asthma , neg sleep apnea, neg COPD, Patient abstained from smoking.Not current smoker, former smoker   Pulmonary exam normal breath sounds clear to auscultation       Cardiovascular Exercise Tolerance: Good METShypertension, Pt. on medications (-) CAD and (-) Past MI (-) dysrhythmias  Rhythm:Regular Rate:Normal - Systolic murmurs    Neuro/Psych  Headaches PSYCHIATRIC DISORDERS Anxiety Depression       GI/Hepatic ,GERD  Controlled and Medicated,,(+)     (-) substance abuse    Endo/Other  neg diabetes    Renal/GU negative Renal ROS     Musculoskeletal  (+)  Fibromyalgia -, narcotic dependentTakes around 50mg  of oxycodone a day   Abdominal   Peds  Hematology  (+) Blood dyscrasia, anemia , REFUSES BLOOD PRODUCTS  Anesthesia Other Findings Past Medical History: No date: Anemia No date: Anxiety No date: Asthma 07/2021: BRCA negative     Comment:  MyRisk neg No date: Complication of anesthesia     Comment:  woke up during cataract surgery No date: Depression No date: Family history of adverse reaction to anesthesia     Comment:  sister awareness under anesthesia No date: Family history of breast cancer No date: Fibrocystic breast changes No date: Fibromyalgia No date: GERD (gastroesophageal reflux disease) No date: Headache No date: History of cervical dysplasia No date: Hypertension No date: Idiopathic insomnia 07/2021: Increased risk of breast cancer     Comment:  IBIS=14.5%/riskscore=23.7% No date: Motion sickness      Comment:  cars, roller coasters No date: Osteoarthritis No date: Pneumonia No date: Polyp of sigmoid colon No date: Refusal of blood transfusions as patient is Jehovah's Witness No date: Rosacea No date: Urge incontinence  Reproductive/Obstetrics                             Anesthesia Physical Anesthesia Plan  ASA: 3  Anesthesia Plan: General   Post-op Pain Management: Dilaudid IV, Ketamine IV* and Tylenol PO (pre-op)*   Induction: Intravenous  PONV Risk Score and Plan: 4 or greater and Ondansetron, Dexamethasone and Midazolam  Airway Management Planned: Oral ETT and Video Laryngoscope Planned  Additional Equipment: None  Intra-op Plan:   Post-operative Plan: Extubation in OR  Informed Consent: I have reviewed the patients History and Physical, chart, labs and discussed the procedure including the risks, benefits and alternatives for the proposed anesthesia with the patient or authorized representative who has indicated his/her understanding and acceptance.     Dental advisory given  Plan Discussed with: CRNA and Surgeon  Anesthesia Plan Comments: (Discussed risks of anesthesia with patient, including PONV, sore throat, lip/dental/eye damage. Rare risks discussed as well, such as cardiorespiratory and neurological sequelae, and allergic reactions. Discussed the role of CRNA in patient's perioperative care. Patient understands. Discussed expectations for pain control in post-op.)       Anesthesia Quick Evaluation

## 2022-11-25 NOTE — H&P (Signed)
Reason for Visit: Umbilical and ventral hernias   Requesting Provider: Bari Edward, MD   History of Present Illness: Alicia Nichols is a 62 y.o. female presenting for evaluation of abdominal hernias.  She saw her PCP on 10/05/22 due to abdominal pain in the epigastric area.  She reports she's had bulging in her abdomen for a few years and reports the pain has been progressing.  Denies nausea/vomiting, but does reports constipation issues.  She has had a prior umbilical hernia repair when she was a child.  She used to do a lot of heavy lifting and strenuous activities with her work and at home. She has decreased some of this strain.  She reports the bulging always being present, but worsens when she does something more strenuous.  She initially had an U/S of her abdomen which did not show any issues, but follow up CT scan did show two small hernia defects at the umbilicus and a few cm superior to it.  The epigastric hernia does contain fatty tissue, and the sac itself is about 3 cm.  The epigastric area is the area of pain.   Past Medical History:     Past Medical History:  Diagnosis Date   Anemia     Asthma     BRCA negative 07/2021    MyRisk neg   Family history of adverse reaction to anesthesia      sister awareness under anesthesia   Family history of breast cancer     Fibromyalgia     GERD (gastroesophageal reflux disease)     Headache     Hypertension     Increased risk of breast cancer 07/2021    IBIS=14.5%/riskscore=23.7%   Motion sickness      cars, roller coasters   Osteoarthritis        Past Surgical History:      Past Surgical History:  Procedure Laterality Date   arm surgery Left 2018    reduction osteotomy   BACK SURGERY       CATARACT EXTRACTION W/PHACO Right 04/16/2019    Procedure: CATARACT EXTRACTION PHACO AND INTRAOCULAR LENS PLACEMENT (IOC) RIGHT SYMFONY LENS 00:31.0  9.5%  3.03;  Surgeon: Nevada Crane, MD;  Location: [facility];  Service:  Ophthalmology;  Laterality: Right;  Latex   CATARACT EXTRACTION W/PHACO Left 05/14/2019    Procedure: CATARACT EXTRACTION PHACO AND INTRAOCULAR LENS PLACEMENT (IOC) LEFT SYMFONY LENS, 0.25, 00:04.9;  Surgeon: Nevada Crane, MD;  Location: [facility];  Service: Ophthalmology;  Laterality: Left;   COLONOSCOPY WITH PROPOFOL N/A 12/16/2016    Procedure: COLONOSCOPY WITH PROPOFOL;  Surgeon: Midge Minium, MD;  Location: [facility];  Service: Endoscopy;  Laterality: N/A;   HAND SURGERY Left 2008    trauma surgery after dog bite   KNEE ARTHROSCOPY       LAMINECTOMY   1992   MENISCUS REPAIR Right 01/2014   POLYPECTOMY   12/16/2016    Procedure: POLYPECTOMY INTESTINAL;  Surgeon: Midge Minium, MD;  Location: [facility];  Service: Endoscopy;;   TOTAL ABDOMINAL HYSTERECTOMY   2011      Home Medications:        Prior to Admission medications   Medication Sig Start Date End Date Taking? Authorizing Provider  albuterol (PROVENTIL) (2.5 MG/3ML) 0.083% nebulizer solution Take 3 mLs (2.5 mg total) by nebulization every 6 (six) hours as needed for wheezing or shortness of breath. 01/22/22   Yes Reubin Milan, MD  albuterol (VENTOLIN  HFA) 108 (90 Base) MCG/ACT inhaler Inhale 2 puffs into the lungs every 6 (six) hours as needed for wheezing or shortness of breath. 09/29/20   Yes Reubin Milan, MD  ALPRAZolam Prudy Feeler) 0.25 MG tablet Take 1 tablet (0.25 mg total) by mouth daily as needed for anxiety. 01/22/22   Yes Reubin Milan, MD  Clocortolone Pivalate (CLODERM) 0.1 % cream Apply 1 Application topically 2 (two) times daily. 10/05/22   Yes Reubin Milan, MD  conjugated estrogens (PREMARIN) vaginal cream Insert 1 g vaginally once weekly as maintenance 10/05/22   Yes Reubin Milan, MD  diclofenac (VOLTAREN) 75 MG EC tablet Take 75 mg by mouth 2 (two) times daily as needed. 08/30/20   Yes [provider]  diclofenac Sodium (VOLTAREN) 1 % GEL Apply topically as  needed.     Yes [provider]  diphenhydrAMINE (BENADRYL) 50 MG capsule Take 50 mg by mouth every 6 (six) hours as needed.     Yes [provider]  Doxepin HCl 5 % CREA Apply topically.     Yes [provider]  estradiol (CLIMARA - DOSED IN MG/24 HR) 0.05 mg/24hr patch Place 1 patch (0.05 mg total) onto the skin once a week. 10/05/22   Yes Reubin Milan, MD  gentamicin cream (GARAMYCIN) 0.1 % Apply 1 application topically 2 (two) times daily. 06/26/21   Yes Felecia Shelling, DPM  lansoprazole (PREVACID) 30 MG capsule Take 1 capsule (30 mg total) by mouth daily at 12 noon. 12/01/21   Yes Reubin Milan, MD  lisinopril-hydrochlorothiazide (ZESTORETIC) 20-12.5 MG tablet TAKE 1 TABLET BY MOUTH EVERY DAY 08/27/22   Yes Reubin Milan, MD  metaxalone (SKELAXIN) 800 MG tablet Take 800 mg by mouth 3 (three) times daily. 06/08/21   Yes [provider]  oxyCODONE-acetaminophen (PERCOCET) 10-325 MG tablet Take 1-2 tablets by mouth every 4 (four) hours. 06/26/21   Yes [provider]  Probiotic Product (PROBIOTIC DAILY PO) Take by mouth daily.     Yes [provider]  Pseudoephedrine-APAP-DM (DAYQUIL PO) Take by mouth as needed.     Yes [provider]  SUMAtriptan (IMITREX) 100 MG tablet Take 1 tablet (100 mg total) by mouth daily as needed. 12/01/21   Yes Reubin Milan, MD      Allergies:      Allergies  Allergen Reactions   Aspirin Shortness Of Breath   Hydrocodone Shortness Of Breath   Ambien [Zolpidem]        Suicidal, also caused vision issues   Bupropion Hcl        intolerance   Divalproex Sodium Er Nausea And Vomiting   Duloxetine Hcl        intolerance   Escitalopram        intolerance   Macrobid [Nitrofurantoin]        Severe abdominal pain   Morphine Sulfate Itching   Naproxen        delusions   Ampicillin Rash   Codeine Itching and Rash          Latex Rash      Gloves - when worn for extended period    Oxybutynin Other (See Comments)      Headache   Sulfa Antibiotics Rash      Social History:  reports that she quit smoking about 36 years ago. Her smoking use included cigarettes. She has never used smokeless tobacco. She reports current alcohol use. She reports that she does not  use drugs.    Family History:      Family History  Problem Relation Age of Onset   Rheum arthritis Mother     Breast cancer Maternal Aunt          10s   Breast cancer Maternal Aunt          50s/60s   Breast cancer Maternal Grandmother          21s   Breast cancer Cousin          64s   Breast cancer Cousin          38s      Review of Systems: Review of Systems  Constitutional:  Negative for chills and fever.  HENT:  Negative for hearing loss.   Respiratory:  Negative for shortness of breath.   Cardiovascular:  Negative for chest pain.  Gastrointestinal:  Positive for abdominal pain and constipation. Negative for diarrhea, nausea and vomiting.  Genitourinary:  Negative for dysuria.  Musculoskeletal:  Positive for back pain.  Skin:  Negative for rash.  Neurological:  Negative for dizziness.  Psychiatric/Behavioral:  Negative for depression.       Physical Exam BP (!) 166/90   Pulse 65   Temp 98.7 F (37.1 C) (Oral)   Ht 5\' 6"  (1.676 m)   Wt 210 lb 6.4 oz (95.4 kg)   SpO2 98%   BMI 33.96 kg/m  CONSTITUTIONAL: No acute distress, well nourished. HEENT:  Normocephalic, atraumatic, extraocular motion intact. NECK: Trachea is midline, and there is no jugular venous distension.  RESPIRATORY:  Lungs are clear, and breath sounds are equal bilaterally. Normal respiratory effort without pathologic use of accessory muscles. CARDIOVASCULAR: Heart is regular without murmurs, gallops, or rubs. GI: The abdomen is soft, non-distended, with localized tenderness over the epigastric area at the site of her hernia.  This is incarcerated with some tenderness, but without any overlying skin changes or peritonitis  to suggest strangulation.  At the umbilicus she also has a smaller hernia, also incarcerated, but with milder symptoms only. MUSCULOSKELETAL:  Normal muscle strength and tone in all four extremities.  No peripheral edema or cyanosis. SKIN: Skin turgor is normal. There are no pathologic skin lesions.  NEUROLOGIC:  Motor and sensation is grossly normal.  Cranial nerves are grossly intact. PSYCH:  Alert and oriented to person, place and time. Affect is normal.   Laboratory Analysis: Labs from 09/30/22: Na 137, K 4.3, Cl 98, CO2 26, BUN 17, Cr 0.84.  Total bili 0.4, AST 14, ALT 12, Alk Phos 100, albumin 4.6.  WBC 4.3, Hgb 12, Hct 36.4, Plt 351.   Imaging: CT abdomen 10/14/22: IMPRESSION: 1. Small ventral midline fat-containing hernia underlying the skin marker with hernia neck measuring 1.3 x 1.0 cm. The transverse colon closely abuts the hernia. A separate, smaller fat-containing hernia is located immediately inferiorly. 2.  Aortic Atherosclerosis (ICD10-I70.0).   Assessment and Plan: This is a 62 y.o. female with a recurrent umbilical and a new supraumbilical ventral hernia, both incarcerated.   --Discussed with the patient the findings on her CT scan and exam today.  Her two hernias are small as far as the defect size, but the epigastric one is more tender.  Both are incarcerated, but without evidence of strangulation.  Discussed with her the role for surgery to repair the hernias and use of mesh for reinforcement.  She is concerned about the use of mesh as she has friends that have had complications from mesh use.  Discussed with her that primary repair alone does put her at increased risk of recurrence, and potentially we could use an absorbable mesh that would help with strengthening the repair while not being permanent.  She's in agreement with this. --Discussed with her then the plan for a robotic assisted ventral hernia repair.  Reviewed the surgery at length with her including the planned  incisions, the risks of bleeding, infection, injury to surrounding structures, that this would be an outpatient surgery, post-operative activity restriction, pain control, and she's willing to proceed. --Patient is scheduled for surgery on 11/25/22. All of her questions have been answered.    Howie Ill, MD  [facility]

## 2022-11-25 NOTE — Anesthesia Procedure Notes (Addendum)
Procedure Name: Intubation Date/Time: 11/25/2022 7:45 AM  Performed by: Mohammed Kindle, CRNAPre-anesthesia Checklist: Patient identified, Emergency Drugs available, Suction available and Patient being monitored Patient Re-evaluated:Patient Re-evaluated prior to induction Oxygen Delivery Method: Circle system utilized Preoxygenation: Pre-oxygenation with 100% oxygen Induction Type: IV induction Ventilation: Mask ventilation without difficulty Laryngoscope Size: McGraph and 1 Grade View: Grade I Tube type: Oral Tube size: 7.0 mm Number of attempts: 1 Airway Equipment and Method: Stylet and Oral airway Placement Confirmation: ETT inserted through vocal cords under direct vision, positive ETCO2, breath sounds checked- equal and bilateral and CO2 detector Secured at: 21 cm Tube secured with: Tape Dental Injury: Teeth and Oropharynx as per pre-operative assessment

## 2022-11-25 NOTE — Op Note (Signed)
  Procedure Date:  11/25/2022  Pre-operative Diagnosis:  Umbilical and supraumbilical hernias  Post-operative Diagnosis: Umbilical and supraumbilical hernias, total defect length of 6 cm.  Procedure:  Robotic assisted Ventral Hernia Repair with mesh  Surgeon:  Howie Ill, MD  Anesthesia:  General endotracheal  Estimated Blood Loss:  10 ml  Specimens:  None  Complications:  None  Indications for Procedure:  This is a 62 y.o. female who presents with an umbilical and supraumbilical hernia.  The options of surgery versus observation were reviewed with the patient and/or family. The risks of bleeding, abscess or infection, recurrence of symptoms, potential for an open procedure, injury to surrounding structures, and chronic pain were all discussed with the patient and was willing to proceed.  Description of Procedure: The patient was correctly identified in the preoperative area and brought into the operating room.  The patient was placed supine with VTE prophylaxis in place.  Appropriate time-outs were performed.  Anesthesia was induced and the patient was intubated.  Appropriate antibiotics were infused.  The abdomen was prepped and draped in a sterile fashion. The patient's hernia defect was marked with a marking pen.  A Veress needle was introduced in the left upper quadrant and pneumoperitoneum was obtained with appropriate pressures.  Using Optiview technique, an 8 mm port was introduced in the left lateral abdominal wall without complications.  Then, a 12 mm port was introduced in the left upper quadrant and an 8 mm port in the left lower quadrant under direct visualization.  The DaVinci platform was docked, camera targeted, and instruments placed under direct visualization.  The patient's hernia was fully reduced and the peritoneum and preperitoneal fat were dissected and resected to allow better exposure of the hernia defect and for better mesh placement.  The patient had two  defects as noted on her CT scan, with total length of 6 cm combined.  A 4 x 6 inch Bard Ventralight ST Echo mesh, a 0 Stratafix suture, and three 2-0 V-loc sutures were inserted through the 12 mm port under direct visualization.  The hernia defects were closed using the stratafix suture.  A PMI was brought through the center of the hernia defect and the positioning system of the mesh was passed through and insufflated.  This allowed the mesh to splay open and be centered over the repair site with good overlap.  The mesh was then sutured in place circumferentially and through the center of the mesh using the V-loc sutures.  All needles and the positioning system were then removed through the 12 mm port without complications.  The preperitoneal fat was placed in an Endocatch bag.  The DaVinci platform was then undocked and instruments removed.    50 ml of Exparel solution mixed with 0.5% bupivacaine with epi was infiltrated around the mesh edges, hernia repair site, and port sites.  The 12 mm port was removed and Endocatch bag retrieved.  The fascia was closed under direct visualization utilizing an Endo Close technique with 0 Vicryl suture.  The 8 mm ports were removed. The 12 mm incision was closed using 3-0 Vicryl and 4-0 Monocryl, and the other port incisions were closed with 4-0 Monocryl.  The wounds were cleaned and sealed with DermaBond.  The patient was emerged from anesthesia and extubated and brought to the recovery room for further management.  The patient tolerated the procedure well and all counts were correct at the end of the case.   Howie Ill, MD

## 2022-11-25 NOTE — Transfer of Care (Signed)
Immediate Anesthesia Transfer of Care Note  Patient: Alicia Nichols  Procedure(s) Performed: XI ROBOTIC ASSISTED VENTRAL HERNIA, incarcerated (Abdomen) INSERTION OF MESH (Abdomen)  Patient Location: PACU  Anesthesia Type:General  Level of Consciousness: awake, drowsy, and patient cooperative  Airway & Oxygen Therapy: Patient Spontanous Breathing and Patient connected to face mask oxygen  Post-op Assessment: Report given to RN and Post -op Vital signs reviewed and stable  Post vital signs: Reviewed and stable  Last Vitals:  Vitals Value Taken Time  BP 155/63 11/25/22 1016  Temp 36.2 C 11/25/22 1010  Pulse 55 11/25/22 1020  Resp 16 11/25/22 1010  SpO2 100 % 11/25/22 1020  Vitals shown include unvalidated device data.  Last Pain:  Vitals:   11/25/22 0634  TempSrc: Oral  PainSc: 0-No pain         Complications: No notable events documented.

## 2022-11-25 NOTE — Discharge Instructions (Addendum)
Discharge Instructions: 1.  Patient may shower, but do not scrub wounds heavily and dab dry only. 2.  Do not submerge wounds in pool/tub until fully healed. 3.  Do not apply ointments or hydrogen peroxide to the wounds. 4.  May apply ice packs to the wounds for comfort. 5.  Do not drive while taking narcotics for pain control.  Prior to driving, make sure you are able to rotate right and left to look at blindspots without significant pain or discomfort. 6.  No heavy lifting or pushing of more than 10-15 lbs for 4 weeks.   AMBULATORY SURGERY  DISCHARGE INSTRUCTIONS   The drugs that you were given will stay in your system until tomorrow so for the next 24 hours you should not:  Drive an automobile Make any legal decisions Drink any alcoholic beverage   You may resume regular meals tomorrow.  Today it is better to start with liquids and gradually work up to solid foods.  You may eat anything you prefer, but it is better to start with liquids, then soup and crackers, and gradually work up to solid foods.   Please notify your doctor immediately if you have any unusual bleeding, trouble breathing, redness and pain at the surgery site, drainage, fever, or pain not relieved by medication.   Information for Discharge Teaching:  DO NOT REMOVE TEAL BRACELET FOR 4 Days (96 hours) 11/29/2022 EXPAREL (bupivacaine liposome injectable suspension)   Your surgeon or anesthesiologist gave you EXPAREL(bupivacaine) to help control your pain after surgery.  EXPAREL is a local anesthetic that provides pain relief by numbing the tissue around the surgical site. EXPAREL is designed to release pain medication over time and can control pain for up to 72 hours. Depending on how you respond to EXPAREL, you may require less pain medication during your recovery.  Possible side effects: Temporary loss of sensation or ability to move in the area where bupivacaine was injected. Nausea, vomiting,  constipation Rarely, numbness and tingling in your mouth or lips, lightheadedness, or anxiety may occur. Call your doctor right away if you think you may be experiencing any of these sensations, or if you have other questions regarding possible side effects.  Follow all other discharge instructions given to you by your surgeon or nurse. Eat a healthy diet and drink plenty of water or other fluids.  If you return to the hospital for any reason within 96 hours following the administration of EXPAREL, it is important for health care providers to know that you have received this anesthetic. A teal colored band has been placed on your arm with the date, time and amount of EXPAREL you have received in order to alert and inform your health care providers. Please leave this armband in place for the full 96 hours following administration, and then you may remove the band.        Please contact your physician with any problems or Same Day Surgery at [phone], Monday through Friday 6 am to 4 pm, or [place] at [facility] number at [phone].

## 2022-11-26 ENCOUNTER — Encounter: Payer: Self-pay | Admitting: Surgery

## 2022-11-26 NOTE — Anesthesia Postprocedure Evaluation (Signed)
Anesthesia Post Note  Patient: Alicia Nichols  Procedure(s) Performed: XI ROBOTIC ASSISTED VENTRAL HERNIA, incarcerated (Abdomen) INSERTION OF MESH (Abdomen)  Patient location during evaluation: PACU Anesthesia Type: General Level of consciousness: awake and alert Pain management: pain level controlled Vital Signs Assessment: post-procedure vital signs reviewed and stable Respiratory status: spontaneous breathing, nonlabored ventilation, respiratory function stable and patient connected to nasal cannula oxygen Cardiovascular status: blood pressure returned to baseline and stable Postop Assessment: no apparent nausea or vomiting Anesthetic complications: no   No notable events documented.   Last Vitals:  Vitals:   11/25/22 1130 11/25/22 1159  BP: (!) 166/71 (!) 160/68  Pulse: 62   Resp: 20 18  Temp: 36.6 C 36.7 C  SpO2: 100% 98%    Last Pain:  Vitals:   11/25/22 1159  TempSrc: Oral  PainSc:                  Corinda Gubler

## 2022-12-06 ENCOUNTER — Other Ambulatory Visit: Payer: Self-pay | Admitting: Surgery

## 2022-12-06 MED ORDER — OXYCODONE HCL 5 MG PO TABS: 5.0000 mg | ORAL_TABLET | ORAL | 0 refills | Status: DC | PRN

## 2022-12-07 ENCOUNTER — Ambulatory Visit (INDEPENDENT_AMBULATORY_CARE_PROVIDER_SITE_OTHER): Payer: Medicare Other | Admitting: Internal Medicine

## 2022-12-07 ENCOUNTER — Encounter: Payer: Self-pay | Admitting: Internal Medicine

## 2022-12-07 VITALS — BP 134/70 | HR 82 | Ht 66.0 in | Wt 207.8 lb

## 2022-12-07 DIAGNOSIS — I10 Essential (primary) hypertension: Secondary | ICD-10-CM

## 2022-12-07 DIAGNOSIS — E785 Hyperlipidemia, unspecified: Secondary | ICD-10-CM | POA: Diagnosis not present

## 2022-12-07 DIAGNOSIS — Z0000 Encounter for general adult medical examination without abnormal findings: Secondary | ICD-10-CM | POA: Diagnosis not present

## 2022-12-07 DIAGNOSIS — M797 Fibromyalgia: Secondary | ICD-10-CM | POA: Diagnosis not present

## 2022-12-07 DIAGNOSIS — Z8711 Personal history of peptic ulcer disease: Secondary | ICD-10-CM | POA: Diagnosis not present

## 2022-12-07 DIAGNOSIS — B002 Herpesviral gingivostomatitis and pharyngotonsillitis: Secondary | ICD-10-CM

## 2022-12-07 DIAGNOSIS — G43009 Migraine without aura, not intractable, without status migrainosus: Secondary | ICD-10-CM

## 2022-12-07 DIAGNOSIS — Z1231 Encounter for screening mammogram for malignant neoplasm of breast: Secondary | ICD-10-CM

## 2022-12-07 LAB — POCT URINALYSIS DIPSTICK
Bilirubin, UA: NEGATIVE
Blood, UA: NEGATIVE
Glucose, UA: NEGATIVE
Ketones, UA: NEGATIVE
Leukocytes, UA: NEGATIVE
Nitrite, UA: NEGATIVE
Protein, UA: NEGATIVE
Spec Grav, UA: 1.015 (ref 1.010–1.025)
Urobilinogen, UA: 0.2 E.U./dL
pH, UA: 6 (ref 5.0–8.0)

## 2022-12-07 MED ORDER — SUMATRIPTAN SUCCINATE 100 MG PO TABS: 100.0000 mg | ORAL_TABLET | Freq: Every day | ORAL | 5 refills | Status: DC | PRN

## 2022-12-07 MED ORDER — VALACYCLOVIR HCL 1 G PO TABS
1000.0000 mg | ORAL_TABLET | Freq: Two times a day (BID) | ORAL | 0 refills | Status: AC
Start: 2022-12-07 — End: 2022-12-17

## 2022-12-07 MED ORDER — LANSOPRAZOLE 30 MG PO CPDR: 30.0000 mg | DELAYED_RELEASE_CAPSULE | Freq: Every day | ORAL | 3 refills | Status: DC

## 2022-12-07 MED ORDER — LISINOPRIL-HYDROCHLOROTHIAZIDE 20-12.5 MG PO TABS: 1.0000 | ORAL_TABLET | Freq: Every day | ORAL | 1 refills | Status: DC

## 2022-12-07 NOTE — Patient Instructions (Signed)
Call [facility]

## 2022-12-07 NOTE — Assessment & Plan Note (Signed)
On daily PPI without recurrent symptoms Also take NSAIDS regularly

## 2022-12-07 NOTE — Progress Notes (Signed)
Date:  12/07/2022   Name:  Alicia Nichols   DOB:  26-Apr-1961   MRN:  [id number]   Chief Complaint: Annual Exam Alicia Nichols Word is a 62 y.o. female who presents today for her Complete Annual Exam. She feels poorly. She reports exercising - not at this time, patient just had surgery. She reports she is sleeping poorly. Breast complaints - none. She is recovering from abdominal hernia surgery and is still sore.  She is also now on Medicare due to disability.  Followed by [facility] pain management.  Mammogram: 12/2021 [facility] DEXA: none Pap smear: 06/2021 neg/neg Colonoscopy: 12/2016 repeat 10 yrs  Health Maintenance Due  Topic Date Due   Medicare Annual Wellness (AWV)  Never done   Zoster Vaccines- Shingrix (1 of 2) Never done   COVID-19 Vaccine (3 - 2023-24 season) 02/05/2022    Immunization History  Administered Date(s) Administered   Influenza,inj,Quad PF,6+ Mos 05/09/2019, 06/12/2021   PFIZER(Purple Top)SARS-COV-2 Vaccination 11/13/2019, 12/06/2019   Tdap 04/24/2013    Hypertension This is a chronic problem. The problem is controlled. Pertinent negatives include no chest pain, headaches, palpitations or shortness of breath. Past treatments include ACE inhibitors and diuretics. The current treatment provides significant improvement. There is no history of kidney disease, CAD/MI or CVA.  Migraine  This is a recurrent problem. The problem occurs intermittently. The problem has been unchanged. Associated symptoms include back pain. Pertinent negatives include no abdominal pain, coughing, dizziness, fever, hearing loss, tinnitus or vomiting. She has tried triptans for the symptoms. Her past medical history is significant for hypertension.    Lab Results  Component Value Date   NA 137 09/30/2022   K 4.3 09/30/2022   CO2 26 09/30/2022   GLUCOSE 86 09/30/2022   BUN 17 09/30/2022   CREATININE 0.84 09/30/2022   CALCIUM 9.2 09/30/2022   EGFR 80 09/30/2022   GFRNONAA 83 11/20/2019   Lab  Results  Component Value Date   CHOL 257 (H) 12/01/2021   HDL 59 12/01/2021   LDLCALC 171 (H) 12/01/2021   TRIG 149 12/01/2021   CHOLHDL 4.4 12/01/2021   Lab Results  Component Value Date   TSH 4.050 12/01/2021   Lab Results  Component Value Date   HGBA1C 5.2 12/01/2021   Lab Results  Component Value Date   WBC 4.3 09/30/2022   HGB 12.0 09/30/2022   HCT 36.4 09/30/2022   MCV 89 09/30/2022   PLT 351 09/30/2022   Lab Results  Component Value Date   ALT 12 09/30/2022   AST 14 09/30/2022   ALKPHOS 100 09/30/2022   BILITOT 0.4 09/30/2022   No results found for: "25OHVITD2", "25OHVITD3", "VD25OH"   Review of Systems  Constitutional:  Negative for chills, fatigue and fever.  HENT:  Negative for congestion, hearing loss, tinnitus, trouble swallowing and voice change.   Eyes:  Negative for visual disturbance.  Respiratory:  Negative for cough, chest tightness, shortness of breath and wheezing.   Cardiovascular:  Negative for chest pain, palpitations and leg swelling.  Gastrointestinal:  Negative for abdominal pain, constipation, diarrhea and vomiting.  Endocrine: Negative for polydipsia and polyuria.  Genitourinary:  Negative for dysuria, frequency, genital sores, vaginal bleeding and vaginal discharge.  Musculoskeletal:  Positive for arthralgias, back pain, gait problem and myalgias. Negative for joint swelling.  Skin:  Negative for color change and rash.  Neurological:  Negative for dizziness, tremors, light-headedness and headaches.  Hematological:  Negative for adenopathy. Does not bruise/bleed easily.  Psychiatric/Behavioral:  Positive for dysphoric mood. Negative for sleep disturbance. The patient is nervous/anxious.     Patient Active Problem List   Diagnosis Date Noted   Refusal of blood transfusions as patient is Jehovah's Witness 10/05/2022   Pruritic condition 10/05/2022   Vaginal atrophy 10/05/2022   Generalized anxiety disorder 01/22/2022   Dyspareunia in  female 06/30/2021   Chronic idiopathic constipation 11/20/2019   Drug-induced constipation 11/10/2017   Polyp of sigmoid colon    Hyperlipidemia, mild 11/10/2016   Rosacea 10/21/2015   Fibrocystic breast changes 06/06/2015   Essential (primary) hypertension 12/03/2014   Urge incontinence 12/03/2014   H/O peptic ulcer 12/03/2014   Fibromyalgia 12/03/2014   Migraine without aura and without status migrainosus, not intractable 12/03/2014   Mild intermittent asthma without complication 12/03/2014   Idiopathic insomnia 12/03/2014    Allergies  Allergen Reactions   Aspirin Shortness Of Breath   Hydrocodone Shortness Of Breath   Ambien [Zolpidem]     Suicidal, also caused vision issues   Bupropion Hcl     intolerance   Divalproex Sodium Er Nausea And Vomiting   Duloxetine Hcl     intolerance   Escitalopram     intolerance   Macrobid [Nitrofurantoin]     Severe abdominal pain   Morphine Sulfate Itching   Naproxen     delusions   Other Swelling    Beer and liquor but not sure what the main cause is   Oxycodone Hcl Nausea And Vomiting    Can only take the Mallincrodt manufactured    Ampicillin Rash   Codeine Itching and Rash        Latex Rash    Gloves - when worn for extended period   Oxybutynin Other (See Comments)    Headache   Sulfa Antibiotics Rash    Past Surgical History:  Procedure Laterality Date   arm surgery Left 2018   reduction osteotomy   BACK SURGERY     BILATERAL CARPAL TUNNEL RELEASE Bilateral 2017   CATARACT EXTRACTION W/PHACO Right 04/16/2019   Procedure: CATARACT EXTRACTION PHACO AND INTRAOCULAR LENS PLACEMENT (IOC) RIGHT SYMFONY LENS 00:31.0  9.5%  3.03;  Surgeon: Nevada Crane, MD;  Location: [facility];  Service: Ophthalmology;  Laterality: Right;  Latex   CATARACT EXTRACTION W/PHACO Left 05/14/2019   Procedure: CATARACT EXTRACTION PHACO AND INTRAOCULAR LENS PLACEMENT (IOC) LEFT SYMFONY LENS, 0.25, 00:04.9;  Surgeon: Nevada Crane, MD;  Location: [facility];  Service: Ophthalmology;  Laterality: Left;   COLONOSCOPY WITH PROPOFOL N/A 12/16/2016   Procedure: COLONOSCOPY WITH PROPOFOL;  Surgeon: Midge Minium, MD;  Location: [facility];  Service: Endoscopy;  Laterality: N/A;   HAND SURGERY Left 2008   trauma surgery after dog bite   INSERTION OF MESH N/A 11/25/2022   Procedure: INSERTION OF MESH;  Surgeon: Henrene Dodge, MD;  Location: [facility];  Service: General;  Laterality: N/A;   KNEE ARTHROSCOPY     LAMINECTOMY  1992   MENISCUS REPAIR Right 01/2014   POLYPECTOMY  12/16/2016   Procedure: POLYPECTOMY INTESTINAL;  Surgeon: Midge Minium, MD;  Location: [facility];  Service: Endoscopy;;   TOTAL ABDOMINAL HYSTERECTOMY  2011   total with BSO by patient report   XI ROBOTIC ASSISTED VENTRAL HERNIA N/A 11/25/2022   Procedure: XI ROBOTIC ASSISTED VENTRAL HERNIA, incarcerated;  Surgeon: Henrene Dodge, MD;  Location: [facility];  Service: General;  Laterality: N/A;    Social History   Tobacco Use   Smoking  status: Former    Years: 10    Types: Cigarettes    Quit date: 1988    Years since quitting: 36.5   Smokeless tobacco: Never  Vaping Use   Vaping Use: Never used  Substance Use Topics   Alcohol use: Yes    Alcohol/week: 0.0 standard drinks of alcohol    Comment: may have a drink several times per year   Drug use: No     Medication list has been reviewed and updated.  Current Meds  Medication Sig   acetaminophen (TYLENOL) 500 MG tablet Take 2 tablets (1,000 mg total) by mouth every 6 (six) hours as needed for mild pain.   albuterol (VENTOLIN HFA) 108 (90 Base) MCG/ACT inhaler Inhale 2 puffs into the lungs every 6 (six) hours as needed for wheezing or shortness of breath.   ALPRAZolam (XANAX) 0.25 MG tablet Take 1 tablet (0.25 mg total) by mouth daily as needed for anxiety.   conjugated estrogens (PREMARIN) vaginal cream Insert 1 g vaginally once weekly as maintenance    cyanocobalamin (VITAMIN B12) 1000 MCG tablet Take 1,000 mcg by mouth as needed.   diclofenac (VOLTAREN) 75 MG EC tablet Take 75 mg by mouth 2 (two) times daily as needed.   diclofenac Sodium (VOLTAREN) 1 % GEL Apply topically as needed.   diphenhydrAMINE (BENADRYL) 50 MG capsule Take 50 mg by mouth every 6 (six) hours as needed.   estradiol (CLIMARA - DOSED IN MG/24 HR) 0.05 mg/24hr patch Place 1 patch (0.05 mg total) onto the skin once a week.   ibuprofen (ADVIL) 800 MG tablet Take 1 tablet (800 mg total) by mouth every 8 (eight) hours as needed for moderate pain.   metaxalone (SKELAXIN) 800 MG tablet Take 800 mg by mouth daily. 1/2 at bedtime and as needed   oxyCODONE (ROXICODONE) 5 MG immediate release tablet Take 1 tablet (5 mg total) by mouth every 4 (four) hours as needed for severe pain.   Probiotic Product (PROBIOTIC DAILY PO) Take by mouth daily.   Pseudoephedrine-APAP-DM (DAYQUIL PO) Take by mouth as needed.   valACYclovir (VALTREX) 1000 MG tablet Take 1 tablet (1,000 mg total) by mouth 2 (two) times daily for 10 days.   [DISCONTINUED] lansoprazole (PREVACID) 30 MG capsule Take 1 capsule (30 mg total) by mouth daily at 12 noon.   [DISCONTINUED] lisinopril-hydrochlorothiazide (ZESTORETIC) 20-12.5 MG tablet TAKE 1 TABLET BY MOUTH EVERY DAY   [DISCONTINUED] SUMAtriptan (IMITREX) 100 MG tablet Take 1 tablet (100 mg total) by mouth daily as needed. (Patient taking differently: Take 100 mg by mouth daily as needed. Takes 1/2 tablet)       12/07/2022   10:14 AM 10/05/2022    1:21 PM 01/22/2022   11:11 AM 12/01/2021   10:33 AM  GAD 7 : Generalized Anxiety Score  Nervous, Anxious, on Edge 2 1 1 3   Control/stop worrying 0 0 1 1  Worry too much - different things 1 3 2 1   Trouble relaxing 3 2 1 3   Restless 2 1 1 1   Easily annoyed or irritable 0 1 0 3  Afraid - awful might happen 0 0 0 0  Total GAD 7 Score 8 8 6 12   Anxiety Difficulty Not difficult at all Very difficult Not difficult at all  Somewhat difficult       12/07/2022   10:14 AM 10/05/2022    1:21 PM 01/22/2022   11:11 AM  Depression screen PHQ 2/9  Decreased Interest 3 2 1   Down,  Depressed, Hopeless 0 1 1  PHQ - 2 Score 3 3 2   Altered sleeping 2 3 0  Tired, decreased energy 3 3 3   Change in appetite 2 1 0  Feeling bad or failure about yourself  0 1 0  Trouble concentrating 3 2 1   Moving slowly or fidgety/restless 2 2 0  Suicidal thoughts 0 0 0  PHQ-9 Score 15 15 6   Difficult doing work/chores Extremely dIfficult Very difficult Not difficult at all    BP Readings from Last 3 Encounters:  12/07/22 134/70  11/25/22 (!) 160/68  10/25/22 (!) 166/90    Physical Exam Vitals and nursing note reviewed.  Constitutional:      General: She is not in acute distress.    Appearance: She is well-developed.  HENT:     Head: Normocephalic and atraumatic.     Right Ear: Tympanic membrane and ear canal normal.     Left Ear: Tympanic membrane and ear canal normal.     Nose:     Right Sinus: No maxillary sinus tenderness.     Left Sinus: No maxillary sinus tenderness.     Mouth/Throat:     Lips: Lesions present.      Comments: Patient is not sure if this is HSV or some other lesion as it will occur then flake off and then recur.   Eyes:     General: No scleral icterus.       Right eye: No discharge.        Left eye: No discharge.     Conjunctiva/sclera: Conjunctivae normal.  Neck:     Thyroid: No thyromegaly.     Vascular: No carotid bruit.  Cardiovascular:     Rate and Rhythm: Normal rate and regular rhythm.     Pulses: Normal pulses.     Heart sounds: Normal heart sounds.  Pulmonary:     Effort: Pulmonary effort is normal. No respiratory distress.     Breath sounds: No wheezing.  Abdominal:     General: Abdomen is flat.     Palpations: Abdomen is soft.     Tenderness: There is no abdominal tenderness.       Comments: Laparotomy scars healing  Musculoskeletal:        General: Normal range of motion.      Cervical back: Normal range of motion. No erythema.     Right lower leg: No edema.     Left lower leg: No edema.  Lymphadenopathy:     Cervical: No cervical adenopathy.  Skin:    General: Skin is warm and dry.     Capillary Refill: Capillary refill takes less than 2 seconds.     Findings: No rash.  Neurological:     General: No focal deficit present.     Mental Status: She is alert and oriented to person, place, and time.     Cranial Nerves: No cranial nerve deficit.     Sensory: No sensory deficit.     Deep Tendon Reflexes: Reflexes are normal and symmetric.  Psychiatric:        Attention and Perception: Attention normal.        Mood and Affect: Mood normal.     Wt Readings from Last 3 Encounters:  12/07/22 207 lb 12.8 oz (94.3 kg)  11/25/22 206 lb (93.4 kg)  10/25/22 210 lb 6.4 oz (95.4 kg)    BP 134/70   Pulse 82   Ht 5\' 6"  (1.676 m)   Wt 207 lb  12.8 oz (94.3 kg)   SpO2 97%   BMI 33.54 kg/m   Assessment and Plan:  Problem List Items Addressed This Visit     Migraine without aura and without status migrainosus, not intractable (Chronic)    No recent change in the character or frequency of headaches. Headaches respond well to current therapy with imitrex PRN. She does have some side effects so she is encouraged to try the Nurtec samples given previously.        Relevant Medications   lisinopril-hydrochlorothiazide (ZESTORETIC) 20-12.5 MG tablet   SUMAtriptan (IMITREX) 100 MG tablet   Hyperlipidemia, mild (Chronic)     LDL is  Lab Results  Component Value Date   LDLCALC 171 (H) 12/01/2021  On diet alone.       Relevant Medications   lisinopril-hydrochlorothiazide (ZESTORETIC) 20-12.5 MG tablet   Other Relevant Orders   Lipid panel   H/O peptic ulcer (Chronic)    On daily PPI without recurrent symptoms Also take NSAIDS regularly      Relevant Medications   lansoprazole (PREVACID) 30 MG capsule   Other Relevant Orders   CBC with  Differential/Platelet   Fibromyalgia    Fibromyalgia, OA and chronic pain On Oxycodone and nsaids.  Has SE to Lyrica/gabapentin and Cymbalta See chiropractor for knee pain; has used dry needling as well with some benefit       Essential (primary) hypertension (Chronic)    Stable exam with well controlled BP.  Currently taking lisinopril and hctz. Will continue to recommend low sodium diet and current regimen.       Relevant Medications   lisinopril-hydrochlorothiazide (ZESTORETIC) 20-12.5 MG tablet   Other Relevant Orders   CBC with Differential/Platelet   Comprehensive metabolic panel   TSH   POCT urinalysis dipstick   Other Visit Diagnoses     Annual physical exam    -  Primary   Normal exam - work on healthy diet; exercise when able she will schedule mammogram will message GYN regarding pap smear/?cervix present   Encounter for screening mammogram for breast cancer       schedule mammogram soon at [facility]   Oral herpes simplex infection       Relevant Medications   valACYclovir (VALTREX) 1000 MG tablet       Return in about 6 months (around 06/09/2023) for HTN.   Partially dictated using Dragon software, any errors are not intentional.  Reubin Milan, MD [facility] [place], [place]

## 2022-12-07 NOTE — Assessment & Plan Note (Signed)
  LDL is  Lab Results  Component Value Date   LDLCALC 171 (H) 12/01/2021  On diet alone.

## 2022-12-07 NOTE — Assessment & Plan Note (Addendum)
No recent change in the character or frequency of headaches. Headaches respond well to current therapy with imitrex PRN. She does have some side effects so she is encouraged to try the Nurtec samples given previously.

## 2022-12-07 NOTE — Assessment & Plan Note (Signed)
Stable exam with well controlled BP.  Currently taking lisinopril and hctz. Will continue to recommend low sodium diet and current regimen.

## 2022-12-07 NOTE — Assessment & Plan Note (Addendum)
Fibromyalgia, OA and chronic pain On Oxycodone and nsaids.  Has SE to Lyrica/gabapentin and Cymbalta See chiropractor for knee pain; has used dry needling as well with some benefit

## 2022-12-08 ENCOUNTER — Encounter: Payer: Self-pay | Admitting: Surgery

## 2022-12-08 ENCOUNTER — Ambulatory Visit (INDEPENDENT_AMBULATORY_CARE_PROVIDER_SITE_OTHER): Payer: Medicare Other | Admitting: Surgery

## 2022-12-08 VITALS — BP 136/85 | HR 71 | Temp 98.3°F | Ht 66.0 in | Wt 206.8 lb

## 2022-12-08 DIAGNOSIS — K429 Umbilical hernia without obstruction or gangrene: Secondary | ICD-10-CM | POA: Diagnosis not present

## 2022-12-08 DIAGNOSIS — K439 Ventral hernia without obstruction or gangrene: Secondary | ICD-10-CM

## 2022-12-08 LAB — CBC WITH DIFFERENTIAL/PLATELET
Basophils Absolute: 0.1 10*3/uL (ref 0.0–0.2)
Basos: 1 %
EOS (ABSOLUTE): 0.4 10*3/uL (ref 0.0–0.4)
Eos: 7 %
Hematocrit: 35.5 % (ref 34.0–46.6)
Hemoglobin: 11.7 g/dL (ref 11.1–15.9)
Immature Grans (Abs): 0 10*3/uL (ref 0.0–0.1)
Immature Granulocytes: 0 %
Lymphocytes Absolute: 1.6 10*3/uL (ref 0.7–3.1)
Lymphs: 33 %
MCH: 28.8 pg (ref 26.6–33.0)
MCHC: 33 g/dL (ref 31.5–35.7)
MCV: 87 fL (ref 79–97)
Monocytes Absolute: 0.5 10*3/uL (ref 0.1–0.9)
Monocytes: 11 %
Neutrophils Absolute: 2.2 10*3/uL (ref 1.4–7.0)
Neutrophils: 48 %
Platelets: 392 10*3/uL (ref 150–450)
RBC: 4.06 x10E6/uL (ref 3.77–5.28)
RDW: 12.7 % (ref 11.7–15.4)
WBC: 4.7 10*3/uL (ref 3.4–10.8)

## 2022-12-08 LAB — COMPREHENSIVE METABOLIC PANEL
ALT: 13 IU/L (ref 0–32)
AST: 16 IU/L (ref 0–40)
Albumin: 4.3 g/dL (ref 3.8–4.9)
Alkaline Phosphatase: 92 IU/L (ref 44–121)
BUN/Creatinine Ratio: 19 (ref 12–28)
BUN: 18 mg/dL (ref 8–27)
Bilirubin Total: 0.2 mg/dL (ref 0.0–1.2)
CO2: 24 mmol/L (ref 20–29)
Calcium: 9.3 mg/dL (ref 8.7–10.3)
Chloride: 97 mmol/L (ref 96–106)
Creatinine, Ser: 0.95 mg/dL (ref 0.57–1.00)
Globulin, Total: 2.7 g/dL (ref 1.5–4.5)
Glucose: 90 mg/dL (ref 70–99)
Potassium: 3.9 mmol/L (ref 3.5–5.2)
Sodium: 136 mmol/L (ref 134–144)
Total Protein: 7 g/dL (ref 6.0–8.5)
eGFR: 69 mL/min/{1.73_m2} (ref >59)

## 2022-12-08 LAB — LIPID PANEL
Chol/HDL Ratio: 4.7 ratio — ABNORMAL HIGH (ref 0.0–4.4)
Cholesterol, Total: 242 mg/dL — ABNORMAL HIGH (ref 100–199)
HDL: 51 mg/dL (ref >39)
LDL Chol Calc (NIH): 160 mg/dL — ABNORMAL HIGH (ref 0–99)
Triglycerides: 172 mg/dL — ABNORMAL HIGH (ref 0–149)
VLDL Cholesterol Cal: 31 mg/dL (ref 5–40)

## 2022-12-08 LAB — TSH: TSH: 1.43 u[IU]/mL (ref 0.450–4.500)

## 2022-12-08 NOTE — Progress Notes (Signed)
12/08/2022  History of Present Illness: Alicia Nichols is a 62 y.o. female s/p robotic ventral hernia repair on 11/25/22.  She had two hernia defects at umbilical and supraumbilical locations, with total defect length of 6 cm.  She presents today for follow up.  She reports that she's been having more pain at the LUQ port site.  She's having some discomfort at the mid abdomen, but her main issue in the LUQ port.  She has prior history of trauma with rib fractures on the left side.  Denies any issues with the incisions themselves.    Past Medical History: Past Medical History:  Diagnosis Date   ACL (anterior cruciate ligament) tear 10/21/2015   Anemia    Anxiety    Asthma    BRCA negative 07/2021   MyRisk neg   Complication of anesthesia    woke up during cataract surgery   Depression    Family history of adverse reaction to anesthesia    sister awareness under anesthesia   Family history of breast cancer    Fibrocystic breast changes    Fibromyalgia    GERD (gastroesophageal reflux disease)    Headache    History of cervical dysplasia    Hypertension    Idiopathic insomnia    Increased risk of breast cancer 07/2021   IBIS=14.5%/riskscore=23.7%   Motion sickness    cars, roller coasters   Osteoarthritis    Pneumonia    Polyp of sigmoid colon    Refusal of blood transfusions as patient is Jehovah's Witness    Rosacea    Urge incontinence      Past Surgical History: Past Surgical History:  Procedure Laterality Date   arm surgery Left 2018   reduction osteotomy   BACK SURGERY     BILATERAL CARPAL TUNNEL RELEASE Bilateral 2017   CATARACT EXTRACTION W/PHACO Right 04/16/2019   Procedure: CATARACT EXTRACTION PHACO AND INTRAOCULAR LENS PLACEMENT (IOC) RIGHT SYMFONY LENS 00:31.0  9.5%  3.03;  Surgeon: Nevada Crane, MD;  Location: [facility];  Service: Ophthalmology;  Laterality: Right;  Latex   CATARACT EXTRACTION W/PHACO Left 05/14/2019   Procedure: CATARACT  EXTRACTION PHACO AND INTRAOCULAR LENS PLACEMENT (IOC) LEFT SYMFONY LENS, 0.25, 00:04.9;  Surgeon: Nevada Crane, MD;  Location: [facility];  Service: Ophthalmology;  Laterality: Left;   COLONOSCOPY WITH PROPOFOL N/A 12/16/2016   Procedure: COLONOSCOPY WITH PROPOFOL;  Surgeon: Midge Minium, MD;  Location: [facility];  Service: Endoscopy;  Laterality: N/A;   HAND SURGERY Left 2008   trauma surgery after dog bite   INSERTION OF MESH N/A 11/25/2022   Procedure: INSERTION OF MESH;  Surgeon: Henrene Dodge, MD;  Location: [facility];  Service: General;  Laterality: N/A;   KNEE ARTHROSCOPY     LAMINECTOMY  1992   MENISCUS REPAIR Right 01/2014   POLYPECTOMY  12/16/2016   Procedure: POLYPECTOMY INTESTINAL;  Surgeon: Midge Minium, MD;  Location: [facility];  Service: Endoscopy;;   TOTAL ABDOMINAL HYSTERECTOMY  2011   total with BSO by patient report   XI ROBOTIC ASSISTED VENTRAL HERNIA N/A 11/25/2022   Procedure: XI ROBOTIC ASSISTED VENTRAL HERNIA, incarcerated;  Surgeon: Henrene Dodge, MD;  Location: [facility];  Service: General;  Laterality: N/A;    Home Medications: Prior to Admission medications   Medication Sig Start Date End Date Taking? Authorizing Provider  acetaminophen (TYLENOL) 500 MG tablet Take 2 tablets (1,000 mg total) by mouth every 6 (six) hours as needed for mild pain.  11/25/22  Yes Dinna Severs, Elita Quick, MD  albuterol (VENTOLIN HFA) 108 (90 Base) MCG/ACT inhaler Inhale 2 puffs into the lungs every 6 (six) hours as needed for wheezing or shortness of breath. 09/29/20  Yes Reubin Milan, MD  ALPRAZolam Prudy Feeler) 0.25 MG tablet Take 1 tablet (0.25 mg total) by mouth daily as needed for anxiety. 01/22/22  Yes Reubin Milan, MD  conjugated estrogens (PREMARIN) vaginal cream Insert 1 g vaginally once weekly as maintenance 10/05/22  Yes Reubin Milan, MD  cyanocobalamin (VITAMIN B12) 1000 MCG tablet Take 1,000 mcg by mouth as needed.   Yes [provider]   diclofenac (VOLTAREN) 75 MG EC tablet Take 75 mg by mouth 2 (two) times daily as needed. 08/30/20  Yes [provider]  diclofenac Sodium (VOLTAREN) 1 % GEL Apply topically as needed.   Yes [provider]  diphenhydrAMINE (BENADRYL) 50 MG capsule Take 50 mg by mouth every 6 (six) hours as needed.   Yes [provider]  estradiol (CLIMARA - DOSED IN MG/24 HR) 0.05 mg/24hr patch Place 1 patch (0.05 mg total) onto the skin once a week. 10/05/22  Yes Reubin Milan, MD  ibuprofen (ADVIL) 800 MG tablet Take 1 tablet (800 mg total) by mouth every 8 (eight) hours as needed for moderate pain. 11/25/22  Yes Marybeth Dandy, Elita Quick, MD  lansoprazole (PREVACID) 30 MG capsule Take 1 capsule (30 mg total) by mouth daily at 12 noon. 12/07/22  Yes Reubin Milan, MD  lisinopril-hydrochlorothiazide (ZESTORETIC) 20-12.5 MG tablet Take 1 tablet by mouth daily. 12/07/22  Yes Reubin Milan, MD  metaxalone (SKELAXIN) 800 MG tablet Take 800 mg by mouth daily. 1/2 at bedtime and as needed 06/08/21  Yes [provider]  oxyCODONE (ROXICODONE) 5 MG immediate release tablet Take 1 tablet (5 mg total) by mouth every 4 (four) hours as needed for severe pain. 12/06/22  Yes Malka Bocek, Elita Quick, MD  Probiotic Product (PROBIOTIC DAILY PO) Take by mouth daily.   Yes [provider]  Pseudoephedrine-APAP-DM (DAYQUIL PO) Take by mouth as needed.   Yes [provider]  SUMAtriptan (IMITREX) 100 MG tablet Take 1 tablet (100 mg total) by mouth daily as needed. 12/07/22  Yes Reubin Milan, MD  valACYclovir (VALTREX) 1000 MG tablet Take 1 tablet (1,000 mg total) by mouth 2 (two) times daily for 10 days. 12/07/22 12/17/22 Yes Reubin Milan, MD    Allergies: Allergies  Allergen Reactions   Aspirin Shortness Of Breath   Hydrocodone Shortness Of Breath   Ambien [Zolpidem]     Suicidal, also caused vision issues   Bupropion Hcl     intolerance   Divalproex Sodium Er Nausea And Vomiting    Duloxetine Hcl     intolerance   Escitalopram     intolerance   Macrobid [Nitrofurantoin]     Severe abdominal pain   Morphine Sulfate Itching   Naproxen     delusions   Other Swelling    Beer and liquor but not sure what the main cause is   Oxycodone Hcl Nausea And Vomiting    Can only take the Mallincrodt manufactured    Ampicillin Rash   Codeine Itching and Rash        Latex Rash    Gloves - when worn for extended period   Oxybutynin Other (See Comments)    Headache   Sulfa Antibiotics Rash    Review of Systems: Review of Systems  Constitutional:  Negative for chills and  fever.  Respiratory:  Negative for shortness of breath.   Cardiovascular:  Negative for chest pain.  Gastrointestinal:  Positive for abdominal pain. Negative for nausea and vomiting.    Physical Exam BP 136/85   Pulse 71   Temp 98.3 F (36.8 C) (Oral)   Ht 5\' 6"  (1.676 m)   Wt 206 lb 12.8 oz (93.8 kg)   SpO2 98%   BMI 33.38 kg/m  CONSTITUTIONAL: No acute distress HEENT:  Normocephalic, atraumatic, extraocular motion intact. RESPIRATORY:  Normal respiratory effort without pathologic use of accessory muscles. CARDIOVASCULAR: Regular rhythm and rate. GI: The abdomen is soft, non-distended, with tenderness in the LUQ port site.  There is no erythema or induration at the incisions.  There is no evidence of hernia recurrence in the midline. NEUROLOGIC:  Motor and sensation is grossly normal.  Cranial nerves are grossly intact. PSYCH:  Alert and oriented to person, place and time. Affect is normal.  Assessment and Plan: This is a 62 y.o. female s/p robotic ventral hernia repair.  --Discussed with the patient that the pain is related to the port site being bigger requiring deeper sutures to close the incision.  Given her prior left rib trauma, this may be exacerbating the issue.  Discussed that as the swelling from surgery goes down, the pain will improve.  The sutures used are also dissolvable.  No  evidence of infection.  Her hernia repair is intact. Discussed with her that she can try OTC lidocaine patches to help with the more localized pain from the port. --Discussed activity restrictions still.  It is ok for her to submerge the wounds.  There is no restriction on position in bed, whether it's lateral, supine, or prone. --Follow up in 4 weeks to reassess her pain/progress.  I spent 20 minutes dedicated to the care of this patient on the date of this encounter to include pre-visit review of records, face-to-face time with the patient discussing diagnosis and management, and any post-visit coordination of care.   Howie Ill, MD [facility]

## 2022-12-08 NOTE — Patient Instructions (Signed)

## 2022-12-13 ENCOUNTER — Ambulatory Visit (INDEPENDENT_AMBULATORY_CARE_PROVIDER_SITE_OTHER): Payer: Medicare Other | Admitting: Internal Medicine

## 2022-12-13 ENCOUNTER — Ambulatory Visit: Payer: Self-pay

## 2022-12-13 ENCOUNTER — Encounter: Payer: Self-pay | Admitting: Internal Medicine

## 2022-12-13 VITALS — BP 120/78 | HR 74 | Ht 66.0 in | Wt 206.0 lb

## 2022-12-13 DIAGNOSIS — W19XXXA Unspecified fall, initial encounter: Secondary | ICD-10-CM

## 2022-12-13 DIAGNOSIS — H1033 Unspecified acute conjunctivitis, bilateral: Secondary | ICD-10-CM

## 2022-12-13 MED ORDER — NEOMYCIN-POLYMYXIN-DEXAMETH 3.5-10000-0.1 OP SUSP
2.0000 [drp] | Freq: Four times a day (QID) | OPHTHALMIC | 0 refills | Status: DC
Start: 2022-12-13 — End: 2023-12-05

## 2022-12-13 NOTE — Telephone Encounter (Signed)
Summary: Pt requests Rx for possible pink eye   Pt requests Rx for possible pink eye be sent to [facility] #[id number] [facility], [place]. Attempted to schedule an appt but there were no appts available with Dr. Judithann Graves until Thursday. Pt requests call back       Chief Complaint: bilateral eyes red draining, blurred vision, requesting medication  Symptoms: s/p surgery 11/25/22 cold sx noted 12/04/22 . Eyes red, itching, draining clear drainage since last Thursday / Friday. Blurred vision reported. Eyes "stuck" together Friday. Has had "pink eye" in the past and looks the same  Frequency: last week  Pertinent Negatives: Patient denies fever Disposition: [] ED /[] Urgent Care (no appt availability in office) / [x] Appointment(In office/virtual)/ []  [facility]/ [] Home Care/ [] Refused Recommended Disposition /[] [facility]/ []  Follow-up with PCP Additional Notes:   No available OV with PCP. Able to schedule acute OV with Dr. Yetta Barre today .       Reason for Disposition  Blurred vision  Answer Assessment - Initial Assessment Questions 1. EYE DISCHARGE: "Is the discharge in one or both eyes?" "What color is it?" "How much is there?" "When did the discharge start?"      Both eyes clear drainage now  started Friday  2. REDNESS OF SCLERA: "Is the redness in one or both eyes?" "When did the redness start?"      Redness  3. EYELIDS: "Are the eyelids red or swollen?" If Yes, ask: "How much?"       Itching red 4. VISION: "Is there any difficulty seeing clearly?"      Blurry  5. PAIN: "Is there any pain? If Yes, ask: "How bad is it?" (Scale 1-10; or mild, moderate, severe)    - MILD (1-3): doesn't interfere with normal activities     - MODERATE (4-7): interferes with normal activities or awakens from sleep    - SEVERE (8-10): excruciating pain, unable to do any normal activities       Painful.  6. CONTACT LENS: "Do you wear contacts?"     na 7. OTHER SYMPTOMS: "Do you have  any other symptoms?" (e.g., fever, runny nose, cough)    Recent cold after surgery 11/25/22. 12/04/22 started cold sx  8. PREGNANCY: "Is there any chance you are pregnant?" "When was your last menstrual period?"     na  Protocols used: Eye - Pus or Discharge-A-AH

## 2022-12-13 NOTE — Telephone Encounter (Signed)
Patient called, left VM to return the call to the office to speak to the NT.   Summary: Pt requests Rx for possible pink eye   Pt requests Rx for possible pink eye be sent to [facility] #[id number] [facility], [place]. Attempted to schedule an appt but there were no appts available with Dr. Judithann Graves until Thursday. Pt requests call back

## 2022-12-13 NOTE — Telephone Encounter (Signed)
Pt has an appt today 7/8.  KP

## 2022-12-13 NOTE — Progress Notes (Signed)
Date:  12/13/2022   Name:  Alicia Nichols   DOB:  1960-12-17   MRN:  [id number]   Chief Complaint: Conjunctivitis (Using over the counter Tears drops. This started last Wednesday night. Eye were itching and red. )  Conjunctivitis  The current episode started 5 to 7 days ago. The onset was sudden. The problem has been unchanged. The problem is moderate. Nothing relieves the symptoms. Associated symptoms include eye itching and eye redness. Pertinent negatives include no fever.    Lab Results  Component Value Date   NA 136 12/07/2022   K 3.9 12/07/2022   CO2 24 12/07/2022   GLUCOSE 90 12/07/2022   BUN 18 12/07/2022   CREATININE 0.95 12/07/2022   CALCIUM 9.3 12/07/2022   EGFR 69 12/07/2022   GFRNONAA 83 11/20/2019   Lab Results  Component Value Date   CHOL 242 (H) 12/07/2022   HDL 51 12/07/2022   LDLCALC 160 (H) 12/07/2022   TRIG 172 (H) 12/07/2022   CHOLHDL 4.7 (H) 12/07/2022   Lab Results  Component Value Date   TSH 1.430 12/07/2022   Lab Results  Component Value Date   HGBA1C 5.2 12/01/2021   Lab Results  Component Value Date   WBC 4.7 12/07/2022   HGB 11.7 12/07/2022   HCT 35.5 12/07/2022   MCV 87 12/07/2022   PLT 392 12/07/2022   Lab Results  Component Value Date   ALT 13 12/07/2022   AST 16 12/07/2022   ALKPHOS 92 12/07/2022   BILITOT 0.2 12/07/2022   No results found for: "25OHVITD2", "25OHVITD3", "VD25OH"   Review of Systems  Constitutional:  Negative for chills, fatigue and fever.  Eyes:  Positive for redness and itching. Negative for visual disturbance.  Respiratory:  Negative for chest tightness and shortness of breath.   Cardiovascular:  Positive for chest pain (fell last week and tripped - thinks she cracker left sided ribs).  Psychiatric/Behavioral:  Positive for dysphoric mood and sleep disturbance. The patient is nervous/anxious.     Patient Active Problem List   Diagnosis Date Noted   Refusal of blood transfusions as patient is  [employer] 10/05/2022   Pruritic condition 10/05/2022   Vaginal atrophy 10/05/2022   Generalized anxiety disorder 01/22/2022   Dyspareunia in female 06/30/2021   Chronic idiopathic constipation 11/20/2019   Drug-induced constipation 11/10/2017   Polyp of sigmoid colon    Hyperlipidemia, mild 11/10/2016   Rosacea 10/21/2015   Fibrocystic breast changes 06/06/2015   Essential (primary) hypertension 12/03/2014   Urge incontinence 12/03/2014   H/O peptic ulcer 12/03/2014   Fibromyalgia 12/03/2014   Migraine without aura and without status migrainosus, not intractable 12/03/2014   Mild intermittent asthma without complication 12/03/2014   Idiopathic insomnia 12/03/2014    Allergies  Allergen Reactions   Aspirin Shortness Of Breath   Hydrocodone Shortness Of Breath   Ambien [Zolpidem]     Suicidal, also caused vision issues   Bupropion Hcl     intolerance   Divalproex Sodium Er Nausea And Vomiting   Duloxetine Hcl     intolerance   Escitalopram     intolerance   Macrobid [Nitrofurantoin]     Severe abdominal pain   Morphine Sulfate Itching   Naproxen     delusions   Other Swelling    Beer and liquor but not sure what the main cause is   Oxycodone Hcl Nausea And Vomiting    Can only take the [place] manufactured    Ampicillin Rash  Codeine Itching and Rash        Latex Rash    Gloves - when worn for extended period   Oxybutynin Other (See Comments)    Headache   Sulfa Antibiotics Rash    Past Surgical History:  Procedure Laterality Date   arm surgery Left 2018   reduction osteotomy   BACK SURGERY     BILATERAL CARPAL TUNNEL RELEASE Bilateral 2017   CATARACT EXTRACTION W/PHACO Right 04/16/2019   Procedure: CATARACT EXTRACTION PHACO AND INTRAOCULAR LENS PLACEMENT (IOC) RIGHT SYMFONY LENS 00:31.0  9.5%  3.03;  Surgeon: Nevada Crane, MD;  Location: [facility];  Service: Ophthalmology;  Laterality: Right;  Latex   CATARACT EXTRACTION W/PHACO  Left 05/14/2019   Procedure: CATARACT EXTRACTION PHACO AND INTRAOCULAR LENS PLACEMENT (IOC) LEFT SYMFONY LENS, 0.25, 00:04.9;  Surgeon: Nevada Crane, MD;  Location: [facility];  Service: Ophthalmology;  Laterality: Left;   COLONOSCOPY WITH PROPOFOL N/A 12/16/2016   Procedure: COLONOSCOPY WITH PROPOFOL;  Surgeon: Midge Minium, MD;  Location: [facility];  Service: Endoscopy;  Laterality: N/A;   HAND SURGERY Left 2008   trauma surgery after dog bite   INSERTION OF MESH N/A 11/25/2022   Procedure: INSERTION OF MESH;  Surgeon: Henrene Dodge, MD;  Location: [facility];  Service: General;  Laterality: N/A;   KNEE ARTHROSCOPY     LAMINECTOMY  1992   MENISCUS REPAIR Right 01/2014   POLYPECTOMY  12/16/2016   Procedure: POLYPECTOMY INTESTINAL;  Surgeon: Midge Minium, MD;  Location: [facility];  Service: Endoscopy;;   TOTAL ABDOMINAL HYSTERECTOMY  2011   total with BSO by patient report   XI ROBOTIC ASSISTED VENTRAL HERNIA N/A 11/25/2022   Procedure: XI ROBOTIC ASSISTED VENTRAL HERNIA, incarcerated;  Surgeon: Henrene Dodge, MD;  Location: [facility];  Service: General;  Laterality: N/A;    Social History   Tobacco Use   Smoking status: Former    Years: 10    Types: Cigarettes    Quit date: 1988    Years since quitting: 36.5   Smokeless tobacco: Never  Vaping Use   Vaping Use: Never used  Substance Use Topics   Alcohol use: Yes    Alcohol/week: 0.0 standard drinks of alcohol    Comment: may have a drink several times per year   Drug use: No     Medication list has been reviewed and updated.  Current Meds  Medication Sig   acetaminophen (TYLENOL) 500 MG tablet Take 2 tablets (1,000 mg total) by mouth every 6 (six) hours as needed for mild pain.   albuterol (VENTOLIN HFA) 108 (90 Base) MCG/ACT inhaler Inhale 2 puffs into the lungs every 6 (six) hours as needed for wheezing or shortness of breath.   ALPRAZolam (XANAX) 0.25 MG tablet Take 1 tablet (0.25 mg  total) by mouth daily as needed for anxiety.   conjugated estrogens (PREMARIN) vaginal cream Insert 1 g vaginally once weekly as maintenance   cyanocobalamin (VITAMIN B12) 1000 MCG tablet Take 1,000 mcg by mouth as needed.   diclofenac (VOLTAREN) 75 MG EC tablet Take 75 mg by mouth 2 (two) times daily as needed.   diclofenac Sodium (VOLTAREN) 1 % GEL Apply topically as needed.   diphenhydrAMINE (BENADRYL) 50 MG capsule Take 50 mg by mouth every 6 (six) hours as needed.   estradiol (CLIMARA - DOSED IN MG/24 HR) 0.05 mg/24hr patch Place 1 patch (0.05 mg total) onto the skin once a week.   ibuprofen (ADVIL) 800  MG tablet Take 1 tablet (800 mg total) by mouth every 8 (eight) hours as needed for moderate pain.   lansoprazole (PREVACID) 30 MG capsule Take 1 capsule (30 mg total) by mouth daily at 12 noon.   lisinopril-hydrochlorothiazide (ZESTORETIC) 20-12.5 MG tablet Take 1 tablet by mouth daily.   metaxalone (SKELAXIN) 800 MG tablet Take 800 mg by mouth daily. 1/2 at bedtime and as needed   neomycin-polymyxin b-dexamethasone (MAXITROL) 3.5-10000-0.1 SUSP Place 2 drops into both eyes every 6 (six) hours.   oxyCODONE (ROXICODONE) 5 MG immediate release tablet Take 1 tablet (5 mg total) by mouth every 4 (four) hours as needed for severe pain.   Probiotic Product (PROBIOTIC DAILY PO) Take by mouth daily.   Pseudoephedrine-APAP-DM (DAYQUIL PO) Take by mouth as needed.   SUMAtriptan (IMITREX) 100 MG tablet Take 1 tablet (100 mg total) by mouth daily as needed.   valACYclovir (VALTREX) 1000 MG tablet Take 1 tablet (1,000 mg total) by mouth 2 (two) times daily for 10 days.       12/13/2022    1:42 PM 12/07/2022   10:14 AM 10/05/2022    1:21 PM 01/22/2022   11:11 AM  GAD 7 : Generalized Anxiety Score  Nervous, Anxious, on Edge 0 2 1 1   Control/stop worrying 0 0 0 1  Worry too much - different things 0 1 3 2   Trouble relaxing 0 3 2 1   Restless 3 2 1 1   Easily annoyed or irritable 0 0 1 0  Afraid - awful  might happen 0 0 0 0  Total GAD 7 Score 3 8 8 6   Anxiety Difficulty Not difficult at all Not difficult at all Very difficult Not difficult at all       12/13/2022    1:41 PM 12/07/2022   10:14 AM 10/05/2022    1:21 PM  Depression screen PHQ 2/9  Decreased Interest 2 3 2   Down, Depressed, Hopeless 2 0 1  PHQ - 2 Score 4 3 3   Altered sleeping 2 2 3   Tired, decreased energy 2 3 3   Change in appetite 2 2 1   Feeling bad or failure about yourself  0 0 1  Trouble concentrating 3 3 2   Moving slowly or fidgety/restless 1 2 2   Suicidal thoughts 0 0 0  PHQ-9 Score 14 15 15   Difficult doing work/chores Somewhat difficult Extremely dIfficult Very difficult    BP Readings from Last 3 Encounters:  12/13/22 120/78  12/08/22 136/85  12/07/22 134/70    Physical Exam Vitals and nursing note reviewed.  Constitutional:      General: She is in acute distress (mild distress due to left sided rib pain).     Appearance: Normal appearance. She is well-developed.  HENT:     Head: Normocephalic and atraumatic.  Eyes:     General: Lids are normal.     Extraocular Movements: Extraocular movements intact.     Conjunctiva/sclera: Conjunctivae normal.     Comments: Mild left sided tearing - no exudate  Cardiovascular:     Rate and Rhythm: Normal rate and regular rhythm.  Pulmonary:     Effort: Pulmonary effort is normal. No respiratory distress.     Breath sounds: No wheezing or rhonchi.  Skin:    General: Skin is warm and dry.     Findings: No rash.  Neurological:     Mental Status: She is alert and oriented to person, place, and time.  Psychiatric:  Mood and Affect: Mood normal.        Behavior: Behavior normal.     Wt Readings from Last 3 Encounters:  12/13/22 206 lb (93.4 kg)  12/08/22 206 lb 12.8 oz (93.8 kg)  12/07/22 207 lb 12.8 oz (94.3 kg)    BP 120/78   Pulse 74   Ht 5\' 6"  (1.676 m)   Wt 206 lb (93.4 kg)   SpO2 99%   BMI 33.25 kg/m   Assessment and Plan:  Problem  List Items Addressed This Visit   None Visit Diagnoses     Acute bacterial conjunctivitis of both eyes    -  Primary   Relevant Medications   neomycin-polymyxin b-dexamethasone (MAXITROL) 3.5-10000-0.1 SUSP   Fall, initial encounter       suspected rib fractures - continue splinting and ice pt declines imaging at this time       No follow-ups on file.   Partially dictated using Dragon software, any errors are not intentional.  Reubin Milan, MD [facility] [place], [place]

## 2022-12-23 DIAGNOSIS — K080 Exfoliation of teeth due to systemic causes: Secondary | ICD-10-CM | POA: Diagnosis not present

## 2023-01-05 ENCOUNTER — Encounter: Payer: Self-pay | Admitting: Surgery

## 2023-01-05 ENCOUNTER — Ambulatory Visit: Payer: Medicare Other | Admitting: Surgery

## 2023-01-05 VITALS — BP 146/85 | HR 109 | Temp 98.8°F | Ht 66.0 in | Wt 207.8 lb

## 2023-01-05 DIAGNOSIS — K429 Umbilical hernia without obstruction or gangrene: Secondary | ICD-10-CM

## 2023-01-05 DIAGNOSIS — K439 Ventral hernia without obstruction or gangrene: Secondary | ICD-10-CM | POA: Diagnosis not present

## 2023-01-05 NOTE — Patient Instructions (Addendum)
If you have any concerns or questions, please feel free to call our office. Follow up as needed.   Laparoscopic Ventral Hernia Repair, Care After The following information offers guidance on how to care for yourself after your procedure. Your health care provider may also give you more specific instructions. If you have problems or questions, contact your health care provider. What can I expect after the procedure? After the procedure, it is common to have pain, discomfort, or soreness. Follow these instructions at home: Medicines Take over-the-counter and prescription medicines only as told by your health care provider. Ask your health care provider if the medicine prescribed to you: Requires you to avoid driving or using machinery. Can cause constipation. You may need to take these actions to prevent or treat constipation: Drink enough fluid to keep your urine pale yellow. Take over-the-counter or prescription medicines. Eat foods that are high in fiber, such as beans, whole grains, and fresh fruits and vegetables. Limit foods that are high in fat and processed sugars, such as fried or sweet foods. Incision care  Follow instructions from your health care provider about how to take care of your incisions. Make sure you: Wash your hands with soap and water for at least 20 seconds before and after you change your bandage (dressing) or before you touch your abdomen. If soap and water are not available, use hand sanitizer. Change your dressing as told by your health care provider. Leave stitches (sutures), skin glue, or adhesive strips in place. These skin closures may need to stay in place for 2 weeks or longer. If adhesive strip edges start to loosen and curl up, you may trim the loose edges. Do not remove adhesive strips completely unless your health care provider tells you to do that. Check your incision areas every day for signs of infection. Check for: More redness, swelling, or  pain. Fluid or blood. Warmth. Pus or a bad smell. Bathing  Do not take baths, swim, or use a hot tub until your health care provider approves. Ask your health care provider if you may take showers. You may only be allowed to take sponge baths. Keep your dressing dry until your health care provider says it can be removed. Activity  Rest as told by your health care provider. Avoid sitting for a long time without moving. Get up to take short walks every 1-2 hours. This is important to improve blood flow and breathing. Ask for help if you feel weak or unsteady. Do not lift anything that is heavier than 10 lb (4.5 kg), or the limit that you are told, until your health care provider says that it is safe. If you were given a sedative during the procedure, it can affect you for several hours. Do not drive or operate machinery until your health care provider says that it is safe. Return to your normal activities as told by your health care provider. Ask your health care provider what activities are safe for you. General instructions  Hold a pillow over your abdomen when you cough or sneeze. This helps with pain. Do not use any products that contain nicotine or tobacco. These products include cigarettes, chewing tobacco, and vaping devices, such as e-cigarettes. These can delay healing after surgery. If you need help quitting, ask your health care provider. You may be asked to continue to do deep breathing exercises at home. This will help to prevent a lung infection. Keep all follow-up visits. This is important. Contact a health care provider  if: You have any of these signs of infection: More redness, swelling, or pain around an incision. Fluid or blood coming from an incision. Warmth coming from an incision. Pus or a bad smell coming from an incision. A fever or chills. You have pain that gets worse or does not get better with medicine. You have nausea or vomiting. You have a cough. You have  shortness of breath. You have not had a bowel movement in 3 days. You are not able to urinate. Get help right away if you have: Severe pain in your abdomen. Persistent nausea and vomiting. Redness, warmth, or pain in your leg. Chest pain. Trouble breathing. These symptoms may represent a serious problem that is an emergency. Do not wait to see if the symptoms will go away. Get medical help right away. Call your local emergency services (911 in the [place] Do not drive yourself to the hospital. Summary After this procedure, it is common to have pain, discomfort, or soreness. Follow instructions from your health care provider about how to take care of your incision. Check your incision area every day for signs of infection. Report any signs of infection to your health care provider. Keep all follow-up visits. This is important. This information is not intended to replace advice given to you by your health care provider. Make sure you discuss any questions you have with your health care provider. Document Revised: 01/11/2020 Document Reviewed: 01/11/2020 Elsevier Patient Education  2024 [employer].

## 2023-01-05 NOTE — Progress Notes (Signed)
01/05/2023  History of Present Illness: Alicia Nichols is a 62 y.o. female s/p robotic ventral hernia repair on 11/25/22.  She had two hernia defects at umbilical and supraumbilical locations, with total defect length of 6 cm.  She presents today for follow up.  She was last seen on 12/08/22 at which time she was having pain at the LUQ port site.  Today that is doing much better and she overall denies any worsening pain.  Has been tapering down her pain medications.  She does report a mild dehiscence of the left middle wound that has been draining a bit.  She's applied triple antibiotic on it just in case.  Past Medical History: Past Medical History:  Diagnosis Date   ACL (anterior cruciate ligament) tear 10/21/2015   Anemia    Anxiety    Asthma    BRCA negative 07/2021   MyRisk neg   Complication of anesthesia    woke up during cataract surgery   Depression    Family history of adverse reaction to anesthesia    sister awareness under anesthesia   Family history of breast cancer    Fibrocystic breast changes    Fibromyalgia    GERD (gastroesophageal reflux disease)    Headache    History of cervical dysplasia    Hypertension    Idiopathic insomnia    Increased risk of breast cancer 07/2021   IBIS=14.5%/riskscore=23.7%   Motion sickness    cars, roller coasters   Osteoarthritis    Pneumonia    Polyp of sigmoid colon    Refusal of blood transfusions as patient is Jehovah's Witness    Rosacea    Urge incontinence      Past Surgical History: Past Surgical History:  Procedure Laterality Date   arm surgery Left 2018   reduction osteotomy   BACK SURGERY     BILATERAL CARPAL TUNNEL RELEASE Bilateral 2017   CATARACT EXTRACTION W/PHACO Right 04/16/2019   Procedure: CATARACT EXTRACTION PHACO AND INTRAOCULAR LENS PLACEMENT (IOC) RIGHT SYMFONY LENS 00:31.0  9.5%  3.03;  Surgeon: Nevada Crane, MD;  Location: [facility];  Service: Ophthalmology;  Laterality: Right;  Latex    CATARACT EXTRACTION W/PHACO Left 05/14/2019   Procedure: CATARACT EXTRACTION PHACO AND INTRAOCULAR LENS PLACEMENT (IOC) LEFT SYMFONY LENS, 0.25, 00:04.9;  Surgeon: Nevada Crane, MD;  Location: [facility];  Service: Ophthalmology;  Laterality: Left;   COLONOSCOPY WITH PROPOFOL N/A 12/16/2016   Procedure: COLONOSCOPY WITH PROPOFOL;  Surgeon: Midge Minium, MD;  Location: [facility];  Service: Endoscopy;  Laterality: N/A;   HAND SURGERY Left 2008   trauma surgery after dog bite   INSERTION OF MESH N/A 11/25/2022   Procedure: INSERTION OF MESH;  Surgeon: Henrene Dodge, MD;  Location: [facility];  Service: General;  Laterality: N/A;   KNEE ARTHROSCOPY     LAMINECTOMY  1992   MENISCUS REPAIR Right 01/2014   POLYPECTOMY  12/16/2016   Procedure: POLYPECTOMY INTESTINAL;  Surgeon: Midge Minium, MD;  Location: [facility];  Service: Endoscopy;;   TOTAL ABDOMINAL HYSTERECTOMY  2011   total with BSO by patient report   XI ROBOTIC ASSISTED VENTRAL HERNIA N/A 11/25/2022   Procedure: XI ROBOTIC ASSISTED VENTRAL HERNIA, incarcerated;  Surgeon: Henrene Dodge, MD;  Location: [facility] ORS;  Service: General;  Laterality: N/A;    Home Medications: Prior to Admission medications   Medication Sig Start Date End Date Taking? Authorizing Provider  acetaminophen (TYLENOL) 500 MG tablet Take 2 tablets (  1,000 mg total) by mouth every 6 (six) hours as needed for mild pain. 11/25/22  Yes Catricia Scheerer, Elita Quick, MD  albuterol (VENTOLIN HFA) 108 (90 Base) MCG/ACT inhaler Inhale 2 puffs into the lungs every 6 (six) hours as needed for wheezing or shortness of breath. 09/29/20  Yes Reubin Milan, MD  ALPRAZolam Prudy Feeler) 0.25 MG tablet Take 1 tablet (0.25 mg total) by mouth daily as needed for anxiety. 01/22/22  Yes Reubin Milan, MD  conjugated estrogens (PREMARIN) vaginal cream Insert 1 g vaginally once weekly as maintenance 10/05/22  Yes Reubin Milan, MD  cyanocobalamin (VITAMIN B12) 1000 MCG  tablet Take 1,000 mcg by mouth as needed.   Yes [provider]  diclofenac (VOLTAREN) 75 MG EC tablet Take 75 mg by mouth 2 (two) times daily as needed. 08/30/20  Yes [provider]  diclofenac Sodium (VOLTAREN) 1 % GEL Apply topically as needed.   Yes [provider]  diphenhydrAMINE (BENADRYL) 50 MG capsule Take 50 mg by mouth every 6 (six) hours as needed.   Yes [provider]  estradiol (CLIMARA - DOSED IN MG/24 HR) 0.05 mg/24hr patch Place 1 patch (0.05 mg total) onto the skin once a week. 10/05/22  Yes Reubin Milan, MD  ibuprofen (ADVIL) 800 MG tablet Take 1 tablet (800 mg total) by mouth every 8 (eight) hours as needed for moderate pain. 11/25/22  Yes Sebrena Engh, Elita Quick, MD  lansoprazole (PREVACID) 30 MG capsule Take 1 capsule (30 mg total) by mouth daily at 12 noon. 12/07/22  Yes Reubin Milan, MD  lisinopril-hydrochlorothiazide (ZESTORETIC) 20-12.5 MG tablet Take 1 tablet by mouth daily. 12/07/22  Yes Reubin Milan, MD  metaxalone (SKELAXIN) 800 MG tablet Take 800 mg by mouth daily. 1/2 at bedtime and as needed 06/08/21  Yes [provider]  oxyCODONE (ROXICODONE) 5 MG immediate release tablet Take 1 tablet (5 mg total) by mouth every 4 (four) hours as needed for severe pain. 12/06/22  Yes Nathanial Arrighi, Elita Quick, MD  Probiotic Product (PROBIOTIC DAILY PO) Take by mouth daily.   Yes [provider]  Pseudoephedrine-APAP-DM (DAYQUIL PO) Take by mouth as needed.   Yes [provider]  SUMAtriptan (IMITREX) 100 MG tablet Take 1 tablet (100 mg total) by mouth daily as needed. 12/07/22  Yes Reubin Milan, MD  valACYclovir (VALTREX) 1000 MG tablet Take 1 tablet (1,000 mg total) by mouth 2 (two) times daily for 10 days. 12/07/22 12/17/22 Yes Reubin Milan, MD    Allergies: Allergies  Allergen Reactions   Aspirin Shortness Of Breath   Hydrocodone Shortness Of Breath   Ambien [Zolpidem]     Suicidal, also caused vision issues   Bupropion  Hcl     intolerance   Divalproex Sodium Er Nausea And Vomiting   Duloxetine Hcl     intolerance   Escitalopram     intolerance   Macrobid [Nitrofurantoin]     Severe abdominal pain   Morphine Sulfate Itching   Naproxen     delusions   Other Swelling    Beer and liquor but not sure what the main cause is   Oxycodone Hcl Nausea And Vomiting    Can only take the Mallincrodt manufactured    Ampicillin Rash   Codeine Itching and Rash        Latex Rash    Gloves - when worn for extended period   Oxybutynin Other (See Comments)    Headache   Sulfa Antibiotics Rash  Review of Systems: Review of Systems  Constitutional:  Negative for chills and fever.  Respiratory:  Negative for shortness of breath.   Cardiovascular:  Negative for chest pain.  Gastrointestinal:  Positive for abdominal pain. Negative for nausea and vomiting.    Physical Exam BP (!) 146/85   Pulse (!) 109   Temp 98.8 F (37.1 C)   Ht 5\' 6"  (1.676 m)   Wt 207 lb 12.8 oz (94.3 kg)   BMI 33.54 kg/m  CONSTITUTIONAL: No acute distress HEENT:  Normocephalic, atraumatic, extraocular motion intact. RESPIRATORY:  Normal respiratory effort without pathologic use of accessory muscles. CARDIOVASCULAR: Regular rhythm and rate. GI: The abdomen is soft, non-distended, with tenderness in the LUQ port site.  There is no erythema or induration at the incisions.  The left middle incision lateral corner has a small area of dehiscence of about 3 mm size, no evidence of infection.  There is no evidence of hernia recurrence in the midline. NEUROLOGIC:  Motor and sensation is grossly normal.  Cranial nerves are grossly intact. PSYCH:  Alert and oriented to person, place and time. Affect is normal.  Assessment and Plan: This is a 62 y.o. female s/p robotic ventral hernia repair.  --Patient is recovering well from her surgery and is now 6 weeks post-op.  She can resume normal activities starting tomorrow.  --The left middle wound  has a small area of opening.  Applied silver nitrate to this area and new BandAid dressing placed.  This should be healed in the next few days. --Discussed with patient that if the wound has not closed in a week, she should let us know so we can re-evaluate it. --Otherwise follow up as needed.  I spent 20 minutes dedicated to the care of this patient on the date of this encounter to include pre-visit review of records, face-to-face time with the patient discussing diagnosis and management, and any post-visit coordination of care.   Howie Ill, MD  [facility]

## 2023-01-10 DIAGNOSIS — K080 Exfoliation of teeth due to systemic causes: Secondary | ICD-10-CM | POA: Diagnosis not present

## 2023-01-31 DIAGNOSIS — G894 Chronic pain syndrome: Secondary | ICD-10-CM | POA: Diagnosis not present

## 2023-01-31 DIAGNOSIS — Z79899 Other long term (current) drug therapy: Secondary | ICD-10-CM | POA: Diagnosis not present

## 2023-01-31 DIAGNOSIS — M722 Plantar fascial fibromatosis: Secondary | ICD-10-CM | POA: Diagnosis not present

## 2023-01-31 DIAGNOSIS — M797 Fibromyalgia: Secondary | ICD-10-CM | POA: Diagnosis not present

## 2023-01-31 DIAGNOSIS — Z5181 Encounter for therapeutic drug level monitoring: Secondary | ICD-10-CM | POA: Diagnosis not present

## 2023-01-31 DIAGNOSIS — M170 Bilateral primary osteoarthritis of knee: Secondary | ICD-10-CM | POA: Diagnosis not present

## 2023-02-01 DIAGNOSIS — K080 Exfoliation of teeth due to systemic causes: Secondary | ICD-10-CM | POA: Diagnosis not present

## 2023-02-02 ENCOUNTER — Ambulatory Visit (INDEPENDENT_AMBULATORY_CARE_PROVIDER_SITE_OTHER): Payer: Medicare Other | Admitting: Surgery

## 2023-02-02 ENCOUNTER — Encounter: Payer: Self-pay | Admitting: Surgery

## 2023-02-02 VITALS — BP 114/80 | HR 80 | Temp 98.0°F | Ht 66.0 in | Wt 209.0 lb

## 2023-02-02 DIAGNOSIS — Z09 Encounter for follow-up examination after completed treatment for conditions other than malignant neoplasm: Secondary | ICD-10-CM

## 2023-02-02 DIAGNOSIS — K429 Umbilical hernia without obstruction or gangrene: Secondary | ICD-10-CM | POA: Diagnosis not present

## 2023-02-02 DIAGNOSIS — K439 Ventral hernia without obstruction or gangrene: Secondary | ICD-10-CM | POA: Diagnosis not present

## 2023-02-02 NOTE — Progress Notes (Signed)
02/02/2023  History of Present Illness: MIKIYAH Nichols is a 62 y.o. female status post robotic assisted ventral hernia repair on 11/25/2022 for an umbilical and supraumbilical hernia.  The patient presents today because of pain that she was having to the left of midline and near the left upper quadrant.  The patient reports that most recently she had to do more exertion by lifting 50 pound bags of horse food and she felt a tearing sensation as if a knife was going through in her abdominal wall.  She reports that the pain has been improving but there is still discomfort with exertion.  She had been waiting the full 6 weeks after her surgery to resume more normal activities and denies doing anything strenuous before hand.  Denies any issues with the incisions themselves and there is no concern for dehiscence.  Denies any bulging sensations in the midline.  Past Medical History: Past Medical History:  Diagnosis Date   ACL (anterior cruciate ligament) tear 10/21/2015   Anemia    Anxiety    Asthma    BRCA negative 07/2021   MyRisk neg   Complication of anesthesia    woke up during cataract surgery   Depression    Family history of adverse reaction to anesthesia    sister awareness under anesthesia   Family history of breast cancer    Fibrocystic breast changes    Fibromyalgia    GERD (gastroesophageal reflux disease)    Headache    History of cervical dysplasia    Hypertension    Idiopathic insomnia    Increased risk of breast cancer 07/2021   IBIS=14.5%/riskscore=23.7%   Motion sickness    cars, roller coasters   Osteoarthritis    Pneumonia    Polyp of sigmoid colon    Refusal of blood transfusions as patient is Jehovah's Witness    Rosacea    Urge incontinence      Past Surgical History: Past Surgical History:  Procedure Laterality Date   arm surgery Left 2018   reduction osteotomy   BACK SURGERY     BILATERAL CARPAL TUNNEL RELEASE Bilateral 2017   CATARACT EXTRACTION W/PHACO  Right 04/16/2019   Procedure: CATARACT EXTRACTION PHACO AND INTRAOCULAR LENS PLACEMENT (IOC) RIGHT SYMFONY LENS 00:31.0  9.5%  3.03;  Surgeon: Nevada Crane, MD;  Location: [facility];  Service: Ophthalmology;  Laterality: Right;  Latex   CATARACT EXTRACTION W/PHACO Left 05/14/2019   Procedure: CATARACT EXTRACTION PHACO AND INTRAOCULAR LENS PLACEMENT (IOC) LEFT SYMFONY LENS, 0.25, 00:04.9;  Surgeon: Nevada Crane, MD;  Location: [facility];  Service: Ophthalmology;  Laterality: Left;   COLONOSCOPY WITH PROPOFOL N/A 12/16/2016   Procedure: COLONOSCOPY WITH PROPOFOL;  Surgeon: Midge Minium, MD;  Location: [facility];  Service: Endoscopy;  Laterality: N/A;   HAND SURGERY Left 2008   trauma surgery after dog bite   INSERTION OF MESH N/A 11/25/2022   Procedure: INSERTION OF MESH;  Surgeon: Henrene Dodge, MD;  Location: [facility] OR[facility];  Service: General;  Laterality: N/A;   KNEE ARTHROSCOPY     LAMINECTOMY  1992   MENISCUS REPAIR Right 01/2014   POLYPECTOMY  12/16/2016   Procedure: POLYPECTOMY INTESTINAL;  Surgeon: Midge Minium, MD;  Location: [facility];  Service: Endoscopy;;   TOTAL ABDOMINAL HYSTERECTOMY  2011   total with BSO by patient report   XI ROBOTIC ASSISTED VENTRAL HERNIA N/A 11/25/2022   Procedure: XI ROBOTIC ASSISTED VENTRAL HERNIA, incarcerated;  Surgeon: Henrene Dodge, MD;  Location: [facility] OR[facility];  Service: General;  Laterality: N/A;    Home Medications: Prior to Admission medications   Medication Sig Start Date End Date Taking? Authorizing Provider  acetaminophen (TYLENOL) 500 MG tablet Take 2 tablets (1,000 mg total) by mouth every 6 (six) hours as needed for mild pain. 11/25/22  Yes Brylynn Hanssen, Elita Quick, MD  albuterol (VENTOLIN HFA) 108 (90 Base) MCG/ACT inhaler Inhale 2 puffs into the lungs every 6 (six) hours as needed for wheezing or shortness of breath. 09/29/20  Yes Reubin Milan, MD  ALPRAZolam Prudy Feeler) 0.25 MG tablet Take 1 tablet (0.25 mg  total) by mouth daily as needed for anxiety. 01/22/22  Yes Reubin Milan, MD  conjugated estrogens (PREMARIN) vaginal cream Insert 1 g vaginally once weekly as maintenance 10/05/22  Yes Reubin Milan, MD  cyanocobalamin (VITAMIN B12) 1000 MCG tablet Take 1,000 mcg by mouth as needed.   Yes [provider]  diclofenac (VOLTAREN) 75 MG EC tablet Take 75 mg by mouth 2 (two) times daily as needed. 08/30/20  Yes [provider]  diclofenac Sodium (VOLTAREN) 1 % GEL Apply topically as needed.   Yes [provider]  diphenhydrAMINE (BENADRYL) 50 MG capsule Take 50 mg by mouth every 6 (six) hours as needed.   Yes [provider]  estradiol (CLIMARA - DOSED IN MG/24 HR) 0.05 mg/24hr patch Place 1 patch (0.05 mg total) onto the skin once a week. 10/05/22  Yes Reubin Milan, MD  ibuprofen (ADVIL) 800 MG tablet Take 1 tablet (800 mg total) by mouth every 8 (eight) hours as needed for moderate pain. 11/25/22  Yes Jean Alejos, Elita Quick, MD  lansoprazole (PREVACID) 30 MG capsule Take 1 capsule (30 mg total) by mouth daily at 12 noon. 12/07/22  Yes Reubin Milan, MD  lisinopril-hydrochlorothiazide (ZESTORETIC) 20-12.5 MG tablet Take 1 tablet by mouth daily. 12/07/22  Yes Reubin Milan, MD  metaxalone (SKELAXIN) 800 MG tablet Take 800 mg by mouth daily. 1/2 at bedtime and as needed 06/08/21  Yes [provider]  neomycin-polymyxin b-dexamethasone (MAXITROL) 3.5-10000-0.1 SUSP Place 2 drops into both eyes every 6 (six) hours. 12/13/22  Yes Reubin Milan, MD  oxyCODONE (ROXICODONE) 5 MG immediate release tablet Take 1 tablet (5 mg total) by mouth every 4 (four) hours as needed for severe pain. 12/06/22  Yes Willena Jeancharles, Elita Quick, MD  Probiotic Product (PROBIOTIC DAILY PO) Take by mouth daily.   Yes [provider]  Pseudoephedrine-APAP-DM (DAYQUIL PO) Take by mouth as needed.   Yes [provider]  SUMAtriptan (IMITREX) 100 MG tablet Take 1 tablet (100 mg total) by  mouth daily as needed. 12/07/22  Yes Reubin Milan, MD  zaleplon (SONATA) 5 MG capsule Take 5 mg by mouth at bedtime. 01/31/23  Yes [provider]    Allergies: Allergies  Allergen Reactions   Aspirin Shortness Of Breath   Hydrocodone Shortness Of Breath   Ambien [Zolpidem]     Suicidal, also caused vision issues   Bupropion Hcl     intolerance   Divalproex Sodium Er Nausea And Vomiting   Duloxetine Hcl     intolerance   Escitalopram     intolerance   Macrobid [Nitrofurantoin]     Severe abdominal pain   Morphine Sulfate Itching   Naproxen     delusions   Other Swelling    Beer and liquor but not sure what the main cause is   Oxycodone Hcl Nausea And Vomiting    Can  only take the Mallincrodt manufactured    Ampicillin Rash   Codeine Itching and Rash        Epinephrine Anxiety   Latex Rash    Gloves - when worn for extended period   Oxybutynin Other (See Comments)    Headache   Sulfa Antibiotics Rash    Review of Systems: Review of Systems  Constitutional:  Negative for chills and fever.  Respiratory:  Negative for shortness of breath.   Cardiovascular:  Negative for chest pain.  Gastrointestinal:  Positive for abdominal pain. Negative for nausea and vomiting.    Physical Exam BP 114/80   Pulse 80   Temp 98 F (36.7 C)   Ht 5\' 6"  (1.676 m)   Wt 209 lb (94.8 kg)   SpO2 97%   BMI 33.73 kg/m  CONSTITUTIONAL: No acute distress HEENT:  Normocephalic, atraumatic, extraocular motion intact. RESPIRATORY:  Normal respiratory effort without pathologic use of accessory muscles. CARDIOVASCULAR: Regular rhythm and rate. GI: The abdomen is soft, nondistended, with localized tenderness to palpation to the left of the umbilicus going towards the left upper quadrant.  On exam, there is no evidence of hernia recurrence or any bulging sensation.  The 3 laparoscopic incisions are well-healed.  NEUROLOGIC:  Motor and sensation is grossly normal.  Cranial nerves are  grossly intact. PSYCH:  Alert and oriented to person, place and time. Affect is normal.   Assessment and Plan: This is a 62 y.o. female status post robotic assisted ventral hernia repair.  - Discussed with patient that the pain that she had most likely is related to a tear with the sutures that were used.  Based on the location of her discomfort, this may be more with the sutures used to secure the mesh in place.  However this happened more than 6 weeks after her surgery and I think on exam there is no evidence of hernia recurrence.  It would be unlikely that the mesh is displaced.  Most likely the pain is just related to the inflammatory response from this event, perhaps a small hematoma.  This will resolve on its own.  Discussed with patient that she can use ibuprofen and Tylenol and also alternate between heat and ice over the area of discomfort.  Instructed her to take it easy with weight issues over the next couple of weeks to allow for this to heal better. - Follow-up as needed but gave the patient return precautions particular of the pain has not improved over the next month.  If that were to be the case, then most likely we will order imaging study to evaluate her hernia repair.  I spent 20 minutes dedicated to the care of this patient on the date of this encounter to include pre-visit review of records, face-to-face time with the patient discussing diagnosis and management, and any post-visit coordination of care.   Howie Ill, MD [facility]

## 2023-02-02 NOTE — Patient Instructions (Addendum)
You may use ice or heat to the left side, 15 minutes at a time. You may also use ibuprofen three times a day for the next week to help reduce any inflammation.  If you do not have any improvement in the next month please let us know.     Follow-up with our office as needed.  Please call and ask to speak with a nurse if you develop questions or concerns.

## 2023-02-03 ENCOUNTER — Other Ambulatory Visit: Payer: Self-pay | Admitting: Internal Medicine

## 2023-02-03 DIAGNOSIS — J4520 Mild intermittent asthma, uncomplicated: Secondary | ICD-10-CM

## 2023-02-03 NOTE — Telephone Encounter (Signed)
Medication Refill - Medication: albuterol (VENTOLIN HFA) 108 (90 Base) MCG/ACT inhaler [[id number]]   Has the patient contacted their pharmacy? Yes.     (Agent: If yes, when and what did the pharmacy advise?) Contact PCP    Preferred Pharmacy (with phone number or street name): [facility] [place] [facility]    Has the patient been seen for an appointment in the last year OR does the patient have an upcoming appointment? Yes.     Agent: Please be advised that RX refills may take up to 3 business days. We ask that you follow-up with your pharmacy.

## 2023-02-04 ENCOUNTER — Other Ambulatory Visit: Payer: Self-pay | Admitting: Internal Medicine

## 2023-02-04 DIAGNOSIS — J4520 Mild intermittent asthma, uncomplicated: Secondary | ICD-10-CM

## 2023-02-04 MED ORDER — ALBUTEROL SULFATE HFA 108 (90 BASE) MCG/ACT IN AERS: 2.0000 | INHALATION_SPRAY | Freq: Four times a day (QID) | RESPIRATORY_TRACT | 0 refills | Status: DC | PRN

## 2023-02-04 NOTE — Telephone Encounter (Signed)
Requested medication (s) are due for refill today: Yes  Requested medication (s) are on the active medication list: Yes  Last refill:  09/29/20  Future visit scheduled: No  Notes to clinic:  Expired.    Requested Prescriptions  Pending Prescriptions Disp Refills   albuterol (VENTOLIN HFA) 108 (90 Base) MCG/ACT inhaler 18 g 5    Sig: Inhale 2 puffs into the lungs every 6 (six) hours as needed for wheezing or shortness of breath.     Pulmonology:  Beta Agonists 2 Passed - 02/04/2023  9:43 AM      Passed - Last BP in normal range    BP Readings from Last 1 Encounters:  02/02/23 114/80         Passed - Last Heart Rate in normal range    Pulse Readings from Last 1 Encounters:  02/02/23 80         Passed - Valid encounter within last 12 months    Recent Outpatient Visits           1 month ago Acute bacterial conjunctivitis of both eyes   [place] [facility], Nyoka Cowden, MD   1 month ago Annual physical exam   [facility] at [facility], Nyoka Cowden, MD   4 months ago Essential (primary) hypertension   [place] [facility] at [facility], Nyoka Cowden, MD   4 months ago Periumbilical abdominal pain   [place] [facility], [facility]   1 year ago Asthma in adult, mild intermittent, with acute exacerbation   [facility] at [facility], Nyoka Cowden, MD

## 2023-02-04 NOTE — Telephone Encounter (Signed)
No end date on Rx- hx asthma- will RF Requested Prescriptions  Pending Prescriptions Disp Refills   albuterol (VENTOLIN HFA) 108 (90 Base) MCG/ACT inhaler 18 g 5    Sig: Inhale 2 puffs into the lungs every 6 (six) hours as needed for wheezing or shortness of breath.     Pulmonology:  Beta Agonists 2 Passed - 02/04/2023  2:17 PM      Passed - Last BP in normal range    BP Readings from Last 1 Encounters:  02/02/23 114/80         Passed - Last Heart Rate in normal range    Pulse Readings from Last 1 Encounters:  02/02/23 80         Passed - Valid encounter within last 12 months    Recent Outpatient Visits           1 month ago Acute bacterial conjunctivitis of both eyes   [place] [facility] at [facility], Nyoka Cowden, MD   1 month ago Annual physical exam   [facility] at [facility], Nyoka Cowden, MD   4 months ago Essential (primary) hypertension   [place] [facility] at [facility], Nyoka Cowden, MD   4 months ago Periumbilical abdominal pain   [place] [facility] at [facility], MD   1 year ago Asthma in adult, mild intermittent, with acute exacerbation   [facility] at [facility], Nyoka Cowden, MD

## 2023-02-04 NOTE — Telephone Encounter (Signed)
Medication Refill - Medication:  albuterol (VENTOLIN HFA) 108 (90 Base) MCG/ACT inhaler  Has the patient contacted their pharmacy? Yes.   Pt has not filled this since 2022.  Preferred Pharmacy (with phone number or street name): [id number] [place], [place] - [address]  Has the patient been seen for an appointment in the last year OR does the patient have an upcoming appointment? Yes.    Agent: Please be advised that RX refills may take up to 3 business days. We ask that you follow-up with your pharmacy.   Pt states this inhaler would be helpful since allergies are kicking in and this would nice to have.  Pt states she is not having a problem, just likes to have this on hand.  Helps w/ her allergies. And she wants it w/in the next 30 min.  She has to pick her kids up, and cannot be late.

## 2023-02-15 ENCOUNTER — Other Ambulatory Visit: Payer: Self-pay

## 2023-02-15 DIAGNOSIS — R1084 Generalized abdominal pain: Secondary | ICD-10-CM

## 2023-02-18 ENCOUNTER — Ambulatory Visit
Admission: RE | Admit: 2023-02-18 | Discharge: 2023-02-18 | Disposition: A | Discharge status: Home / Self Care | Payer: Medicare Other | Source: Ambulatory Visit | Attending: Surgery | Admitting: Surgery

## 2023-02-18 DIAGNOSIS — Z90710 Acquired absence of both cervix and uterus: Secondary | ICD-10-CM | POA: Diagnosis not present

## 2023-02-18 DIAGNOSIS — R1084 Generalized abdominal pain: Secondary | ICD-10-CM | POA: Diagnosis not present

## 2023-02-18 DIAGNOSIS — R109 Unspecified abdominal pain: Secondary | ICD-10-CM | POA: Diagnosis not present

## 2023-02-18 DIAGNOSIS — K439 Ventral hernia without obstruction or gangrene: Secondary | ICD-10-CM | POA: Diagnosis not present

## 2023-02-18 MED ORDER — IOHEXOL 300 MG/ML  SOLN
100.0000 mL | Freq: Once | INTRAMUSCULAR | Status: AC | PRN
  Administered 2023-02-18: 100 mL via INTRAVENOUS

## 2023-02-28 ENCOUNTER — Telehealth: Payer: Self-pay | Admitting: *Deleted

## 2023-02-28 NOTE — Telephone Encounter (Signed)
Patient called and wants to know her results of the CT scan she had on 02/18/23. Please call and advise

## 2023-03-28 DIAGNOSIS — M76822 Posterior tibial tendinitis, left leg: Secondary | ICD-10-CM | POA: Diagnosis not present

## 2023-03-28 DIAGNOSIS — G894 Chronic pain syndrome: Secondary | ICD-10-CM | POA: Diagnosis not present

## 2023-03-28 DIAGNOSIS — R262 Difficulty in walking, not elsewhere classified: Secondary | ICD-10-CM | POA: Diagnosis not present

## 2023-03-28 DIAGNOSIS — M79671 Pain in right foot: Secondary | ICD-10-CM | POA: Diagnosis not present

## 2023-03-28 DIAGNOSIS — M76821 Posterior tibial tendinitis, right leg: Secondary | ICD-10-CM | POA: Diagnosis not present

## 2023-03-28 DIAGNOSIS — M170 Bilateral primary osteoarthritis of knee: Secondary | ICD-10-CM | POA: Diagnosis not present

## 2023-03-28 DIAGNOSIS — M79672 Pain in left foot: Secondary | ICD-10-CM | POA: Diagnosis not present

## 2023-03-28 DIAGNOSIS — M722 Plantar fascial fibromatosis: Secondary | ICD-10-CM | POA: Diagnosis not present

## 2023-04-26 DIAGNOSIS — H353121 Nonexudative age-related macular degeneration, left eye, early dry stage: Secondary | ICD-10-CM | POA: Diagnosis not present

## 2023-04-26 DIAGNOSIS — H353112 Nonexudative age-related macular degeneration, right eye, intermediate dry stage: Secondary | ICD-10-CM | POA: Diagnosis not present

## 2023-04-26 DIAGNOSIS — L578 Other skin changes due to chronic exposure to nonionizing radiation: Secondary | ICD-10-CM | POA: Diagnosis not present

## 2023-04-26 DIAGNOSIS — Z961 Presence of intraocular lens: Secondary | ICD-10-CM | POA: Diagnosis not present

## 2023-04-26 DIAGNOSIS — H04123 Dry eye syndrome of bilateral lacrimal glands: Secondary | ICD-10-CM | POA: Diagnosis not present

## 2023-04-26 DIAGNOSIS — R234 Changes in skin texture: Secondary | ICD-10-CM | POA: Diagnosis not present

## 2023-04-26 DIAGNOSIS — L98498 Non-pressure chronic ulcer of skin of other sites with other specified severity: Secondary | ICD-10-CM | POA: Diagnosis not present

## 2023-04-26 DIAGNOSIS — D485 Neoplasm of uncertain behavior of skin: Secondary | ICD-10-CM | POA: Diagnosis not present

## 2023-05-02 DIAGNOSIS — R234 Changes in skin texture: Secondary | ICD-10-CM | POA: Diagnosis not present

## 2023-05-02 DIAGNOSIS — L98498 Non-pressure chronic ulcer of skin of other sites with other specified severity: Secondary | ICD-10-CM | POA: Diagnosis not present

## 2023-05-13 DIAGNOSIS — L570 Actinic keratosis: Secondary | ICD-10-CM | POA: Diagnosis not present

## 2023-05-13 DIAGNOSIS — L2089 Other atopic dermatitis: Secondary | ICD-10-CM | POA: Diagnosis not present

## 2023-05-17 DIAGNOSIS — K080 Exfoliation of teeth due to systemic causes: Secondary | ICD-10-CM | POA: Diagnosis not present

## 2023-06-07 DIAGNOSIS — H524 Presbyopia: Secondary | ICD-10-CM | POA: Diagnosis not present

## 2023-06-27 DIAGNOSIS — G894 Chronic pain syndrome: Secondary | ICD-10-CM | POA: Diagnosis not present

## 2023-06-27 DIAGNOSIS — M79671 Pain in right foot: Secondary | ICD-10-CM | POA: Diagnosis not present

## 2023-06-27 DIAGNOSIS — M170 Bilateral primary osteoarthritis of knee: Secondary | ICD-10-CM | POA: Diagnosis not present

## 2023-06-27 DIAGNOSIS — M797 Fibromyalgia: Secondary | ICD-10-CM | POA: Diagnosis not present

## 2023-09-07 DIAGNOSIS — M5416 Radiculopathy, lumbar region: Secondary | ICD-10-CM | POA: Diagnosis not present

## 2023-09-07 DIAGNOSIS — M5412 Radiculopathy, cervical region: Secondary | ICD-10-CM | POA: Diagnosis not present

## 2023-09-07 DIAGNOSIS — M170 Bilateral primary osteoarthritis of knee: Secondary | ICD-10-CM | POA: Diagnosis not present

## 2023-09-07 DIAGNOSIS — G894 Chronic pain syndrome: Secondary | ICD-10-CM | POA: Diagnosis not present

## 2023-09-18 ENCOUNTER — Encounter: Payer: Self-pay | Admitting: Internal Medicine

## 2023-09-22 ENCOUNTER — Ambulatory Visit: Payer: Medicare Other

## 2023-11-04 ENCOUNTER — Other Ambulatory Visit: Payer: Self-pay | Admitting: Internal Medicine

## 2023-11-04 DIAGNOSIS — I10 Essential (primary) hypertension: Secondary | ICD-10-CM

## 2023-11-07 NOTE — Telephone Encounter (Signed)
 Unable to refill per protocol, appointment needed.   Requested Prescriptions  Pending Prescriptions Disp Refills   lisinopril -hydrochlorothiazide  (ZESTORETIC ) 20-12.5 MG tablet [Pharmacy Med Name: LISINOPRIL -HCTZ 20/12.5MG  TABLETS] 90 tablet 1    Sig: TAKE 1 TABLET BY MOUTH EVERY DAY     Cardiovascular:  ACEI + Diuretic Combos Failed - 11/07/2023 11:41 AM      Failed - Na in normal range and within 180 days    Sodium  Date Value Ref Range Status  12/07/2022 136 134 - 144 mmol/L Final         Failed - K in normal range and within 180 days    Potassium  Date Value Ref Range Status  12/07/2022 3.9 3.5 - 5.2 mmol/L Final         Failed - Cr in normal range and within 180 days    Creatinine, Ser  Date Value Ref Range Status  12/07/2022 0.95 0.57 - 1.00 mg/dL Final         Failed - eGFR is 30 or above and within 180 days    GFR calc Af Amer  Date Value Ref Range Status  11/20/2019 96 >59 mL/min/1.73 Final    Comment:    **[facility] currently reports eGFR in compliance with the current**   recommendations of the [employer]. [facility] will   update reporting as new guidelines are published from the NKF-ASN   Task force.    GFR calc non Af Amer  Date Value Ref Range Status  11/20/2019 83 >59 mL/min/1.73 Final   eGFR  Date Value Ref Range Status  12/07/2022 69 >59 mL/min/1.73 Final         Failed - Valid encounter within last 6 months    Recent Outpatient Visits   None            Passed - Patient is not pregnant      Passed - Last BP in normal range    BP Readings from Last 1 Encounters:  02/02/23 114/80

## 2023-11-14 ENCOUNTER — Other Ambulatory Visit: Payer: Self-pay | Admitting: Internal Medicine

## 2023-11-14 DIAGNOSIS — G43009 Migraine without aura, not intractable, without status migrainosus: Secondary | ICD-10-CM

## 2023-11-14 NOTE — Telephone Encounter (Signed)
 Pt is leaving out of town tomorrow and is hoping to receive this sooner.   Copied from CRM [id number]. Topic: Clinical - Medication Refill >> Nov 14, 2023 10:25 AM [name] wrote: Medication: SUMAtriptan  (IMITREX ) 100 MG tablet  Has the patient contacted their pharmacy? Yes (Agent: If no, request that the patient contact the pharmacy for the refill. If patient does not wish to contact the pharmacy document the reason why and proceed with request.) (Agent: If yes, when and what did the pharmacy advise?)  This is the patient's preferred pharmacy:   [id number], [place] - [address] AT [facility] OF [address] [place] [address] Phone: [phone] Fax: [phone]  Is this the correct pharmacy for this prescription? Yes If no, delete pharmacy and type the correct one.   Has the prescription been filled recently? Yes  Is the patient out of the medication? Yes  Has the patient been seen for an appointment in the last year OR does the patient have an upcoming appointment? Yes  Can we respond through MyChart? Yes  Agent: Please be advised that Rx refills may take up to 3 business days. We ask that you follow-up with your pharmacy.

## 2023-11-14 NOTE — Telephone Encounter (Signed)
Pt needs an appt.  KP

## 2023-11-14 NOTE — Telephone Encounter (Signed)
 Requested medication (s) are due for refill today: yes  Requested medication (s) are on the active medication list: yes  Last refill:  12/07/22 #9/5  Future visit scheduled: no  Notes to clinic:  LOV 12/06/22, pt going out of town and requesting refill.      Requested Prescriptions  Pending Prescriptions Disp Refills   SUMAtriptan  (IMITREX ) 100 MG tablet 9 tablet 5    Sig: Take 1 tablet (100 mg total) by mouth daily as needed.     Neurology:  Migraine Therapy - Triptan Failed - 11/14/2023  2:52 PM      Failed - Valid encounter within last 12 months    Recent Outpatient Visits   None            Passed - Last BP in normal range    BP Readings from Last 1 Encounters:  02/02/23 114/80

## 2023-11-15 MED ORDER — SUMATRIPTAN SUCCINATE 100 MG PO TABS
100.0000 mg | ORAL_TABLET | Freq: Every day | ORAL | 5 refills | Status: DC | PRN
Start: 2023-11-15 — End: 2024-01-17

## 2023-11-22 DIAGNOSIS — Z961 Presence of intraocular lens: Secondary | ICD-10-CM | POA: Diagnosis not present

## 2023-11-22 DIAGNOSIS — T8529XA Other mechanical complication of intraocular lens, initial encounter: Secondary | ICD-10-CM | POA: Diagnosis not present

## 2023-11-22 DIAGNOSIS — H353112 Nonexudative age-related macular degeneration, right eye, intermediate dry stage: Secondary | ICD-10-CM | POA: Diagnosis not present

## 2023-11-22 DIAGNOSIS — H353121 Nonexudative age-related macular degeneration, left eye, early dry stage: Secondary | ICD-10-CM | POA: Diagnosis not present

## 2023-11-23 ENCOUNTER — Other Ambulatory Visit: Payer: Self-pay | Admitting: Internal Medicine

## 2023-11-23 DIAGNOSIS — Z8711 Personal history of peptic ulcer disease: Secondary | ICD-10-CM

## 2023-11-25 NOTE — Telephone Encounter (Signed)
 Requested Prescriptions  Pending Prescriptions Disp Refills   lansoprazole  (PREVACID ) 30 MG capsule [Pharmacy Med Name: LANSOPRAZOLE  30MG  DR CAPSULES] 90 capsule 0    Sig: TAKE 1 CAPSULE BY MOUTH DAILY AT 12 NOON     Gastroenterology: Proton Pump Inhibitors 2 Failed - 11/25/2023  2:16 PM      Failed - Valid encounter within last 12 months    Recent Outpatient Visits   None            Passed - ALT in normal range and within 360 days    ALT  Date Value Ref Range Status  12/07/2022 13 0 - 32 IU/L Final         Passed - AST in normal range and within 360 days    AST  Date Value Ref Range Status  12/07/2022 16 0 - 40 IU/L Final

## 2023-12-02 DIAGNOSIS — M5416 Radiculopathy, lumbar region: Secondary | ICD-10-CM | POA: Diagnosis not present

## 2023-12-02 DIAGNOSIS — M797 Fibromyalgia: Secondary | ICD-10-CM | POA: Diagnosis not present

## 2023-12-02 DIAGNOSIS — M5412 Radiculopathy, cervical region: Secondary | ICD-10-CM | POA: Diagnosis not present

## 2023-12-02 DIAGNOSIS — G8929 Other chronic pain: Secondary | ICD-10-CM | POA: Diagnosis not present

## 2023-12-05 ENCOUNTER — Encounter: Payer: Self-pay | Admitting: Internal Medicine

## 2023-12-05 ENCOUNTER — Ambulatory Visit (INDEPENDENT_AMBULATORY_CARE_PROVIDER_SITE_OTHER): Admitting: Internal Medicine

## 2023-12-05 ENCOUNTER — Telehealth: Payer: Self-pay | Admitting: Internal Medicine

## 2023-12-05 ENCOUNTER — Ambulatory Visit: Payer: Self-pay | Admitting: Internal Medicine

## 2023-12-05 VITALS — BP 144/86 | HR 77 | Ht 66.0 in | Wt 200.0 lb

## 2023-12-05 DIAGNOSIS — I10 Essential (primary) hypertension: Secondary | ICD-10-CM

## 2023-12-05 DIAGNOSIS — M5090 Cervical disc disorder, unspecified, unspecified cervical region: Secondary | ICD-10-CM | POA: Insufficient documentation

## 2023-12-05 DIAGNOSIS — G894 Chronic pain syndrome: Secondary | ICD-10-CM | POA: Insufficient documentation

## 2023-12-05 DIAGNOSIS — M5136 Other intervertebral disc degeneration, lumbar region with discogenic back pain only: Secondary | ICD-10-CM | POA: Insufficient documentation

## 2023-12-05 MED ORDER — LISINOPRIL-HYDROCHLOROTHIAZIDE 20-12.5 MG PO TABS: 2.0000 | ORAL_TABLET | Freq: Every day | ORAL | 0 refills | Status: DC

## 2023-12-05 NOTE — Assessment & Plan Note (Addendum)
 Blood pressure is not well controlled due to multiple stressors, chronic pain, etc Current medications are lisinopril  hct.   Recommend increasing dose to 40/25.  New Rx sent Recheck in 4 weeks.

## 2023-12-05 NOTE — Telephone Encounter (Signed)
 Copied from CRM [id number]. Topic: General - Running Late >> Dec 05, 2023  3:48 PM DeAngela L wrote: Patient/patient representative is calling because they are running late for an appointment.

## 2023-12-05 NOTE — Assessment & Plan Note (Signed)
 Has failed multiple antidepressants, gabapentin , Lyrica, etc Currently on oxycodone 

## 2023-12-05 NOTE — Telephone Encounter (Signed)
 Noted  KP

## 2023-12-05 NOTE — Progress Notes (Signed)
 Date:  12/05/2023   Name:  Alicia Nichols   DOB:  February 23, 1961   MRN:  [id number]   Chief Complaint: Hypertension and Urticaria (Pt started having hives all over body for X3 weeks. Pt thinks it may be from alcohol use. Benadryl helps. )  Hypertension This is a chronic problem. The problem has been rapidly worsening since onset. The problem is uncontrolled. Associated symptoms include blurred vision, headaches and sweats. Pertinent negatives include no chest pain, palpitations, peripheral edema or shortness of breath. There are no associated agents to hypertension. Past treatments include ACE inhibitors and diuretics. The current treatment provides moderate improvement. There is no history of kidney disease, CAD/MI or CVA.    Review of Systems  Constitutional:  Negative for chills, fatigue and fever.  Eyes:  Positive for blurred vision.  Respiratory:  Negative for chest tightness, shortness of breath and wheezing.   Cardiovascular:  Negative for chest pain and palpitations.  Musculoskeletal:  Positive for arthralgias and back pain.  Neurological:  Positive for dizziness and headaches. Negative for light-headedness and numbness.  Psychiatric/Behavioral:  Positive for dysphoric mood. Negative for sleep disturbance. The patient is nervous/anxious.      Lab Results  Component Value Date   NA 136 12/07/2022   K 3.9 12/07/2022   CO2 24 12/07/2022   GLUCOSE 90 12/07/2022   BUN 18 12/07/2022   CREATININE 0.95 12/07/2022   CALCIUM 9.3 12/07/2022   EGFR 69 12/07/2022   GFRNONAA 83 11/20/2019   Lab Results  Component Value Date   CHOL 242 (H) 12/07/2022   HDL 51 12/07/2022   LDLCALC 160 (H) 12/07/2022   TRIG 172 (H) 12/07/2022   CHOLHDL 4.7 (H) 12/07/2022   Lab Results  Component Value Date   TSH 1.430 12/07/2022   Lab Results  Component Value Date   HGBA1C 5.2 12/01/2021   Lab Results  Component Value Date   WBC 4.7 12/07/2022   HGB 11.7 12/07/2022   HCT 35.5 12/07/2022    MCV 87 12/07/2022   PLT 392 12/07/2022   Lab Results  Component Value Date   ALT 13 12/07/2022   AST 16 12/07/2022   ALKPHOS 92 12/07/2022   BILITOT 0.2 12/07/2022   No results found for: MARIEN BOLLS, VD25OH   Patient Active Problem List   Diagnosis Date Noted   Cervical disc disease 12/05/2023   Degeneration of intervertebral disc of lumbar region with discogenic back pain 12/05/2023   Chronic pain syndrome 12/05/2023   Refusal of blood transfusions as patient is Jehovah's Witness 10/05/2022   Pruritic condition 10/05/2022   Vaginal atrophy 10/05/2022   Generalized anxiety disorder 01/22/2022   Dyspareunia in female 06/30/2021   Chronic idiopathic constipation 11/20/2019   Drug-induced constipation 11/10/2017   Polyp of sigmoid colon    Hyperlipidemia, mild 11/10/2016   Rosacea 10/21/2015   Fibrocystic breast changes 06/06/2015   Essential (primary) hypertension 12/03/2014   Urge incontinence 12/03/2014   H/O peptic ulcer 12/03/2014   Fibromyalgia 12/03/2014   Migraine without aura and without status migrainosus, not intractable 12/03/2014   Mild intermittent asthma without complication 12/03/2014   Idiopathic insomnia 12/03/2014    Allergies  Allergen Reactions   Aspirin Shortness Of Breath   Hydrocodone Shortness Of Breath   Ambien [Zolpidem]     Suicidal, also caused vision issues   Bupropion Hcl     intolerance   Divalproex Sodium Er Nausea And Vomiting   Duloxetine Hcl     intolerance  Escitalopram     intolerance   Macrobid  [Nitrofurantoin ]     Severe abdominal pain   Morphine Sulfate Itching   Naproxen     delusions   Other Swelling    Beer and liquor but not sure what the main cause is   Oxycodone  Hcl Nausea And Vomiting    Can only take the [place] manufactured    Ampicillin Rash   Codeine  Itching and Rash        Epinephrine  Anxiety   Latex Rash    Gloves - when worn for extended period   Oxybutynin  Other (See Comments)     Headache   Sulfa Antibiotics Rash    Past Surgical History:  Procedure Laterality Date   arm surgery Left 2018   reduction osteotomy   BACK SURGERY     BILATERAL CARPAL TUNNEL RELEASE Bilateral 2017   CATARACT EXTRACTION W/PHACO Right 04/16/2019   Procedure: CATARACT EXTRACTION PHACO AND INTRAOCULAR LENS PLACEMENT (IOC) RIGHT SYMFONY LENS 00:31.0  9.5%  3.03;  Surgeon: Myrna Adine Anes, MD;  Location: [facility];  Service: Ophthalmology;  Laterality: Right;  Latex   CATARACT EXTRACTION W/PHACO Left 05/14/2019   Procedure: CATARACT EXTRACTION PHACO AND INTRAOCULAR LENS PLACEMENT (IOC) LEFT SYMFONY LENS, 0.25, 00:04.9;  Surgeon: Myrna Adine Anes, MD;  Location: [facility];  Service: Ophthalmology;  Laterality: Left;   COLONOSCOPY WITH PROPOFOL  N/A 12/16/2016   Procedure: COLONOSCOPY WITH PROPOFOL ;  Surgeon: Jinny Carmine, MD;  Location: [facility];  Service: Endoscopy;  Laterality: N/A;   HAND SURGERY Left 2008   trauma surgery after dog bite   INSERTION OF MESH N/A 11/25/2022   Procedure: INSERTION OF MESH;  Surgeon: Desiderio Schanz, MD;  Location: [facility];  Service: General;  Laterality: N/A;   KNEE ARTHROSCOPY     LAMINECTOMY  1992   MENISCUS REPAIR Right 01/2014   POLYPECTOMY  12/16/2016   Procedure: POLYPECTOMY INTESTINAL;  Surgeon: Jinny Carmine, MD;  Location: [facility];  Service: Endoscopy;;   TOTAL ABDOMINAL HYSTERECTOMY  2011   total with BSO by patient report   XI ROBOTIC ASSISTED VENTRAL HERNIA N/A 11/25/2022   Procedure: XI ROBOTIC ASSISTED VENTRAL HERNIA, incarcerated;  Surgeon: Desiderio Schanz, MD;  Location: [facility];  Service: General;  Laterality: N/A;    Social History   Tobacco Use   Smoking status: Former    Current packs/day: 0.00    Types: Cigarettes    Start date: 39    Quit date: 1988    Years since quitting: 37.5    Passive exposure: Past   Smokeless tobacco: Never  Vaping Use   Vaping status: Never Used   Substance Use Topics   Alcohol use: Yes    Alcohol/week: 0.0 standard drinks of alcohol    Comment: may have a drink several times per year   Drug use: No     Medication list has been reviewed and updated.  Current Meds  Medication Sig   acetaminophen  (TYLENOL ) 500 MG tablet Take 2 tablets (1,000 mg total) by mouth every 6 (six) hours as needed for mild pain.   albuterol  (VENTOLIN  HFA) 108 (90 Base) MCG/ACT inhaler Inhale 2 puffs into the lungs every 6 (six) hours as needed for wheezing or shortness of breath.   cyanocobalamin (VITAMIN B12) 1000 MCG tablet Take 1,000 mcg by mouth as needed.   diclofenac (VOLTAREN) 75 MG EC tablet Take 75 mg by mouth 2 (two) times daily as needed.   diclofenac Sodium (VOLTAREN) 1 %  GEL Apply topically as needed.   diphenhydrAMINE (BENADRYL) 50 MG capsule Take 50 mg by mouth every 6 (six) hours as needed.   ibuprofen  (ADVIL ) 800 MG tablet Take 1 tablet (800 mg total) by mouth every 8 (eight) hours as needed for moderate pain.   lansoprazole  (PREVACID ) 30 MG capsule TAKE 1 CAPSULE BY MOUTH DAILY AT 12 NOON   metaxalone (SKELAXIN) 800 MG tablet Take 800 mg by mouth daily. 1/2 at bedtime and as needed   Probiotic Product (PROBIOTIC DAILY PO) Take by mouth daily.   Pseudoephedrine -APAP-DM (DAYQUIL PO) Take by mouth as needed.   SUMAtriptan  (IMITREX ) 100 MG tablet Take 1 tablet (100 mg total) by mouth daily as needed.   [DISCONTINUED] lisinopril -hydrochlorothiazide  (ZESTORETIC ) 20-12.5 MG tablet Take 1 tablet by mouth daily.       12/05/2023    4:16 PM 12/13/2022    1:42 PM 12/07/2022   10:14 AM 10/05/2022    1:21 PM  GAD 7 : Generalized Anxiety Score  Nervous, Anxious, on Edge 1 0 2 1  Control/stop worrying 0 0 0 0  Worry too much - different things 0 0 1 3  Trouble relaxing 1 0 3 2  Restless 0 3 2 1   Easily annoyed or irritable 1 0 0 1  Afraid - awful might happen 0 0 0 0  Total GAD 7 Score 3 3 8 8   Anxiety Difficulty Not difficult at all Not  difficult at all Not difficult at all Very difficult       12/05/2023    4:13 PM 12/13/2022    1:41 PM 12/07/2022   10:14 AM  Depression screen PHQ 2/9  Decreased Interest 0 2 3  Down, Depressed, Hopeless 1 2 0  PHQ - 2 Score 1 4 3   Altered sleeping 1 2 2   Tired, decreased energy 2 2 3   Change in appetite 1 2 2   Feeling bad or failure about yourself  0 0 0  Trouble concentrating 0 3 3  Moving slowly or fidgety/restless 0 1 2  Suicidal thoughts 0 0 0  PHQ-9 Score 5 14 15   Difficult doing work/chores Somewhat difficult Somewhat difficult Extremely dIfficult    BP Readings from Last 3 Encounters:  12/05/23 (!) 144/86  02/02/23 114/80  01/05/23 (!) 146/85    Physical Exam Constitutional:      Appearance: Normal appearance.   Cardiovascular:     Rate and Rhythm: Normal rate and regular rhythm.     Heart sounds: No murmur heard. Pulmonary:     Effort: No respiratory distress.     Breath sounds: No wheezing or rhonchi.   Musculoskeletal:        General: No swelling.     Cervical back: Normal range of motion.     Right lower leg: No edema.     Left lower leg: No edema.   Skin:    General: Skin is warm and dry.   Neurological:     General: No focal deficit present.     Mental Status: She is alert.   Psychiatric:        Attention and Perception: Attention normal.        Mood and Affect: Affect is flat.        Speech: Speech normal.        Thought Content: Thought content normal.     Wt Readings from Last 3 Encounters:  12/05/23 200 lb (90.7 kg)  02/02/23 209 lb (94.8 kg)  01/05/23 207 lb  12.8 oz (94.3 kg)    BP (!) 144/86   Pulse 77   Ht 5' 6 (1.676 m)   Wt 200 lb (90.7 kg)   SpO2 96%   BMI 32.28 kg/m   Assessment and Plan:  Problem List Items Addressed This Visit       Unprioritized   Essential (primary) hypertension - Primary (Chronic)   Blood pressure is not well controlled due to multiple stressors, chronic pain, etc Current medications are  lisinopril  hct.   Recommend increasing dose to 40/25.  New Rx sent Recheck in 4 weeks.      Relevant Medications   lisinopril -hydrochlorothiazide  (ZESTORETIC ) 20-12.5 MG tablet   Cervical disc disease   Degeneration of intervertebral disc of lumbar region with discogenic back pain   Followed by pain management Has seen [facility] - fusion recommended but declined She takes oxycodone  3-4 per day      Chronic pain syndrome   Has failed multiple antidepressants, gabapentin , Lyrica, etc Currently on oxycodone         Return in about 1 month (around 01/04/2024) for HTN.    Leita HILARIO Adie, MD [facility] Primary Care [facility] Medicine [place]

## 2023-12-05 NOTE — Assessment & Plan Note (Signed)
 Followed by pain management Has seen [facility] - fusion recommended but declined She takes oxycodone  3-4 per day

## 2023-12-05 NOTE — Patient Instructions (Signed)
 Take 2 of the lisinopril -hct tabs daily.  Call for a new Rx when needed.

## 2023-12-05 NOTE — Telephone Encounter (Signed)
 FYI Only or Action Required?: FYI only for provider.  Patient was last seen in primary care on 12/13/2022 by [name], MD. Called Nurse Triage reporting Hypertension. Symptoms began yesterday. Interventions attempted: Nothing. Symptoms are: stable.  Triage Disposition: See PCP When Office is Open (Within 3 Days)  Patient/caregiver understands and will follow disposition?: Yes                Copied from CRM [id number]. Topic: Clinical - Red Word Triage >> Dec 05, 2023  2:37 PM Turkey B wrote: [facility] that prompted transfer to Nurse Triage: pt had blurry vision, dizziness yesterday and bp was 160/90 something pt says Reason for Disposition  [1] Taking BP medications AND [2] feels is having side effects (e.g., impotence, cough, dizzy upon standing)  Answer Assessment - Initial Assessment Questions BLOOD PRESSURE: What is the blood pressure? Did you take at least two measurements 5 minutes apart?     160/90 yesterday ONSET: When did you take your blood pressure?     Yesterday; needs pt's husband to take it HISTORY: Do you have a history of high blood pressure?     Yes, usually under control MEDICINES: Are you taking any medicines for blood pressure? Have you missed any doses recently?     Lisinopril ; might have missed the dose the night before OTHER SYMPTOMS: Do you have any symptoms? (e.g., blurred vision, chest pain, difficulty breathing, headache, weakness)     Dizziness and blurry vision yesterday; feels fine now  Protocols used: Blood Pressure - High-A-AH

## 2023-12-08 DIAGNOSIS — G8929 Other chronic pain: Secondary | ICD-10-CM | POA: Diagnosis not present

## 2023-12-08 DIAGNOSIS — M25561 Pain in right knee: Secondary | ICD-10-CM | POA: Diagnosis not present

## 2023-12-08 DIAGNOSIS — R600 Localized edema: Secondary | ICD-10-CM | POA: Diagnosis not present

## 2023-12-08 DIAGNOSIS — M25571 Pain in right ankle and joints of right foot: Secondary | ICD-10-CM | POA: Diagnosis not present

## 2023-12-15 DIAGNOSIS — G8929 Other chronic pain: Secondary | ICD-10-CM | POA: Diagnosis not present

## 2023-12-15 DIAGNOSIS — R600 Localized edema: Secondary | ICD-10-CM | POA: Diagnosis not present

## 2023-12-15 DIAGNOSIS — M25561 Pain in right knee: Secondary | ICD-10-CM | POA: Diagnosis not present

## 2023-12-15 DIAGNOSIS — M25571 Pain in right ankle and joints of right foot: Secondary | ICD-10-CM | POA: Diagnosis not present

## 2023-12-17 ENCOUNTER — Other Ambulatory Visit: Payer: Self-pay | Admitting: Internal Medicine

## 2023-12-17 DIAGNOSIS — N952 Postmenopausal atrophic vaginitis: Secondary | ICD-10-CM

## 2023-12-17 DIAGNOSIS — N941 Unspecified dyspareunia: Secondary | ICD-10-CM

## 2023-12-19 NOTE — Telephone Encounter (Signed)
 Requested Prescriptions  Refused Prescriptions Disp Refills   conjugated estrogens  (PREMARIN ) vaginal cream [Pharmacy Med Name: PREMARIN  VAGINAL CREAM 30GM] 30 g 0    Sig: INSERT 1 GRAM VAGINALLY ONCE WEEKLY AS MAINTENANCE     OB/GYN:  Estrogens  Failed - 12/19/2023  5:53 PM      Failed - Mammogram is up-to-date per Health Maintenance      Failed - Last BP in normal range    BP Readings from Last 1 Encounters:  12/05/23 (!) 144/86         Failed - Valid encounter within last 12 months    Recent Outpatient Visits           2 weeks ago Essential (primary) hypertension    Primary Care & Sports Medicine at [facility], Leita DEL, MD       Future Appointments             In 1 month Justus, Leita DEL, MD [facility] at [facility], [facility]

## 2023-12-20 DIAGNOSIS — M25561 Pain in right knee: Secondary | ICD-10-CM | POA: Diagnosis not present

## 2023-12-26 DIAGNOSIS — M25571 Pain in right ankle and joints of right foot: Secondary | ICD-10-CM | POA: Diagnosis not present

## 2023-12-26 DIAGNOSIS — M25572 Pain in left ankle and joints of left foot: Secondary | ICD-10-CM | POA: Diagnosis not present

## 2023-12-26 DIAGNOSIS — M5416 Radiculopathy, lumbar region: Secondary | ICD-10-CM | POA: Diagnosis not present

## 2023-12-28 ENCOUNTER — Other Ambulatory Visit: Payer: Self-pay | Admitting: Internal Medicine

## 2023-12-28 DIAGNOSIS — N952 Postmenopausal atrophic vaginitis: Secondary | ICD-10-CM

## 2023-12-28 DIAGNOSIS — N941 Unspecified dyspareunia: Secondary | ICD-10-CM

## 2023-12-29 DIAGNOSIS — M25571 Pain in right ankle and joints of right foot: Secondary | ICD-10-CM | POA: Diagnosis not present

## 2023-12-29 DIAGNOSIS — M25561 Pain in right knee: Secondary | ICD-10-CM | POA: Diagnosis not present

## 2023-12-29 DIAGNOSIS — R600 Localized edema: Secondary | ICD-10-CM | POA: Diagnosis not present

## 2023-12-29 DIAGNOSIS — G8929 Other chronic pain: Secondary | ICD-10-CM | POA: Diagnosis not present

## 2023-12-30 NOTE — Telephone Encounter (Signed)
 Discontinued None [name], MD 12/05/23 1538         Requested Prescriptions  Refused Prescriptions Disp Refills   conjugated estrogens  (PREMARIN ) vaginal cream [Pharmacy Med Name: PREMARIN  VAGINAL CREAM 30GM] 30 g 0    Sig: INSERT 1 GRAM VAGINALLY ONCE WEEKLY AS MAINTENANCE     There is no refill protocol information for this order

## 2024-01-10 DIAGNOSIS — Z791 Long term (current) use of non-steroidal anti-inflammatories (NSAID): Secondary | ICD-10-CM | POA: Diagnosis not present

## 2024-01-10 LAB — BASIC METABOLIC PANEL WITH GFR
BUN: 17 (ref 4–21)
CO2: 23 — AB (ref 13–22)
Chloride: 97 — AB (ref 99–108)
Creatinine: 1 (ref 0.5–1.1)
Glucose: 87
Potassium: 4.3 meq/L (ref 3.5–5.1)
Sodium: 136 — AB (ref 137–147)

## 2024-01-10 LAB — COMPREHENSIVE METABOLIC PANEL WITH GFR
Albumin: 4.1 (ref 3.5–5.0)
Calcium: 9.4 (ref 8.7–10.7)
eGFR: 67

## 2024-01-13 ENCOUNTER — Other Ambulatory Visit: Payer: Self-pay | Admitting: Internal Medicine

## 2024-01-13 DIAGNOSIS — M25561 Pain in right knee: Secondary | ICD-10-CM | POA: Diagnosis not present

## 2024-01-13 DIAGNOSIS — G43009 Migraine without aura, not intractable, without status migrainosus: Secondary | ICD-10-CM

## 2024-01-13 DIAGNOSIS — R600 Localized edema: Secondary | ICD-10-CM | POA: Diagnosis not present

## 2024-01-13 DIAGNOSIS — G8929 Other chronic pain: Secondary | ICD-10-CM | POA: Diagnosis not present

## 2024-01-13 DIAGNOSIS — M25571 Pain in right ankle and joints of right foot: Secondary | ICD-10-CM | POA: Diagnosis not present

## 2024-01-17 DIAGNOSIS — G8929 Other chronic pain: Secondary | ICD-10-CM | POA: Diagnosis not present

## 2024-01-17 DIAGNOSIS — R600 Localized edema: Secondary | ICD-10-CM | POA: Diagnosis not present

## 2024-01-17 DIAGNOSIS — M25561 Pain in right knee: Secondary | ICD-10-CM | POA: Diagnosis not present

## 2024-01-17 DIAGNOSIS — M25571 Pain in right ankle and joints of right foot: Secondary | ICD-10-CM | POA: Diagnosis not present

## 2024-01-17 NOTE — Telephone Encounter (Signed)
 LOV 12/05/2023  Requested Prescriptions  Pending Prescriptions Disp Refills   SUMAtriptan (IMITREX) 100 MG tablet [Pharmacy Med Name: SUMATRIPTAN 100MG  TABLETS] 9 tablet 5    Sig: TAKE 1 TABLET(100 MG) BY MOUTH DAILY AS NEEDED     Neurology:  Migraine Therapy - Triptan Failed - 01/17/2024  2:15 PM      Failed - Last BP in normal range    BP Readings from Last 1 Encounters:  12/05/23 (!) 144/86         Failed - Valid encounter within last 12 months    Recent Outpatient Visits           1 month ago Essential (primary) hypertension   [place] [facility] & Sports Medicine at [facility], Leita DEL, MD       Future Appointments             In 1 week Justus, Leita DEL, MD [facility] Primary Care & Sports Medicine at [facility], [facility]

## 2024-01-24 ENCOUNTER — Telehealth: Payer: Self-pay | Admitting: Internal Medicine

## 2024-01-24 ENCOUNTER — Encounter: Payer: Self-pay | Admitting: Internal Medicine

## 2024-01-24 ENCOUNTER — Ambulatory Visit (INDEPENDENT_AMBULATORY_CARE_PROVIDER_SITE_OTHER): Admitting: Internal Medicine

## 2024-01-24 VITALS — BP 122/76 | HR 70 | Ht 66.0 in | Wt 204.0 lb

## 2024-01-24 DIAGNOSIS — G43009 Migraine without aura, not intractable, without status migrainosus: Secondary | ICD-10-CM

## 2024-01-24 DIAGNOSIS — E785 Hyperlipidemia, unspecified: Secondary | ICD-10-CM

## 2024-01-24 DIAGNOSIS — I10 Essential (primary) hypertension: Secondary | ICD-10-CM

## 2024-01-24 NOTE — Telephone Encounter (Signed)
 Contacted patient.

## 2024-01-24 NOTE — Telephone Encounter (Signed)
 Copied from CRM [id number]. Topic: General - Other >> Jan 24, 2024 12:26 PM Zebedee SAUNDERS wrote: Reason for CRM: Pt returning call to clinic regarding short term f/u HTD. Please call pt at [phone]

## 2024-01-24 NOTE — Assessment & Plan Note (Addendum)
 No recent change in migraine headaches.  She did not do well with Nurtec due to prolonged time to relief. Headaches respond well to current therapy with Imitrex . Will continue regimen;  follow up if worsening.

## 2024-01-24 NOTE — Telephone Encounter (Signed)
 Copied from CRM [id number]. Topic: General - Other >> Jan 24, 2024 11:58 AM Alicia Nichols wrote: Reason for CRM: Pt needs clarification on reason for appt today at 3:20, I could read on appt notes that it say short term follow up HTN, I could not clarify to pt what HTN stands for, please call her back at [phone] to do so.

## 2024-01-24 NOTE — Progress Notes (Signed)
 Date:  01/24/2024   Name:  Alicia Nichols   DOB:  [birth year]   MRN:  [id number]   Chief Complaint: Hypertension  Hypertension This is a chronic problem. The problem is uncontrolled. Pertinent negatives include no chest pain, headaches, palpitations or shortness of breath. Past treatments include ACE inhibitors and diuretics (dose increased 6 weeks ago).    Review of Systems  Constitutional:  Negative for fatigue and unexpected weight change.  HENT:  Negative for trouble swallowing.   Eyes:  Negative for visual disturbance.  Respiratory:  Negative for cough, chest tightness, shortness of breath and wheezing.   Cardiovascular:  Negative for chest pain, palpitations and leg swelling.  Gastrointestinal:  Negative for abdominal pain, constipation and diarrhea.  Musculoskeletal:  Negative for arthralgias and myalgias.  Neurological:  Negative for dizziness, weakness, light-headedness and headaches.     Lab Results  Component Value Date   NA 136 (A) 01/10/2024   K 4.3 01/10/2024   CO2 23 (A) 01/10/2024   GLUCOSE 90 12/07/2022   BUN 17 01/10/2024   CREATININE 1.0 01/10/2024   CALCIUM 9.4 01/10/2024   EGFR 67 01/10/2024   GFRNONAA 83 11/20/2019   Lab Results  Component Value Date   CHOL 242 (H) 12/07/2022   HDL 51 12/07/2022   LDLCALC 160 (H) 12/07/2022   TRIG 172 (H) 12/07/2022   CHOLHDL 4.7 (H) 12/07/2022   Lab Results  Component Value Date   TSH 1.430 12/07/2022   Lab Results  Component Value Date   HGBA1C 5.2 12/01/2021   Lab Results  Component Value Date   WBC 4.7 12/07/2022   HGB 11.7 12/07/2022   HCT 35.5 12/07/2022   MCV 87 12/07/2022   PLT 392 12/07/2022   Lab Results  Component Value Date   ALT 13 12/07/2022   AST 16 12/07/2022   ALKPHOS 92 12/07/2022   BILITOT 0.2 12/07/2022   No results found for: MARIEN BOLLS, VD25OH   Patient Active Problem List   Diagnosis Date Noted   Cervical disc disease 12/05/2023   Degeneration of  intervertebral disc of lumbar region with discogenic back pain 12/05/2023   Chronic pain syndrome 12/05/2023   Refusal of blood transfusions as patient is Jehovah's Witness 10/05/2022   Pruritic condition 10/05/2022   Vaginal atrophy 10/05/2022   Generalized anxiety disorder 01/22/2022   Dyspareunia in female 06/30/2021   Chronic idiopathic constipation 11/20/2019   Drug-induced constipation 11/10/2017   Polyp of sigmoid colon    Hyperlipidemia, mild 11/10/2016   Rosacea 10/21/2015   Fibrocystic breast changes 06/06/2015   Essential (primary) hypertension 12/03/2014   Urge incontinence 12/03/2014   H/O peptic ulcer 12/03/2014   Fibromyalgia 12/03/2014   Migraine without aura and without status migrainosus, not intractable 12/03/2014   Mild intermittent asthma without complication 12/03/2014   Idiopathic insomnia 12/03/2014    Allergies  Allergen Reactions   Aspirin Shortness Of Breath   Hydrocodone Shortness Of Breath   Ambien [Zolpidem]     Suicidal, also caused vision issues   Bupropion Hcl     intolerance   Divalproex Sodium Er Nausea And Vomiting   Duloxetine Hcl     intolerance   Escitalopram     intolerance   Macrobid  [Nitrofurantoin ]     Severe abdominal pain   Morphine Sulfate Itching   Naproxen     delusions   Other Swelling    Beer and liquor but not sure what the main cause is   Oxycodone  Hcl Nausea  And Vomiting    Can only take the [place] manufactured    Ampicillin Rash   Codeine  Itching and Rash        Epinephrine  Anxiety   Latex Rash    Gloves - when worn for extended period   Oxybutynin  Other (See Comments)    Headache   Sulfa Antibiotics Rash    Past Surgical History:  Procedure Laterality Date   arm surgery Left 2018   reduction osteotomy   BACK SURGERY     BILATERAL CARPAL TUNNEL RELEASE Bilateral 2017   CATARACT EXTRACTION W/PHACO Right 04/16/2019   Procedure: CATARACT EXTRACTION PHACO AND INTRAOCULAR LENS PLACEMENT (IOC) RIGHT  SYMFONY LENS 00:31.0  9.5%  3.03;  Surgeon: Myrna Adine Anes, MD;  Location: [facility];  Service: Ophthalmology;  Laterality: Right;  Latex   CATARACT EXTRACTION W/PHACO Left 05/14/2019   Procedure: CATARACT EXTRACTION PHACO AND INTRAOCULAR LENS PLACEMENT (IOC) LEFT SYMFONY LENS, 0.25, 00:04.9;  Surgeon: Myrna Adine Anes, MD;  Location: [facility];  Service: Ophthalmology;  Laterality: Left;   COLONOSCOPY WITH PROPOFOL  N/A 12/16/2016   Procedure: COLONOSCOPY WITH PROPOFOL ;  Surgeon: Jinny Carmine, MD;  Location: [facility];  Service: Endoscopy;  Laterality: N/A;   HAND SURGERY Left 2008   trauma surgery after dog bite   INSERTION OF MESH N/A 11/25/2022   Procedure: INSERTION OF MESH;  Surgeon: Desiderio Schanz, MD;  Location: [facility];  Service: General;  Laterality: N/A;   KNEE ARTHROSCOPY     LAMINECTOMY  1992   MENISCUS REPAIR Right 01/2014   POLYPECTOMY  12/16/2016   Procedure: POLYPECTOMY INTESTINAL;  Surgeon: Jinny Carmine, MD;  Location: [facility];  Service: Endoscopy;;   TOTAL ABDOMINAL HYSTERECTOMY  2011   total with BSO by patient report   XI ROBOTIC ASSISTED VENTRAL HERNIA N/A 11/25/2022   Procedure: XI ROBOTIC ASSISTED VENTRAL HERNIA, incarcerated;  Surgeon: Desiderio Schanz, MD;  Location: [facility];  Service: General;  Laterality: N/A;    Social History   Tobacco Use   Smoking status: Former    Current packs/day: 0.00    Types: Cigarettes    Start date: 85    Quit date: 1988    Years since quitting: 37.6    Passive exposure: Past   Smokeless tobacco: Never  Vaping Use   Vaping status: Never Used  Substance Use Topics   Alcohol use: Yes    Alcohol/week: 0.0 standard drinks of alcohol    Comment: may have a drink several times per year   Drug use: No     Medication list has been reviewed and updated.  Current Meds  Medication Sig   acetaminophen  (TYLENOL ) 500 MG tablet Take 2 tablets (1,000 mg total) by mouth every 6 (six)  hours as needed for mild pain.   albuterol  (VENTOLIN  HFA) 108 (90 Base) MCG/ACT inhaler Inhale 2 puffs into the lungs every 6 (six) hours as needed for wheezing or shortness of breath.   cyanocobalamin (VITAMIN B12) 1000 MCG tablet Take 1,000 mcg by mouth as needed.   diclofenac (VOLTAREN) 75 MG EC tablet Take 75 mg by mouth 2 (two) times daily as needed.   diclofenac Sodium (VOLTAREN) 1 % GEL Apply topically as needed.   diphenhydrAMINE (BENADRYL) 50 MG capsule Take 50 mg by mouth every 6 (six) hours as needed.   ibuprofen  (ADVIL ) 800 MG tablet Take 1 tablet (800 mg total) by mouth every 8 (eight) hours as needed for moderate pain.   lansoprazole  (PREVACID ) 30  MG capsule TAKE 1 CAPSULE BY MOUTH DAILY AT 12 NOON   lisinopril -hydrochlorothiazide  (ZESTORETIC ) 20-12.5 MG tablet Take 2 tablets by mouth daily. (Patient taking differently: Take 1.5 tablets by mouth daily.)   metaxalone (SKELAXIN) 800 MG tablet Take 800 mg by mouth daily. 1/2 at bedtime and as needed   Probiotic Product (PROBIOTIC DAILY PO) Take by mouth daily.   Pseudoephedrine -APAP-DM (DAYQUIL PO) Take by mouth as needed.   SUMAtriptan  (IMITREX ) 100 MG tablet TAKE 1 TABLET(100 MG) BY MOUTH DAILY AS NEEDED       01/24/2024    3:28 PM 12/05/2023    4:16 PM 12/13/2022    1:42 PM 12/07/2022   10:14 AM  GAD 7 : Generalized Anxiety Score  Nervous, Anxious, on Edge 1 1 0 2  Control/stop worrying 0 0 0 0  Worry too much - different things 0 0 0 1  Trouble relaxing 1 1 0 3  Restless 0 0 3 2  Easily annoyed or irritable 1 1 0 0  Afraid - awful might happen 0 0 0 0  Total GAD 7 Score 3 3 3 8   Anxiety Difficulty Not difficult at all Not difficult at all Not difficult at all Not difficult at all       01/24/2024    3:27 PM 12/05/2023    4:13 PM 12/13/2022    1:41 PM  Depression screen PHQ 2/9  Decreased Interest 0 0 2  Down, Depressed, Hopeless 0 1 2  PHQ - 2 Score 0 1 4  Altered sleeping 0 1 2  Tired, decreased energy 0 2 2  Change  in appetite 0 1 2  Feeling bad or failure about yourself  0 0 0  Trouble concentrating 0 0 3  Moving slowly or fidgety/restless 0 0 1  Suicidal thoughts 0 0 0  PHQ-9 Score 0 5 14  Difficult doing work/chores Not difficult at all Somewhat difficult Somewhat difficult    BP Readings from Last 3 Encounters:  01/24/24 122/76  12/05/23 (!) 144/86  02/02/23 114/80    Physical Exam Vitals and nursing note reviewed.  Constitutional:      General: She is not in acute distress.    Appearance: Normal appearance. She is well-developed.  HENT:     Head: Normocephalic and atraumatic.  Cardiovascular:     Rate and Rhythm: Normal rate and regular rhythm.     Heart sounds: No murmur heard. Pulmonary:     Effort: Pulmonary effort is normal. No respiratory distress.     Breath sounds: No wheezing or rhonchi.  Musculoskeletal:     Cervical back: Normal range of motion.     Right lower leg: No edema.     Left lower leg: No edema.  Lymphadenopathy:     Cervical: No cervical adenopathy.  Skin:    General: Skin is warm and dry.     Findings: No rash.  Neurological:     Mental Status: She is alert and oriented to person, place, and time.  Psychiatric:        Mood and Affect: Mood normal.        Behavior: Behavior normal.     Wt Readings from Last 3 Encounters:  01/24/24 204 lb (92.5 kg)  12/05/23 200 lb (90.7 kg)  02/02/23 209 lb (94.8 kg)    BP 122/76   Pulse 70   Ht 5' 6 (1.676 m)   Wt 204 lb (92.5 kg)   SpO2 98%   BMI 32.93 kg/m  Assessment and Plan:  Problem List Items Addressed This Visit       Unprioritized   Essential (primary) hypertension - Primary (Chronic)   BP elevated last visit - medication dose increased to lisinopril  40 mg with hydrochlorothiazide  25 mg. Adjusted the dose due to side effects so now on lisinopril  20/12/5 - 1.5 mg daily.       Migraine without aura and without status migrainosus, not intractable (Chronic)   No recent change in migraine  headaches.  She did not do well with Nurtec due to prolonged time to relief. Headaches respond well to current therapy with Imitrex . Will continue regimen;  follow up if worsening.       Hyperlipidemia, mild (Chronic)   Not currently on any medications. Will check with next CPX Lab Results  Component Value Date   LDLCALC 160 (H) 12/07/2022          Return in about 4 months (around 05/25/2024) for CPX.    Leita HILARIO Adie, MD [facility] [place]

## 2024-01-24 NOTE — Assessment & Plan Note (Addendum)
 Not currently on any medications. Will check with next CPX Lab Results  Component Value Date   LDLCALC 160 (H) 12/07/2022

## 2024-01-24 NOTE — Assessment & Plan Note (Addendum)
 BP elevated last visit - medication dose increased to lisinopril  40 mg with hydrochlorothiazide  25 mg. Adjusted the dose due to side effects so now on lisinopril  20/12/5 - 1.5 mg daily.

## 2024-01-25 ENCOUNTER — Ambulatory Visit

## 2024-01-25 VITALS — Ht 66.0 in | Wt 205.0 lb

## 2024-01-25 DIAGNOSIS — Z0000 Encounter for general adult medical examination without abnormal findings: Secondary | ICD-10-CM | POA: Diagnosis not present

## 2024-01-25 DIAGNOSIS — H9190 Unspecified hearing loss, unspecified ear: Secondary | ICD-10-CM | POA: Diagnosis not present

## 2024-01-25 DIAGNOSIS — Z1231 Encounter for screening mammogram for malignant neoplasm of breast: Secondary | ICD-10-CM | POA: Diagnosis not present

## 2024-01-25 NOTE — Progress Notes (Signed)
 Subjective:   Alicia Nichols is a 63 y.o. who presents for a Medicare Wellness preventive visit.  As a reminder, Annual Wellness Visits don't include a physical exam, and some assessments may be limited, especially if this visit is performed virtually. We may recommend an in-person follow-up visit with your provider if needed.  Visit Complete: Virtual I connected with  Alicia Nichols on 01/25/24 by a audio enabled telemedicine application and verified that I am speaking with the correct person using two identifiers.  Patient Location: Home  Provider Location: Home Office  I discussed the limitations of evaluation and management by telemedicine. The patient expressed understanding and agreed to proceed.  Vital Signs: Because this visit was a virtual/telehealth visit, some criteria may be missing or patient reported. Any vitals not documented were not able to be obtained and vitals that have been documented are patient reported.  VideoDeclined- This patient declined Librarian, academic. Therefore the visit was completed with audio only.  Persons Participating in Visit: Patient.  AWV Questionnaire: No: Patient Medicare AWV questionnaire was not completed prior to this visit.  Cardiac Risk Factors include: dyslipidemia;hypertension     Objective:    Today's Vitals   01/25/24 1448 01/25/24 1449  Weight: 205 lb (93 kg)   Height: 5' 6 (1.676 m)   PainSc:  5    Body mass index is 33.09 kg/m.     01/25/2024    3:10 PM 11/25/2022    6:28 AM 11/18/2022    3:56 PM 01/25/2020    6:44 PM 05/14/2019   12:24 PM 04/16/2019    8:00 AM 12/16/2016   10:50 AM  Advanced Directives  Does Patient Have a Medical Advance Directive? Yes Yes Yes No Yes Yes Yes   Type of Estate agent of [place];Living will Healthcare Power of [place];Living will   Healthcare Power of [place];Living will Healthcare Power of [place];Living will Healthcare Power of  [place];Living will  Does patient want to make changes to medical advance directive? No - Patient declined    No - Patient declined No - Patient declined No - Patient declined   Copy of Healthcare Power of Attorney in Chart? Yes - validated most recent copy scanned in chart (See row information)    Yes - validated most recent copy scanned in chart (See row information) Yes - validated most recent copy scanned in chart (See row information) Yes      Data saved with a previous flowsheet row definition    Current Medications (verified) Outpatient Encounter Medications as of 01/25/2024  Medication Sig   acetaminophen  (TYLENOL ) 500 MG tablet Take 2 tablets (1,000 mg total) by mouth every 6 (six) hours as needed for mild pain.   albuterol  (VENTOLIN  HFA) 108 (90 Base) MCG/ACT inhaler Inhale 2 puffs into the lungs every 6 (six) hours as needed for wheezing or shortness of breath.   cyanocobalamin (VITAMIN B12) 1000 MCG tablet Take 1,000 mcg by mouth as needed.   diclofenac (VOLTAREN) 75 MG EC tablet Take 75 mg by mouth 2 (two) times daily as needed.   diclofenac Sodium (VOLTAREN) 1 % GEL Apply topically as needed.   diphenhydrAMINE (BENADRYL) 50 MG capsule Take 50 mg by mouth every 6 (six) hours as needed.   ibuprofen  (ADVIL ) 800 MG tablet Take 1 tablet (800 mg total) by mouth every 8 (eight) hours as needed for moderate pain. (Patient taking differently: Take 800 mg by mouth every 8 (eight) hours as needed for  moderate pain (pain score 4-6). Takes up to 600mg  prn)   lansoprazole  (PREVACID ) 30 MG capsule TAKE 1 CAPSULE BY MOUTH DAILY AT 12 NOON   lisinopril -hydrochlorothiazide  (ZESTORETIC ) 20-12.5 MG tablet Take 2 tablets by mouth daily. (Patient taking differently: Take 2 tablets by mouth daily. Takes 1.5 tablets daily)   metaxalone (SKELAXIN) 800 MG tablet Take 800 mg by mouth daily. 1/2 at bedtime and as needed   Multiple Vitamins-Minerals (PRESERVISION AREDS PO) Take 1 tablet by mouth 2 (two) times  daily.   Probiotic Product (PROBIOTIC DAILY PO) Take by mouth daily.   Pseudoephedrine -APAP-DM (DAYQUIL PO) Take by mouth as needed.   SUMAtriptan  (IMITREX ) 100 MG tablet TAKE 1 TABLET(100 MG) BY MOUTH DAILY AS NEEDED   No facility-administered encounter medications on file as of 01/25/2024.    Allergies (verified) Aspirin, Hydrocodone, Ambien [zolpidem], Bupropion hcl, Divalproex sodium er, Duloxetine hcl, Escitalopram, Macrobid  [nitrofurantoin ], Morphine sulfate, Naproxen, Other, Oxycodone  hcl, Ampicillin, Codeine , Epinephrine , Latex, Oxybutynin , and Sulfa antibiotics   History: Past Medical History:  Diagnosis Date   ACL (anterior cruciate ligament) tear 10/21/2015   Anemia    Anxiety    Asthma    BRCA negative 07/2021   MyRisk neg   Complication of anesthesia    woke up during cataract surgery   Depression    Family history of adverse reaction to anesthesia    sister awareness under anesthesia   Family history of breast cancer    Fibrocystic breast changes    Fibromyalgia    GERD (gastroesophageal reflux disease)    Headache    History of cervical dysplasia    Hypertension    Idiopathic insomnia    Increased risk of breast cancer 07/2021   IBIS=14.5%/riskscore=23.7%   Motion sickness    cars, roller coasters   Osteoarthritis    Pneumonia    Polyp of sigmoid colon    Refusal of blood transfusions as patient is Jehovah's Witness    Rosacea    Urge incontinence    Past Surgical History:  Procedure Laterality Date   arm surgery Left 2018   reduction osteotomy   BACK SURGERY     BILATERAL CARPAL TUNNEL RELEASE Bilateral 2017   CATARACT EXTRACTION W/PHACO Right 04/16/2019   Procedure: CATARACT EXTRACTION PHACO AND INTRAOCULAR LENS PLACEMENT (IOC) RIGHT SYMFONY LENS 00:31.0  9.5%  3.03;  Surgeon: Myrna Adine Anes, MD;  Location: [facility];  Service: Ophthalmology;  Laterality: Right;  Latex   CATARACT EXTRACTION W/PHACO Left 05/14/2019   Procedure:  CATARACT EXTRACTION PHACO AND INTRAOCULAR LENS PLACEMENT (IOC) LEFT SYMFONY LENS, 0.25, 00:04.9;  Surgeon: Myrna Adine Anes, MD;  Location: [facility];  Service: Ophthalmology;  Laterality: Left;   COLONOSCOPY WITH PROPOFOL  N/A 12/16/2016   Procedure: COLONOSCOPY WITH PROPOFOL ;  Surgeon: Jinny Carmine, MD;  Location: [facility];  Service: Endoscopy;  Laterality: N/A;   HAND SURGERY Left 2008   trauma surgery after dog bite   INSERTION OF MESH N/A 11/25/2022   Procedure: INSERTION OF MESH;  Surgeon: Desiderio Schanz, MD;  Location: [facility];  Service: General;  Laterality: N/A;   KNEE ARTHROSCOPY     LAMINECTOMY  1992   MENISCUS REPAIR Right 01/2014   POLYPECTOMY  12/16/2016   Procedure: POLYPECTOMY INTESTINAL;  Surgeon: Jinny Carmine, MD;  Location: [facility];  Service: Endoscopy;;   TOTAL ABDOMINAL HYSTERECTOMY  2011   total with BSO by patient report   XI ROBOTIC ASSISTED VENTRAL HERNIA N/A 11/25/2022   Procedure: XI ROBOTIC  ASSISTED VENTRAL HERNIA, incarcerated;  Surgeon: Desiderio Schanz, MD;  Location: [facility] OR[facility];  Service: General;  Laterality: N/A;   Family History  Problem Relation Age of Onset   Rheum arthritis Mother    Breast cancer Mother    Breast cancer Maternal Grandmother        20s   Breast cancer Maternal Aunt        38s   Breast cancer Maternal Aunt        50s/60s   Breast cancer Cousin        74s   Breast cancer Cousin        79s   Social History   Socioeconomic History   Marital status: Married    Spouse name: Rob   Number of children: 3   Years of education: Not on file   Highest education level: Not on file  Occupational History   Not on file  Tobacco Use   Smoking status: Former    Current packs/day: 0.00    Types: Cigarettes    Start date: 21    Quit date: 1988    Years since quitting: 37.6    Passive exposure: Past   Smokeless tobacco: Never  Vaping Use   Vaping status: Never Used  Substance and Sexual Activity    Alcohol use: Yes    Comment: may have a drink several times per year, monthly or less   Drug use: No   Sexual activity: Yes    Birth control/protection: Surgical    Comment: Hysterectomy  Other Topics Concern   Not on file  Social History Narrative   Not on file   Social Drivers of Health   Financial Resource Strain: Low Risk  (01/25/2024)   Overall Financial Resource Strain (CARDIA)    Difficulty of Paying Living Expenses: Not hard at all  Food Insecurity: No Food Insecurity (01/25/2024)   Hunger Vital Sign    Worried About Running Out of Food in the Last Year: Never true    Ran Out of Food in the Last Year: Never true  Transportation Needs: No Transportation Needs (01/25/2024)   PRAPARE - Administrator, Civil Service (Medical): No    Lack of Transportation (Non-Medical): No  Physical Activity: Insufficiently Active (01/25/2024)   Exercise Vital Sign    Days of Exercise per Week: 1 day    Minutes of Exercise per Session: 60 min  Stress: Stress Concern Present (01/25/2024)   [employer] of Occupational Health - Occupational Stress Questionnaire    Feeling of Stress: Very much  Social Connections: Moderately Integrated (01/25/2024)   Social Connection and Isolation Panel    Frequency of Communication with Friends and Family: More than three times a week    Frequency of Social Gatherings with Friends and Family: More than three times a week    Attends Religious Services: More than 4 times per year    Active Member of [employer] or Organizations: No    Attends Engineer, structural: Never    Marital Status: Married    Tobacco Counseling Counseling given: Not Answered    Clinical Intake:  Pre-visit preparation completed: Yes  Pain : 0-10 Pain Score: 5  Pain Type: Chronic pain Pain Location: Back Pain Descriptors / Indicators: Aching     BMI - recorded: 33.09 Nutritional Status: BMI > 30  Obese Nutritional Risks: None Diabetes: No  Lab Results   Component Value Date   HGBA1C 5.2 12/01/2021     How often  do you need to have someone help you when you read instructions, pamphlets, or other written materials from your doctor or pharmacy?: 2 - Rarely (for tiny print)  Interpreter Needed?: No  Information entered by :: Vina Ned, CMA   Activities of Daily Living     01/25/2024    2:54 PM  In your present state of health, do you have any difficulty performing the following activities:  Hearing? 1  Comment referral to ENT for hearing evaluation  Vision? 0  Difficulty concentrating or making decisions? 1  Walking or climbing stairs? 1  Comment has to limit stairs  Dressing or bathing? 0  Doing errands, shopping? 0  Preparing Food and eating ? N  Using the Toilet? N  In the past six months, have you accidently leaked urine? Y  Comment wears pads  Do you have problems with loss of bowel control? N  Managing your Medications? N  Managing your Finances? N  Housekeeping or managing your Housekeeping? N    Patient Care Team: [name], MD as PCP - General (Internal Medicine) Mevelyn Charleston, OD as Referring Physician (Optometry) Arcedo, Perico, DO (Rehabilitation) Emmitt Garre, DC as Referring Physician (Chiropractic Medicine) Cathlyn Seal, MD as Referring Physician (Dermatology)  I have updated your Care Teams any recent Medical Services you may have received from other providers in the past year.     Assessment:   This is a routine wellness examination for Shadi.  Hearing/Vision screen Hearing Screening - Comments:: Denies hearing loss  Vision Screening - Comments:: Gets routine eye exams, Dr. Mevelyn Molly    Goals Addressed             This Visit's Progress    Patient Stated       Lessen pain       Depression Screen     01/25/2024    3:08 PM 01/24/2024    3:27 PM 12/05/2023    4:13 PM 12/13/2022    1:41 PM 12/07/2022   10:14 AM 10/05/2022    1:21 PM 01/22/2022   11:11 AM  PHQ  2/9 Scores  PHQ - 2 Score 0 0 1 4 3 3 2   PHQ- 9 Score 0 0 5 14 15 15 6     Fall Risk     01/25/2024    3:11 PM 01/24/2024    3:27 PM 12/05/2023    4:13 PM 12/13/2022    1:40 PM 12/07/2022   10:14 AM  Fall Risk   Falls in the past year? 1 1 1 1  0  Number falls in past yr: 0 0 0 0 0  Injury with Fall? 0 0 0 1 0  Risk for fall due to : History of fall(s);Impaired balance/gait;Impaired mobility;Orthopedic patient History of fall(s)  History of fall(s) No Fall Risks  Follow up Falls evaluation completed;Education provided Falls evaluation completed  Falls evaluation completed Falls evaluation completed    MEDICARE RISK AT HOME:  Medicare Risk at Home Any stairs in or around the home?: Yes If so, are there any without handrails?: No Home free of loose throw rugs in walkways, pet beds, electrical cords, etc?: Yes Adequate lighting in your home to reduce risk of falls?: Yes Life alert?: No Use of a cane, walker or w/c?: No Grab bars in the bathroom?: Yes Shower chair or bench in shower?: No Elevated toilet seat or a handicapped toilet?: Yes  TIMED UP AND GO:  Was the test performed?  No  Cognitive Function: 6CIT completed  01/25/2024    3:14 PM  6CIT Screen  What Year? 0 points  What month? 0 points  What time? 0 points  Count back from 20 0 points  Months in reverse 0 points  Repeat phrase 0 points  Total Score 0 points    Immunizations Immunization History  Administered Date(s) Administered   Influenza,inj,Quad PF,6+ Mos 05/09/2019, 06/12/2021   PFIZER(Purple Top)SARS-COV-2 Vaccination 11/13/2019, 12/06/2019   Tdap 04/24/2013    Screening Tests Health Maintenance  Topic Date Due   Pneumococcal Vaccine: 50+ Years (1 of 2 - PCV) Never done   COVID-19 Vaccine (3 - Pfizer risk series) 01/03/2020   MAMMOGRAM  12/08/2022   DTaP/Tdap/Td (2 - Td or Tdap) 04/25/2023   Zoster Vaccines- Shingrix (1 of 2) 04/25/2024 (Originally 05/18/1980)   INFLUENZA VACCINE   09/04/2024 (Originally 01/06/2024)   Medicare Annual Wellness (AWV)  01/24/2025   Colonoscopy  12/17/2026   Hepatitis C Screening  Completed   HIV Screening  Completed   Hepatitis B Vaccines 19-59 Average Risk  Aged Out   HPV VACCINES  Aged Out   Meningococcal B Vaccine  Aged Out    Health Maintenance  Health Maintenance Due  Topic Date Due   Pneumococcal Vaccine: 50+ Years (1 of 2 - PCV) Never done   COVID-19 Vaccine (3 - Pfizer risk series) 01/03/2020   MAMMOGRAM  12/08/2022   DTaP/Tdap/Td (2 - Td or Tdap) 04/25/2023   Health Maintenance Items Addressed: Mammogram ordered, See Nurse Notes at the end of this note  Additional Screening:  Vision Screening: Recommended annual ophthalmology exams for early detection of glaucoma and other disorders of the eye. Would you like a referral to an eye doctor? No    Dental Screening: Recommended annual dental exams for proper oral hygiene  Community Resource Referral / Chronic Care Management: CRR required this visit?  No   CCM required this visit?  No   Plan:    I have personally reviewed and noted the following in the patient's chart:   Medical and social history Use of alcohol, tobacco or illicit drugs  Current medications and supplements including opioid prescriptions. Patient is not currently taking opioid prescriptions. Functional ability and status Nutritional status Physical activity Advanced directives List of other physicians Hospitalizations, surgeries, and ER visits in previous 12 months Vitals Screenings to include cognitive, depression, and falls Referrals and appointments  In addition, I have reviewed and discussed with patient certain preventive protocols, quality metrics, and best practice recommendations. A written personalized care plan for preventive services as well as general preventive health recommendations were provided to patient.   Vina Ned, CMA   01/25/2024   After Visit Summary: (MyChart)  Due to this being a telephonic visit, the after visit summary with patients personalized plan was offered to patient via MyChart   Notes:  Placed referral to ENT for patient reported hearing loss Placed order for [facility] [place]) Needs Tdap (pharmacy) Declined flu, covid pneumonia and shingles vaccines)

## 2024-01-25 NOTE — Patient Instructions (Signed)
 Alicia Nichols , Thank you for taking time out of your busy schedule to complete your Annual Wellness Visit with me. I enjoyed our conversation and look forward to speaking with you again next year. I, as well as your care team,  appreciate your ongoing commitment to your health goals. Please review the following plan we discussed and let me know if I can assist you in the future. Your Game plan/ To Do List    Referrals: If you haven't heard from the office you've been referred to, please reach out to them at the phone provided.  I have placed a referral to [facility] to evaluate your hearing loss (ph# [phone])  Follow up Visits: We will see or speak with you next year for your Next Medicare AWV with our clinical staff Have you seen your provider in the last 6 months (3 months if uncontrolled diabetes)? Yes  Clinician Recommendations: Get a tetanus shot at your local pharmacy at your convenience.  Aim for 30 minutes of exercise or brisk walking, 6-8 glasses of water, and 5 servings of fruits and vegetables each day. I have placed an order for a mammogram. Please call [phone] to schedule at your earliest convenience.      This is a list of the screenings recommended for you:  Health Maintenance  Topic Date Due   Pneumococcal Vaccine for age over 53 (1 of 2 - PCV) Never done   COVID-19 Vaccine (3 - Pfizer risk series) 01/03/2020   Mammogram  12/08/2022   DTaP/Tdap/Td vaccine (2 - Td or Tdap) 04/25/2023   Zoster (Shingles) Vaccine (1 of 2) 04/25/2024*   Flu Shot  09/04/2024*   Medicare Annual Wellness Visit  01/24/2025   Colon Cancer Screening  12/17/2026   Hepatitis C Screening  Completed   HIV Screening  Completed   Hepatitis B Vaccine  Aged Out   HPV Vaccine  Aged Out   Meningitis B Vaccine  Aged Out  *Topic was postponed. The date shown is not the original due date.    Advanced directives: (In Chart) A copy of your advanced directives are scanned into your chart should your  provider ever need it. Advance Care Planning is important because it:  [x]  Makes sure you receive the medical care that is consistent with your values, goals, and preferences  [x]  It provides guidance to your family and loved ones and reduces their decisional burden about whether or not they are making the right decisions based on your wishes.  Follow the link provided in your after visit summary or read over the paperwork we have mailed to you to help you started getting your Advance Directives in place. If you need assistance in completing these, please reach out to us  so that we can help you!  See attachments for Preventive Care and Fall Prevention Tips.   Fall Prevention in the Home, Adult Falls can cause injuries and affect people of all ages. There are many simple things that you can do to make your home safe and to help prevent falls. If you need it, ask for help making these changes. What actions can I take to prevent falls? General information Use good lighting in all rooms. Make sure to: Replace any light bulbs that burn out. Turn on lights if it is dark and use night-lights. Keep items that you use often in easy-to-reach places. Lower the shelves around your home if needed. Move furniture so that there are clear paths around it. Do not keep  throw rugs or other things on the floor that can make you trip. If any of your floors are uneven, fix them. Add color or contrast paint or tape to clearly mark and help you see: Grab bars or handrails. First and last steps of staircases. Where the edge of each step is. If you use a ladder or stepladder: Make sure that it is fully opened. Do not climb a closed ladder. Make sure the sides of the ladder are locked in place. Have someone hold the ladder while you use it. Know where your pets are as you move through your home. What can I do in the bathroom?     Keep the floor dry. Clean up any water that is on the floor right  away. Remove soap buildup in the bathtub or shower. Buildup makes bathtubs and showers slippery. Use non-skid mats or decals on the floor of the bathtub or shower. Attach bath mats securely with double-sided, non-slip rug tape. If you need to sit down while you are in the shower, use a non-slip stool. Install grab bars by the toilet and in the bathtub and shower. Do not use towel bars as grab bars. What can I do in the bedroom? Make sure that you have a light by your bed that is easy to reach. Do not use any sheets or blankets on your bed that hang to the floor. Have a firm bench or chair with side arms that you can use for support when you get dressed. What can I do in the kitchen? Clean up any spills right away. If you need to reach something above you, use a sturdy step stool that has a grab bar. Keep electrical cables out of the way. Do not use floor polish or wax that makes floors slippery. What can I do with my stairs? Do not leave anything on the stairs. Make sure that you have a light switch at the top and the bottom of the stairs. Have them installed if you do not have them. Make sure that there are handrails on both sides of the stairs. Fix handrails that are broken or loose. Make sure that handrails are as long as the staircases. Install non-slip stair treads on all stairs in your home if they do not have carpet. Avoid having throw rugs at the top or bottom of stairs, or secure the rugs with carpet tape to prevent them from moving. Choose a carpet design that does not hide the edge of steps on the stairs. Make sure that carpet is firmly attached to the stairs. Fix any carpet that is loose or worn. What can I do on the outside of my home? Use bright outdoor lighting. Repair the edges of walkways and driveways and fix any cracks. Clear paths of anything that can make you trip, such as tools or rocks. Add color or contrast paint or tape to clearly mark and help you see high doorway  thresholds. Trim any bushes or trees on the main path into your home. Check that handrails are securely fastened and in good repair. Both sides of all steps should have handrails. Install guardrails along the edges of any raised decks or porches. Have leaves, snow, and ice cleared regularly. Use sand, salt, or ice melt on walkways during winter months if you live where there is ice and snow. In the garage, clean up any spills right away, including grease or oil spills. What other actions can I take? Review your medicines with your health  care provider. Some medicines can make you confused or feel dizzy. This can increase your chance of falling. Wear closed-toe shoes that fit well and support your feet. Wear shoes that have rubber soles and low heels. Use a cane, walker, scooter, or crutches that help you move around if needed. Talk with your provider about other ways that you can decrease your risk of falls. This may include seeing a physical therapist to learn to do exercises to improve movement and strength. Where to find more information Centers for Disease Control and Prevention, [employer]: TonerPromos.no [employer] on Aging: BaseRingTones.pl National Institute on Aging: BaseRingTones.pl Contact a health care provider if: You are afraid of falling at home. You feel weak, drowsy, or dizzy at home. You fall at home. Get help right away if you: Lose consciousness or have trouble moving after a fall. Have a fall that causes a head injury. These symptoms may be an emergency. Get help right away. Call 911. Do not wait to see if the symptoms will go away. Do not drive yourself to the hospital. This information is not intended to replace advice given to you by your health care provider. Make sure you discuss any questions you have with your health care provider. Document Revised: 01/25/2022 Document Reviewed: 01/25/2022 Elsevier Patient Education  2024 [employer].

## 2024-01-26 DIAGNOSIS — G8929 Other chronic pain: Secondary | ICD-10-CM | POA: Diagnosis not present

## 2024-01-26 DIAGNOSIS — M25561 Pain in right knee: Secondary | ICD-10-CM | POA: Diagnosis not present

## 2024-01-26 DIAGNOSIS — R600 Localized edema: Secondary | ICD-10-CM | POA: Diagnosis not present

## 2024-01-26 DIAGNOSIS — M25571 Pain in right ankle and joints of right foot: Secondary | ICD-10-CM | POA: Diagnosis not present

## 2024-01-26 DIAGNOSIS — M5416 Radiculopathy, lumbar region: Secondary | ICD-10-CM | POA: Diagnosis not present

## 2024-02-16 ENCOUNTER — Other Ambulatory Visit: Payer: Self-pay | Admitting: Internal Medicine

## 2024-02-16 ENCOUNTER — Telehealth: Payer: Self-pay

## 2024-02-16 DIAGNOSIS — N952 Postmenopausal atrophic vaginitis: Secondary | ICD-10-CM

## 2024-02-16 DIAGNOSIS — N941 Unspecified dyspareunia: Secondary | ICD-10-CM

## 2024-02-16 MED ORDER — PREMARIN 0.625 MG/GM VA CREA: TOPICAL_CREAM | VAGINAL | 0 refills | Status: AC

## 2024-02-16 NOTE — Progress Notes (Unsigned)
 Date:  02/16/2024   Name:  Alicia Nichols   DOB:  1961/03/15   MRN:  [id number]   Chief Complaint: No chief complaint on file.  HPI  Review of Systems   Lab Results  Component Value Date   NA 136 (A) 01/10/2024   K 4.3 01/10/2024   CO2 23 (A) 01/10/2024   GLUCOSE 90 12/07/2022   BUN 17 01/10/2024   CREATININE 1.0 01/10/2024   CALCIUM 9.4 01/10/2024   EGFR 67 01/10/2024   GFRNONAA 83 11/20/2019   Lab Results  Component Value Date   CHOL 242 (H) 12/07/2022   HDL 51 12/07/2022   LDLCALC 160 (H) 12/07/2022   TRIG 172 (H) 12/07/2022   CHOLHDL 4.7 (H) 12/07/2022   Lab Results  Component Value Date   TSH 1.430 12/07/2022   Lab Results  Component Value Date   HGBA1C 5.2 12/01/2021   Lab Results  Component Value Date   WBC 4.7 12/07/2022   HGB 11.7 12/07/2022   HCT 35.5 12/07/2022   MCV 87 12/07/2022   PLT 392 12/07/2022   Lab Results  Component Value Date   ALT 13 12/07/2022   AST 16 12/07/2022   ALKPHOS 92 12/07/2022   BILITOT 0.2 12/07/2022   No results found for: MARIEN BOLLS, VD25OH   Patient Active Problem List   Diagnosis Date Noted   Cervical disc disease 12/05/2023   Degeneration of intervertebral disc of lumbar region with discogenic back pain 12/05/2023   Chronic pain syndrome 12/05/2023   Refusal of blood transfusions as patient is Jehovah's Witness 10/05/2022   Pruritic condition 10/05/2022   Vaginal atrophy 10/05/2022   Generalized anxiety disorder 01/22/2022   Dyspareunia in female 06/30/2021   Chronic idiopathic constipation 11/20/2019   Drug-induced constipation 11/10/2017   Polyp of sigmoid colon    Hyperlipidemia, mild 11/10/2016   Rosacea 10/21/2015   Fibrocystic breast changes 06/06/2015   Essential (primary) hypertension 12/03/2014   Urge incontinence 12/03/2014   H/O peptic ulcer 12/03/2014   Fibromyalgia 12/03/2014   Migraine without aura and without status migrainosus, not intractable 12/03/2014   Mild  intermittent asthma without complication 12/03/2014   Idiopathic insomnia 12/03/2014    Allergies  Allergen Reactions   Aspirin Shortness Of Breath   Hydrocodone Shortness Of Breath   Ambien [Zolpidem]     Suicidal, also caused vision issues   Bupropion Hcl     intolerance   Divalproex Sodium Er Nausea And Vomiting   Duloxetine Hcl     intolerance   Escitalopram     intolerance   Macrobid  [Nitrofurantoin ]     Severe abdominal pain   Morphine Sulfate Itching   Naproxen     delusions   Other Swelling    Beer and liquor but not sure what the main cause is   Oxycodone  Hcl Nausea And Vomiting    Can only take the Mallincrodt manufactured    Ampicillin Rash   Codeine  Itching and Rash        Epinephrine  Anxiety   Latex Rash    Gloves - when worn for extended period   Oxybutynin  Other (See Comments)    Headache   Sulfa Antibiotics Rash    Past Surgical History:  Procedure Laterality Date   arm surgery Left 2018   reduction osteotomy   BACK SURGERY     BILATERAL CARPAL TUNNEL RELEASE Bilateral 2017   CATARACT EXTRACTION W/PHACO Right 04/16/2019   Procedure: CATARACT EXTRACTION PHACO AND INTRAOCULAR LENS PLACEMENT (  IOC) RIGHT SYMFONY LENS 00:31.0  9.5%  3.03;  Surgeon: Myrna Adine Anes, MD;  Location: [facility];  Service: Ophthalmology;  Laterality: Right;  Latex   CATARACT EXTRACTION W/PHACO Left 05/14/2019   Procedure: CATARACT EXTRACTION PHACO AND INTRAOCULAR LENS PLACEMENT (IOC) LEFT SYMFONY LENS, 0.25, 00:04.9;  Surgeon: Myrna Adine Anes, MD;  Location: [facility];  Service: Ophthalmology;  Laterality: Left;   COLONOSCOPY WITH PROPOFOL  N/A 12/16/2016   Procedure: COLONOSCOPY WITH PROPOFOL ;  Surgeon: Jinny Carmine, MD;  Location: [facility];  Service: Endoscopy;  Laterality: N/A;   HAND SURGERY Left 2008   trauma surgery after dog bite   INSERTION OF MESH N/A 11/25/2022   Procedure: INSERTION OF MESH;  Surgeon: Desiderio Schanz, MD;  Location:  [facility];  Service: General;  Laterality: N/A;   KNEE ARTHROSCOPY     LAMINECTOMY  1992   MENISCUS REPAIR Right 01/2014   POLYPECTOMY  12/16/2016   Procedure: POLYPECTOMY INTESTINAL;  Surgeon: Jinny Carmine, MD;  Location: [facility];  Service: Endoscopy;;   TOTAL ABDOMINAL HYSTERECTOMY  2011   total with BSO by patient report   XI ROBOTIC ASSISTED VENTRAL HERNIA N/A 11/25/2022   Procedure: XI ROBOTIC ASSISTED VENTRAL HERNIA, incarcerated;  Surgeon: Desiderio Schanz, MD;  Location: [facility];  Service: General;  Laterality: N/A;    Social History   Tobacco Use   Smoking status: Former    Current packs/day: 0.00    Types: Cigarettes    Start date: 1978    Quit date: 1988    Years since quitting: 37.7    Passive exposure: Past   Smokeless tobacco: Never  Vaping Use   Vaping status: Never Used  Substance Use Topics   Alcohol use: Yes    Comment: may have a drink several times per year, monthly or less   Drug use: No     Medication list has been reviewed and updated.  No outpatient medications have been marked as taking for the 02/16/24 encounter (Orders Only) with [name], MD.       01/24/2024    3:28 PM 12/05/2023    4:16 PM 12/13/2022    1:42 PM 12/07/2022   10:14 AM  GAD 7 : Generalized Anxiety Score  Nervous, Anxious, on Edge 1 1 0 2  Control/stop worrying 0 0 0 0  Worry too much - different things 0 0 0 1  Trouble relaxing 1 1 0 3  Restless 0 0 3 2  Easily annoyed or irritable 1 1 0 0  Afraid - awful might happen 0 0 0 0  Total GAD 7 Score 3 3 3 8   Anxiety Difficulty Not difficult at all Not difficult at all Not difficult at all Not difficult at all       01/25/2024    3:08 PM 01/24/2024    3:27 PM 12/05/2023    4:13 PM  Depression screen PHQ 2/9  Decreased Interest 0 0 0  Down, Depressed, Hopeless 0 0 1  PHQ - 2 Score 0 0 1  Altered sleeping 0 0 1  Tired, decreased energy 0 0 2  Change in appetite 0 0 1  Feeling bad or failure about yourself   0 0 0  Trouble concentrating 0 0 0  Moving slowly or fidgety/restless 0 0 0  Suicidal thoughts 0 0 0  PHQ-9 Score 0 0 5  Difficult doing work/chores Not difficult at all Not difficult at all Somewhat difficult    BP  Readings from Last 3 Encounters:  01/24/24 122/76  12/05/23 (!) 144/86  02/02/23 114/80    Physical Exam  Wt Readings from Last 3 Encounters:  01/25/24 205 lb (93 kg)  01/24/24 204 lb (92.5 kg)  12/05/23 200 lb (90.7 kg)    There were no vitals taken for this visit.  Assessment and Plan:  Problem List Items Addressed This Visit   None   No follow-ups on file.    Leita HILARIO Adie, MD [facility] [place]

## 2024-02-16 NOTE — Telephone Encounter (Signed)
 Copied from CRM #[id number]. Topic: Clinical - Medication Question >> Feb 15, 2024  5:15 PM Sophia H wrote: Reason for CRM: Patient is requesting an RX from PCP for Premarin  cream. States it normally lasts her a while which is probably why it is not listed in her chart, spoke with her OB and was advised to reach out to her PCP. Please advise if can be sent in.    [id number] - [place], [place] - [address] [place] [address]

## 2024-02-16 NOTE — Telephone Encounter (Signed)
 Please review and send RX if appropriate. Thank you.  JM

## 2024-02-28 ENCOUNTER — Telehealth: Payer: Self-pay

## 2024-02-28 ENCOUNTER — Other Ambulatory Visit: Payer: Self-pay | Admitting: Internal Medicine

## 2024-02-28 DIAGNOSIS — Z8711 Personal history of peptic ulcer disease: Secondary | ICD-10-CM

## 2024-02-28 NOTE — Progress Notes (Unsigned)
 Date:  02/28/2024   Name:  Alicia Nichols   DOB:  1960/11/30   MRN:  [id number]   Chief Complaint: No chief complaint on file.  HPI  Review of Systems   Lab Results  Component Value Date   NA 136 (A) 01/10/2024   K 4.3 01/10/2024   CO2 23 (A) 01/10/2024   GLUCOSE 90 12/07/2022   BUN 17 01/10/2024   CREATININE 1.0 01/10/2024   CALCIUM 9.4 01/10/2024   EGFR 67 01/10/2024   GFRNONAA 83 11/20/2019   Lab Results  Component Value Date   CHOL 242 (H) 12/07/2022   HDL 51 12/07/2022   LDLCALC 160 (H) 12/07/2022   TRIG 172 (H) 12/07/2022   CHOLHDL 4.7 (H) 12/07/2022   Lab Results  Component Value Date   TSH 1.430 12/07/2022   Lab Results  Component Value Date   HGBA1C 5.2 12/01/2021   Lab Results  Component Value Date   WBC 4.7 12/07/2022   HGB 11.7 12/07/2022   HCT 35.5 12/07/2022   MCV 87 12/07/2022   PLT 392 12/07/2022   Lab Results  Component Value Date   ALT 13 12/07/2022   AST 16 12/07/2022   ALKPHOS 92 12/07/2022   BILITOT 0.2 12/07/2022   No results found for: MARIEN BOLLS, VD25OH   Patient Active Problem List   Diagnosis Date Noted   Cervical disc disease 12/05/2023   Degeneration of intervertebral disc of lumbar region with discogenic back pain 12/05/2023   Chronic pain syndrome 12/05/2023   Refusal of blood transfusions as patient is Jehovah's Witness 10/05/2022   Pruritic condition 10/05/2022   Vaginal atrophy 10/05/2022   Generalized anxiety disorder 01/22/2022   Dyspareunia in female 06/30/2021   Chronic idiopathic constipation 11/20/2019   Drug-induced constipation 11/10/2017   Polyp of sigmoid colon    Hyperlipidemia, mild 11/10/2016   Rosacea 10/21/2015   Fibrocystic breast changes 06/06/2015   Essential (primary) hypertension 12/03/2014   Urge incontinence 12/03/2014   H/O peptic ulcer 12/03/2014   Fibromyalgia 12/03/2014   Migraine without aura and without status migrainosus, not intractable 12/03/2014   Mild  intermittent asthma without complication 12/03/2014   Idiopathic insomnia 12/03/2014    Allergies  Allergen Reactions   Aspirin Shortness Of Breath   Hydrocodone Shortness Of Breath   Ambien [Zolpidem]     Suicidal, also caused vision issues   Bupropion Hcl     intolerance   Divalproex Sodium Er Nausea And Vomiting   Duloxetine Hcl     intolerance   Escitalopram     intolerance   Macrobid  [Nitrofurantoin ]     Severe abdominal pain   Morphine Sulfate Itching   Naproxen     delusions   Other Swelling    Beer and liquor but not sure what the main cause is   Oxycodone  Hcl Nausea And Vomiting    Can only take the Mallincrodt manufactured    Ampicillin Rash   Codeine  Itching and Rash        Epinephrine  Anxiety   Latex Rash    Gloves - when worn for extended period   Oxybutynin  Other (See Comments)    Headache   Sulfa Antibiotics Rash    Past Surgical History:  Procedure Laterality Date   arm surgery Left 2018   reduction osteotomy   BACK SURGERY     BILATERAL CARPAL TUNNEL RELEASE Bilateral 2017   CATARACT EXTRACTION W/PHACO Right 04/16/2019   Procedure: CATARACT EXTRACTION PHACO AND INTRAOCULAR LENS PLACEMENT (  IOC) RIGHT SYMFONY LENS 00:31.0  9.5%  3.03;  Surgeon: Myrna Adine Anes, MD;  Location: [facility];  Service: Ophthalmology;  Laterality: Right;  Latex   CATARACT EXTRACTION W/PHACO Left 05/14/2019   Procedure: CATARACT EXTRACTION PHACO AND INTRAOCULAR LENS PLACEMENT (IOC) LEFT SYMFONY LENS, 0.25, 00:04.9;  Surgeon: Myrna Adine Anes, MD;  Location: [facility];  Service: Ophthalmology;  Laterality: Left;   COLONOSCOPY WITH PROPOFOL  N/A 12/16/2016   Procedure: COLONOSCOPY WITH PROPOFOL ;  Surgeon: Jinny Carmine, MD;  Location: [facility];  Service: Endoscopy;  Laterality: N/A;   HAND SURGERY Left 2008   trauma surgery after dog bite   INSERTION OF MESH N/A 11/25/2022   Procedure: INSERTION OF MESH;  Surgeon: Desiderio Schanz, MD;  Location:  [facility];  Service: General;  Laterality: N/A;   KNEE ARTHROSCOPY     LAMINECTOMY  1992   MENISCUS REPAIR Right 01/2014   POLYPECTOMY  12/16/2016   Procedure: POLYPECTOMY INTESTINAL;  Surgeon: Jinny Carmine, MD;  Location: [facility];  Service: Endoscopy;;   TOTAL ABDOMINAL HYSTERECTOMY  2011   total with BSO by patient report   XI ROBOTIC ASSISTED VENTRAL HERNIA N/A 11/25/2022   Procedure: XI ROBOTIC ASSISTED VENTRAL HERNIA, incarcerated;  Surgeon: Desiderio Schanz, MD;  Location: [facility];  Service: General;  Laterality: N/A;    Social History   Tobacco Use   Smoking status: Former    Current packs/day: 0.00    Types: Cigarettes    Start date: 1978    Quit date: 1988    Years since quitting: 37.7    Passive exposure: Past   Smokeless tobacco: Never  Vaping Use   Vaping status: Never Used  Substance Use Topics   Alcohol use: Yes    Comment: may have a drink several times per year, monthly or less   Drug use: No     Medication list has been reviewed and updated.  No outpatient medications have been marked as taking for the 02/28/24 encounter (Orders Only) with Alicia Leita DEL, MD.       01/24/2024    3:28 PM 12/05/2023    4:16 PM 12/13/2022    1:42 PM 12/07/2022   10:14 AM  GAD 7 : Generalized Anxiety Score  Nervous, Anxious, on Edge 1 1 0 2  Control/stop worrying 0 0 0 0  Worry too much - different things 0 0 0 1  Trouble relaxing 1 1 0 3  Restless 0 0 3 2  Easily annoyed or irritable 1 1 0 0  Afraid - awful might happen 0 0 0 0  Total GAD 7 Score 3 3 3 8   Anxiety Difficulty Not difficult at all Not difficult at all Not difficult at all Not difficult at all       01/25/2024    3:08 PM 01/24/2024    3:27 PM 12/05/2023    4:13 PM  Depression screen PHQ 2/9  Decreased Interest 0 0 0  Down, Depressed, Hopeless 0 0 1  PHQ - 2 Score 0 0 1  Altered sleeping 0 0 1  Tired, decreased energy 0 0 2  Change in appetite 0 0 1  Feeling bad or failure about yourself   0 0 0  Trouble concentrating 0 0 0  Moving slowly or fidgety/restless 0 0 0  Suicidal thoughts 0 0 0  PHQ-9 Score 0 0 5  Difficult doing work/chores Not difficult at all Not difficult at all Somewhat difficult    BP  Readings from Last 3 Encounters:  01/24/24 122/76  12/05/23 (!) 144/86  02/02/23 114/80    Physical Exam  Wt Readings from Last 3 Encounters:  01/25/24 205 lb (93 kg)  01/24/24 204 lb (92.5 kg)  12/05/23 200 lb (90.7 kg)    There were no vitals taken for this visit.  Assessment and Plan:  Problem List Items Addressed This Visit   None   No follow-ups on file.    Leita HILARIO Adie, MD [facility] [place]

## 2024-02-28 NOTE — Telephone Encounter (Signed)
Sent pt message on Mychart.  KP

## 2024-02-28 NOTE — Telephone Encounter (Signed)
 Copied from CRM #[id number]. Topic: Referral - Question >> Feb 28, 2024 12:17 PM Sophia H wrote: Reason for CRM: Patient is requesting a referral to GI, states she spoke with provider about this previously. Has been experiencing digestive issues and would like to follow up with them. Please advise # [phone]

## 2024-03-01 ENCOUNTER — Other Ambulatory Visit: Payer: Self-pay | Admitting: Internal Medicine

## 2024-03-01 DIAGNOSIS — Z8711 Personal history of peptic ulcer disease: Secondary | ICD-10-CM

## 2024-03-02 ENCOUNTER — Other Ambulatory Visit: Payer: Self-pay

## 2024-03-02 NOTE — Telephone Encounter (Signed)
 Requested medication (s) are due for refill today: yes  Requested medication (s) are on the active medication list: yes  Last refill:  11/25/23 #90  Future visit scheduled: yes  Notes to clinic:  overdue labs   Requested Prescriptions  Pending Prescriptions Disp Refills   lansoprazole  (PREVACID ) 30 MG capsule [Pharmacy Med Name: LANSOPRAZOLE  30MG  DR CAPSULES] 90 capsule 0    Sig: TAKE 1 CAPSULE BY MOUTH DAILY AT 12 NOON     Gastroenterology: Proton Pump Inhibitors 2 Failed - 03/02/2024 12:42 PM      Failed - ALT in normal range and within 360 days    ALT  Date Value Ref Range Status  12/07/2022 13 0 - 32 IU/L Final         Failed - AST in normal range and within 360 days    AST  Date Value Ref Range Status  12/07/2022 16 0 - 40 IU/L Final         Passed - Valid encounter within last 12 months    Recent Outpatient Visits           1 month ago Essential (primary) hypertension   [place] [facility] at [facility], Leita DEL, MD   2 months ago Essential (primary) hypertension   [facility] at [facility], Leita DEL, MD

## 2024-03-06 DIAGNOSIS — Z79899 Other long term (current) drug therapy: Secondary | ICD-10-CM | POA: Diagnosis not present

## 2024-03-06 DIAGNOSIS — G8929 Other chronic pain: Secondary | ICD-10-CM | POA: Diagnosis not present

## 2024-03-06 DIAGNOSIS — M25561 Pain in right knee: Secondary | ICD-10-CM | POA: Diagnosis not present

## 2024-03-06 DIAGNOSIS — M5416 Radiculopathy, lumbar region: Secondary | ICD-10-CM | POA: Diagnosis not present

## 2024-03-14 ENCOUNTER — Other Ambulatory Visit: Payer: Self-pay | Admitting: Internal Medicine

## 2024-03-14 DIAGNOSIS — H93293 Other abnormal auditory perceptions, bilateral: Secondary | ICD-10-CM | POA: Diagnosis not present

## 2024-03-14 DIAGNOSIS — L299 Pruritus, unspecified: Secondary | ICD-10-CM | POA: Diagnosis not present

## 2024-03-14 DIAGNOSIS — I10 Essential (primary) hypertension: Secondary | ICD-10-CM

## 2024-03-14 NOTE — Telephone Encounter (Unsigned)
 Copied from CRM #[id number]. Topic: Clinical - Medication Refill >> Mar 14, 2024  2:31 PM Yolanda T wrote: Medication: lisinopril -hydrochlorothiazide  (ZESTORETIC ) 20-12.5 MG tablet   Has the patient contacted their pharmacy? No  This is the patient's preferred pharmacy:  [id number], [place] - [address] AT [facility] [address] [facility] [place] [address] Phone: [phone] Fax: [phone]  Is this the correct pharmacy for this prescription? Yes  Has the prescription been filled recently? Yes  Is the patient out of the medication? Yes  Has the patient been seen for an appointment in the last year OR does the patient have an upcoming appointment? Yes  Can we respond through MyChart? Yes  Agent: Please be advised that Rx refills may take up to 3 business days. We ask that you follow-up with your pharmacy.

## 2024-03-16 MED ORDER — LISINOPRIL-HYDROCHLOROTHIAZIDE 20-12.5 MG PO TABS: 2.0000 | ORAL_TABLET | Freq: Every day | ORAL | 0 refills | Status: DC

## 2024-03-16 NOTE — Telephone Encounter (Signed)
 Requested Prescriptions  Pending Prescriptions Disp Refills   lisinopril -hydrochlorothiazide  (ZESTORETIC ) 20-12.5 MG tablet 180 tablet 0    Sig: Take 2 tablets by mouth daily.     Cardiovascular:  ACEI + Diuretic Combos Failed - 03/16/2024 12:00 PM      Failed - Na in normal range and within 180 days    Sodium  Date Value Ref Range Status  01/10/2024 136 (A) 137 - 147 Final         Passed - K in normal range and within 180 days    Potassium  Date Value Ref Range Status  01/10/2024 4.3 3.5 - 5.1 mEq/L Final         Passed - Cr in normal range and within 180 days    Creatinine  Date Value Ref Range Status  01/10/2024 1.0 0.5 - 1.1 Final   Creatinine, Ser  Date Value Ref Range Status  12/07/2022 0.95 0.57 - 1.00 mg/dL Final         Passed - eGFR is 30 or above and within 180 days    GFR calc Af Amer  Date Value Ref Range Status  11/20/2019 96 >59 mL/min/1.73 Final    Comment:    **[facility] currently reports eGFR in compliance with the current**   recommendations of the [employer]. [facility] will   update reporting as new guidelines are published from the NKF-ASN   Task force.    GFR calc non Af Amer  Date Value Ref Range Status  11/20/2019 83 >59 mL/min/1.73 Final   eGFR  Date Value Ref Range Status  01/10/2024 67  Final  12/07/2022 69 >59 mL/min/1.73 Final         Passed - Patient is not pregnant      Passed - Last BP in normal range    BP Readings from Last 1 Encounters:  01/24/24 122/76         Passed - Valid encounter within last 6 months    Recent Outpatient Visits           1 month ago Essential (primary) hypertension   [place] [facility] at [facility], Leita DEL, MD   3 months ago Essential (primary) hypertension   [facility] at [facility], Leita DEL, MD

## 2024-03-21 ENCOUNTER — Telehealth: Payer: Self-pay | Admitting: Internal Medicine

## 2024-03-21 NOTE — Telephone Encounter (Signed)
 Tried to call back. Was on hold for over 10 mins so had to hang up.

## 2024-03-21 NOTE — Telephone Encounter (Signed)
 Copied from CRM #[id number]. Topic: General - Other >> Mar 20, 2024  5:25 PM Zebedee SAUNDERS wrote: Reason for CRM: Received call [facility] per [place] ph: [phone] reference case ID: [id number], regarding isinopril-hydrochlorothiazide  (ZESTORETIC ) 20-12.5 MG tablet dosage amount of medication please confirm.

## 2024-03-27 DIAGNOSIS — G8929 Other chronic pain: Secondary | ICD-10-CM | POA: Diagnosis not present

## 2024-03-27 DIAGNOSIS — M25561 Pain in right knee: Secondary | ICD-10-CM | POA: Diagnosis not present

## 2024-03-27 DIAGNOSIS — Z79899 Other long term (current) drug therapy: Secondary | ICD-10-CM | POA: Diagnosis not present

## 2024-03-27 DIAGNOSIS — M79671 Pain in right foot: Secondary | ICD-10-CM | POA: Diagnosis not present

## 2024-03-30 ENCOUNTER — Other Ambulatory Visit: Payer: Self-pay | Admitting: Internal Medicine

## 2024-03-30 DIAGNOSIS — I10 Essential (primary) hypertension: Secondary | ICD-10-CM

## 2024-03-30 MED ORDER — LISINOPRIL-HYDROCHLOROTHIAZIDE 20-12.5 MG PO TABS: 1.5000 | ORAL_TABLET | Freq: Every day | ORAL | 0 refills | Status: DC

## 2024-03-30 NOTE — Progress Notes (Unsigned)
 Date:  03/30/2024   Name:  Alicia Nichols   DOB:  [birth year]   MRN:  [id number]   Chief Complaint: No chief complaint on file.  HPI  Review of Systems   Lab Results  Component Value Date   NA 136 (A) 01/10/2024   K 4.3 01/10/2024   CO2 23 (A) 01/10/2024   GLUCOSE 90 12/07/2022   BUN 17 01/10/2024   CREATININE 1.0 01/10/2024   CALCIUM 9.4 01/10/2024   EGFR 67 01/10/2024   GFRNONAA 83 11/20/2019   Lab Results  Component Value Date   CHOL 242 (H) 12/07/2022   HDL 51 12/07/2022   LDLCALC 160 (H) 12/07/2022   TRIG 172 (H) 12/07/2022   CHOLHDL 4.7 (H) 12/07/2022   Lab Results  Component Value Date   TSH 1.430 12/07/2022   Lab Results  Component Value Date   HGBA1C 5.2 12/01/2021   Lab Results  Component Value Date   WBC 4.7 12/07/2022   HGB 11.7 12/07/2022   HCT 35.5 12/07/2022   MCV 87 12/07/2022   PLT 392 12/07/2022   Lab Results  Component Value Date   ALT 13 12/07/2022   AST 16 12/07/2022   ALKPHOS 92 12/07/2022   BILITOT 0.2 12/07/2022   No results found for: MARIEN BOLLS, VD25OH   Patient Active Problem List   Diagnosis Date Noted   Cervical disc disease 12/05/2023   Degeneration of intervertebral disc of lumbar region with discogenic back pain 12/05/2023   Chronic pain syndrome 12/05/2023   Refusal of blood transfusions as patient is Jehovah's Witness 10/05/2022   Pruritic condition 10/05/2022   Vaginal atrophy 10/05/2022   Generalized anxiety disorder 01/22/2022   Dyspareunia in female 06/30/2021   Chronic idiopathic constipation 11/20/2019   Drug-induced constipation 11/10/2017   Polyp of sigmoid colon    Hyperlipidemia, mild 11/10/2016   Rosacea 10/21/2015   Fibrocystic breast changes 06/06/2015   Essential (primary) hypertension 12/03/2014   Urge incontinence 12/03/2014   H/O peptic ulcer 12/03/2014   Fibromyalgia 12/03/2014   Migraine without aura and without status migrainosus, not intractable 12/03/2014   Mild  intermittent asthma without complication 12/03/2014   Idiopathic insomnia 12/03/2014    Allergies  Allergen Reactions   Aspirin Shortness Of Breath   Hydrocodone Shortness Of Breath   Ambien [Zolpidem]     Suicidal, also caused vision issues   Bupropion Hcl     intolerance   Divalproex Sodium Er Nausea And Vomiting   Duloxetine Hcl     intolerance   Escitalopram     intolerance   Macrobid  [Nitrofurantoin ]     Severe abdominal pain   Morphine Sulfate Itching   Naproxen     delusions   Other Swelling    Beer and liquor but not sure what the main cause is   Oxycodone  Hcl Nausea And Vomiting    Can only take the Mallincrodt manufactured    Ampicillin Rash   Codeine  Itching and Rash        Epinephrine  Anxiety   Latex Rash    Gloves - when worn for extended period   Oxybutynin  Other (See Comments)    Headache   Sulfa Antibiotics Rash    Past Surgical History:  Procedure Laterality Date   arm surgery Left 2018   reduction osteotomy   BACK SURGERY     BILATERAL CARPAL TUNNEL RELEASE Bilateral 2017   CATARACT EXTRACTION W/PHACO Right 04/16/2019   Procedure: CATARACT EXTRACTION PHACO AND INTRAOCULAR LENS PLACEMENT (  IOC) RIGHT SYMFONY LENS 00:31.0  9.5%  3.03;  Surgeon: Myrna Adine Anes, MD;  Location: [facility];  Service: Ophthalmology;  Laterality: Right;  Latex   CATARACT EXTRACTION W/PHACO Left 05/14/2019   Procedure: CATARACT EXTRACTION PHACO AND INTRAOCULAR LENS PLACEMENT (IOC) LEFT SYMFONY LENS, 0.25, 00:04.9;  Surgeon: Myrna Adine Anes, MD;  Location: [facility];  Service: Ophthalmology;  Laterality: Left;   COLONOSCOPY WITH PROPOFOL  N/A 12/16/2016   Procedure: COLONOSCOPY WITH PROPOFOL ;  Surgeon: Jinny Carmine, MD;  Location: [facility];  Service: Endoscopy;  Laterality: N/A;   HAND SURGERY Left 2008   trauma surgery after dog bite   INSERTION OF MESH N/A 11/25/2022   Procedure: INSERTION OF MESH;  Surgeon: Desiderio Schanz, MD;  Location:  [facility];  Service: General;  Laterality: N/A;   KNEE ARTHROSCOPY     LAMINECTOMY  1992   MENISCUS REPAIR Right 01/2014   POLYPECTOMY  12/16/2016   Procedure: POLYPECTOMY INTESTINAL;  Surgeon: Jinny Carmine, MD;  Location: [facility];  Service: Endoscopy;;   TOTAL ABDOMINAL HYSTERECTOMY  2011   total with BSO by patient report   XI ROBOTIC ASSISTED VENTRAL HERNIA N/A 11/25/2022   Procedure: XI ROBOTIC ASSISTED VENTRAL HERNIA, incarcerated;  Surgeon: Desiderio Schanz, MD;  Location: [facility];  Service: General;  Laterality: N/A;    Social History   Tobacco Use   Smoking status: Former    Current packs/day: 0.00    Types: Cigarettes    Start date: 1978    Quit date: 1988    Years since quitting: 37.8    Passive exposure: Past   Smokeless tobacco: Never  Vaping Use   Vaping status: Never Used  Substance Use Topics   Alcohol use: Yes    Comment: may have a drink several times per year, monthly or less   Drug use: No     Medication list has been reviewed and updated.  No outpatient medications have been marked as taking for the 03/30/24 encounter (Orders Only) with [name], MD.       01/24/2024    3:28 PM 12/05/2023    4:16 PM 12/13/2022    1:42 PM 12/07/2022   10:14 AM  GAD 7 : Generalized Anxiety Score  Nervous, Anxious, on Edge 1 1 0 2  Control/stop worrying 0 0 0 0  Worry too much - different things 0 0 0 1  Trouble relaxing 1 1 0 3  Restless 0 0 3 2  Easily annoyed or irritable 1 1 0 0  Afraid - awful might happen 0 0 0 0  Total GAD 7 Score 3 3 3 8   Anxiety Difficulty Not difficult at all Not difficult at all Not difficult at all Not difficult at all       01/25/2024    3:08 PM 01/24/2024    3:27 PM 12/05/2023    4:13 PM  Depression screen PHQ 2/9  Decreased Interest 0 0 0  Down, Depressed, Hopeless 0 0 1  PHQ - 2 Score 0 0 1  Altered sleeping 0 0 1  Tired, decreased energy 0 0 2  Change in appetite 0 0 1  Feeling bad or failure about yourself   0 0 0  Trouble concentrating 0 0 0  Moving slowly or fidgety/restless 0 0 0  Suicidal thoughts 0 0 0  PHQ-9 Score 0 0 5  Difficult doing work/chores Not difficult at all Not difficult at all Somewhat difficult    BP  Readings from Last 3 Encounters:  01/24/24 122/76  12/05/23 (!) 144/86  02/02/23 114/80    Physical Exam  Wt Readings from Last 3 Encounters:  01/25/24 205 lb (93 kg)  01/24/24 204 lb (92.5 kg)  12/05/23 200 lb (90.7 kg)    There were no vitals taken for this visit.  Assessment and Plan:  Problem List Items Addressed This Visit   None   No follow-ups on file.    Leita HILARIO Adie, MD [facility] [place]

## 2024-04-16 ENCOUNTER — Encounter: Payer: Self-pay | Admitting: Internal Medicine

## 2024-04-16 DIAGNOSIS — Z1231 Encounter for screening mammogram for malignant neoplasm of breast: Secondary | ICD-10-CM | POA: Diagnosis not present

## 2024-04-16 LAB — HM MAMMOGRAPHY

## 2024-05-11 ENCOUNTER — Encounter: Payer: Self-pay | Admitting: Internal Medicine

## 2024-05-11 ENCOUNTER — Ambulatory Visit: Admitting: Internal Medicine

## 2024-05-11 VITALS — BP 130/62 | HR 79 | Resp 16 | Ht 66.0 in | Wt 199.0 lb

## 2024-05-11 DIAGNOSIS — I10 Essential (primary) hypertension: Secondary | ICD-10-CM

## 2024-05-11 DIAGNOSIS — Z8711 Personal history of peptic ulcer disease: Secondary | ICD-10-CM | POA: Diagnosis not present

## 2024-05-11 DIAGNOSIS — E782 Mixed hyperlipidemia: Secondary | ICD-10-CM

## 2024-05-11 DIAGNOSIS — M797 Fibromyalgia: Secondary | ICD-10-CM

## 2024-05-11 DIAGNOSIS — Z23 Encounter for immunization: Secondary | ICD-10-CM | POA: Diagnosis not present

## 2024-05-11 DIAGNOSIS — Z0000 Encounter for general adult medical examination without abnormal findings: Secondary | ICD-10-CM

## 2024-05-11 DIAGNOSIS — J4520 Mild intermittent asthma, uncomplicated: Secondary | ICD-10-CM

## 2024-05-11 DIAGNOSIS — G43009 Migraine without aura, not intractable, without status migrainosus: Secondary | ICD-10-CM

## 2024-05-11 MED ORDER — LISINOPRIL-HYDROCHLOROTHIAZIDE 20-12.5 MG PO TABS: 2.0000 | ORAL_TABLET | Freq: Every day | ORAL | 1 refills | Status: AC

## 2024-05-11 MED ORDER — ALBUTEROL SULFATE HFA 108 (90 BASE) MCG/ACT IN AERS: 2.0000 | INHALATION_SPRAY | Freq: Four times a day (QID) | RESPIRATORY_TRACT | 0 refills | Status: AC | PRN

## 2024-05-11 MED ORDER — LANSOPRAZOLE 30 MG PO CPDR: 30.0000 mg | DELAYED_RELEASE_CAPSULE | Freq: Two times a day (BID) | ORAL | 1 refills | Status: AC

## 2024-05-11 NOTE — Assessment & Plan Note (Signed)
 Moderately high trigs and LDL Will repeat labs and advise

## 2024-05-11 NOTE — Progress Notes (Signed)
 Date:  05/11/2024   Name:  Alicia Nichols   DOB:  [birth year]   MRN:  [id number]   Chief Complaint: Annual Exam (CPE) Alicia Nichols is a 63 y.o. female who presents today for her Complete Annual Exam. She feels fairly well. 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 She reports she is sleeping fairly well. Breast complaints none. Recent mammogram was normal.  Health Maintenance  Topic Date Due   Zoster (Shingles) Vaccine (1 of 2) Never done   COVID-19 Vaccine (3 - Pfizer risk series) 01/03/2020   DTaP/Tdap/Td vaccine (2 - Td or Tdap) 04/25/2023   Flu Shot  09/04/2024*   Medicare Annual Wellness Visit  01/24/2025   Breast Cancer Screening  04/16/2025   Colon Cancer Screening  12/17/2026   Pneumococcal Vaccine for age over 27  Completed   Hepatitis C Screening  Completed   HIV Screening  Completed   Hepatitis B Vaccine  Aged Out   HPV Vaccine  Aged Out   Meningitis B Vaccine  Aged Out  *Topic was postponed. The date shown is not the original due date.    Hypertension This is a chronic problem. The problem is controlled. Associated symptoms include headaches. Pertinent negatives include no chest pain, palpitations or shortness of breath. Past treatments include ACE inhibitors and diuretics.  Gastroesophageal Reflux She complains of heartburn. She reports no abdominal pain, no chest pain, no coughing or no wheezing. Pertinent negatives include no fatigue. She has tried a PPI for the symptoms.  Migraine  This is a recurrent problem. The problem occurs intermittently. The pain quality is similar to prior headaches. Associated symptoms include back pain. Pertinent negatives include no abdominal pain, coughing, dizziness or weakness. She has tried triptans for the symptoms. The treatment provided significant relief. Her past medical history is significant for hypertension.    Review of Systems  Constitutional:  Negative for fatigue and unexpected weight change.   HENT:  Negative for trouble swallowing.   Eyes:  Negative for visual disturbance.  Respiratory:  Negative for cough, chest tightness, shortness of breath and wheezing.   Cardiovascular:  Negative for chest pain, palpitations and leg swelling.  Gastrointestinal:  Positive for heartburn. Negative for abdominal pain, constipation and diarrhea.  Genitourinary:  Positive for frequency and urgency. Negative for dysuria and hematuria.  Musculoskeletal:  Positive for arthralgias, back pain and myalgias.       She fell 2 nights ago and strained her ankle and right shoulder  Neurological:  Positive for headaches. Negative for dizziness, weakness and light-headedness.  Psychiatric/Behavioral:  Positive for sleep disturbance. Negative for dysphoric mood. The patient is not nervous/anxious.      Lab Results  Component Value Date   NA 136 (A) 01/10/2024   K 4.3 01/10/2024   CO2 23 (A) 01/10/2024   GLUCOSE 90 12/07/2022   BUN 17 01/10/2024   CREATININE 1.0 01/10/2024   CALCIUM 9.4 01/10/2024   EGFR 67 01/10/2024   GFRNONAA 83 11/20/2019   Lab Results  Component Value Date   CHOL 242 (H) 12/07/2022   HDL 51 12/07/2022   LDLCALC 160 (H) 12/07/2022   TRIG 172 (H) 12/07/2022   CHOLHDL 4.7 (H) 12/07/2022   Lab Results  Component Value Date   TSH 1.430 12/07/2022   Lab Results  Component Value Date   HGBA1C 5.2 12/01/2021   Lab Results  Component Value Date   WBC 4.7 12/07/2022   HGB 11.7 12/07/2022  HCT 35.5 12/07/2022   MCV 87 12/07/2022   PLT 392 12/07/2022   Lab Results  Component Value Date   ALT 13 12/07/2022   AST 16 12/07/2022   ALKPHOS 92 12/07/2022   BILITOT 0.2 12/07/2022   No results found for: MARIEN BOLLS, VD25OH   Patient Active Problem List   Diagnosis Date Noted   Cervical disc disease 12/05/2023   Degeneration of intervertebral disc of lumbar region with discogenic back pain 12/05/2023   Chronic pain syndrome 12/05/2023   Refusal of blood  transfusions as patient is Jehovah's Witness 10/05/2022   Pruritic condition 10/05/2022   Vaginal atrophy 10/05/2022   Generalized anxiety disorder 01/22/2022   Dyspareunia in female 06/30/2021   Chronic idiopathic constipation 11/20/2019   Polyp of sigmoid colon    Mixed hyperlipidemia 11/10/2016   Rosacea 10/21/2015   Fibrocystic breast changes 06/06/2015   Essential (primary) hypertension 12/03/2014   Urge incontinence 12/03/2014   H/O peptic ulcer 12/03/2014   Fibromyalgia 12/03/2014   Migraine without aura and without status migrainosus, not intractable 12/03/2014   Mild intermittent asthma without complication 12/03/2014   Idiopathic insomnia 12/03/2014    Allergies  Allergen Reactions   Aspirin Shortness Of Breath   Hydrocodone Shortness Of Breath   Ambien [Zolpidem]     Suicidal, also caused vision issues   Bupropion Hcl     intolerance   Divalproex Sodium Er Nausea And Vomiting   Duloxetine Hcl     intolerance   Escitalopram     intolerance   Macrobid  [Nitrofurantoin ]     Severe abdominal pain   Morphine Sulfate Itching   Naproxen     delusions   Other Swelling    Beer and liquor but not sure what the main cause is   Oxycodone  Hcl Nausea And Vomiting    Can only take the Mallincrodt manufactured    Ampicillin Rash   Codeine  Itching and Rash        Epinephrine  Anxiety   Latex Rash    Gloves - when worn for extended period   Oxybutynin  Other (See Comments)    Headache   Sulfa Antibiotics Rash    Past Surgical History:  Procedure Laterality Date   arm surgery Left 2018   reduction osteotomy   BACK SURGERY     BILATERAL CARPAL TUNNEL RELEASE Bilateral 2017   CATARACT EXTRACTION W/PHACO Right 04/16/2019   Procedure: CATARACT EXTRACTION PHACO AND INTRAOCULAR LENS PLACEMENT (IOC) RIGHT SYMFONY LENS 00:31.0  9.5%  3.03;  Surgeon: Myrna Adine Anes, MD;  Location: [facility];  Service: Ophthalmology;  Laterality: Right;  Latex   CATARACT  EXTRACTION W/PHACO Left 05/14/2019   Procedure: CATARACT EXTRACTION PHACO AND INTRAOCULAR LENS PLACEMENT (IOC) LEFT SYMFONY LENS, 0.25, 00:04.9;  Surgeon: Myrna Adine Anes, MD;  Location: [facility];  Service: Ophthalmology;  Laterality: Left;   COLONOSCOPY WITH PROPOFOL  N/A 12/16/2016   Procedure: COLONOSCOPY WITH PROPOFOL ;  Surgeon: Jinny Carmine, MD;  Location: [facility];  Service: Endoscopy;  Laterality: N/A;   HAND SURGERY Left 2008   trauma surgery after dog bite   INSERTION OF MESH N/A 11/25/2022   Procedure: INSERTION OF MESH;  Surgeon: Desiderio Schanz, MD;  Location: [facility];  Service: General;  Laterality: N/A;   KNEE ARTHROSCOPY     LAMINECTOMY  1992   MENISCUS REPAIR Right 01/2014   POLYPECTOMY  12/16/2016   Procedure: POLYPECTOMY INTESTINAL;  Surgeon: Jinny Carmine, MD;  Location: [facility];  Service: Endoscopy;;  TOTAL ABDOMINAL HYSTERECTOMY  2011   total with BSO by patient report   XI ROBOTIC ASSISTED VENTRAL HERNIA N/A 11/25/2022   Procedure: XI ROBOTIC ASSISTED VENTRAL HERNIA, incarcerated;  Surgeon: Desiderio Schanz, MD;  Location: [facility];  Service: General;  Laterality: N/A;    Social History   Tobacco Use   Smoking status: Former    Current packs/day: 0.00    Types: Cigarettes    Start date: 63    Quit date: 1988    Years since quitting: 37.9    Passive exposure: Past   Smokeless tobacco: Never  Vaping Use   Vaping status: Never Used  Substance Use Topics   Alcohol use: Yes    Comment: may have a drink several times per year, monthly or less   Drug use: No     Medication list has been reviewed and updated.  Current Meds  Medication Sig   acetaminophen  (TYLENOL ) 500 MG tablet Take 2 tablets (1,000 mg total) by mouth every 6 (six) hours as needed for mild pain.   albuterol  (VENTOLIN  HFA) 108 (90 Base) MCG/ACT inhaler Inhale 2 puffs into the lungs every 6 (six) hours as needed for wheezing or shortness of breath.    conjugated estrogens  (PREMARIN ) vaginal cream Insert 1 g vaginally once weekly as maintenance   cyanocobalamin (VITAMIN B12) 1000 MCG tablet Take 1,000 mcg by mouth as needed.   diclofenac (VOLTAREN) 75 MG EC tablet Take 75 mg by mouth 2 (two) times daily as needed.   diclofenac Sodium (VOLTAREN) 1 % GEL Apply topically as needed.   diphenhydrAMINE (BENADRYL) 50 MG capsule Take 50 mg by mouth every 6 (six) hours as needed.   lansoprazole  (PREVACID ) 30 MG capsule Take 1 capsule (30 mg total) by mouth 2 (two) times daily before a meal.   lisinopril -hydrochlorothiazide  (ZESTORETIC ) 20-12.5 MG tablet Take 2 tablets by mouth daily.   metaxalone (SKELAXIN) 800 MG tablet Take 800 mg by mouth daily. 1/2 at bedtime and as needed   Multiple Vitamins-Minerals (PRESERVISION AREDS PO) Take 1 tablet by mouth 2 (two) times daily.   oxyCODONE -acetaminophen  (PERCOCET) 10-325 MG tablet Take 1-2 tablets by mouth every 4 (four) hours.   Probiotic Product (PROBIOTIC DAILY PO) Take by mouth daily.   Pseudoephedrine -APAP-DM (DAYQUIL PO) Take by mouth as needed.   SUMAtriptan  (IMITREX ) 100 MG tablet TAKE 1 TABLET(100 MG) BY MOUTH DAILY AS NEEDED       05/11/2024   11:45 AM 01/24/2024    3:28 PM 12/05/2023    4:16 PM 12/13/2022    1:42 PM  GAD 7 : Generalized Anxiety Score  Nervous, Anxious, on Edge 0 1 1 0  Control/stop worrying 0 0 0 0  Worry too much - different things 0 0 0 0  Trouble relaxing 0 1 1 0  Restless 0 0 0 3  Easily annoyed or irritable 0 1 1 0  Afraid - awful might happen 0 0 0 0  Total GAD 7 Score 0 3 3 3   Anxiety Difficulty  Not difficult at all Not difficult at all Not difficult at all       05/11/2024   11:44 AM 01/25/2024    3:08 PM 01/24/2024    3:27 PM  Depression screen PHQ 2/9  Decreased Interest 0 0 0  Down, Depressed, Hopeless 0 0 0  PHQ - 2 Score 0 0 0  Altered sleeping 0 0 0  Tired, decreased energy 0 0 0  Change in appetite 0 0 0  Feeling bad or failure about yourself  0 0 0   Trouble concentrating 0 0 0  Moving slowly or fidgety/restless 0 0 0  Suicidal thoughts 0 0 0  PHQ-9 Score 0 0  0   Difficult doing work/chores Not difficult at all Not difficult at all Not difficult at all     Data saved with a previous flowsheet row definition    BP Readings from Last 3 Encounters:  05/11/24 130/62  01/24/24 122/76  12/05/23 (!) 144/86    Physical Exam Vitals and nursing note reviewed.  Constitutional:      General: She is not in acute distress.    Appearance: Normal appearance. She is well-developed.  HENT:     Head: Normocephalic and atraumatic.     Right Ear: Tympanic membrane and ear canal normal.     Left Ear: Tympanic membrane and ear canal normal.     Nose:     Right Sinus: No maxillary sinus tenderness.     Left Sinus: No maxillary sinus tenderness.  Eyes:     General: No scleral icterus.       Right eye: No discharge.        Left eye: No discharge.     Conjunctiva/sclera: Conjunctivae normal.  Neck:     Thyroid : No thyromegaly.     Vascular: No carotid bruit.  Cardiovascular:     Rate and Rhythm: Normal rate and regular rhythm.     Pulses: Normal pulses.     Heart sounds: Normal heart sounds.  Pulmonary:     Effort: Pulmonary effort is normal. No respiratory distress.     Breath sounds: No wheezing.  Abdominal:     General: Bowel sounds are normal.     Palpations: Abdomen is soft.     Tenderness: There is no abdominal tenderness.  Musculoskeletal:     Right shoulder: Decreased range of motion.     Cervical back: Normal range of motion. No erythema.     Right lower leg: No edema.     Left lower leg: No edema.  Lymphadenopathy:     Cervical: No cervical adenopathy.  Skin:    General: Skin is warm and dry.     Capillary Refill: Capillary refill takes less than 2 seconds.     Findings: No rash.  Neurological:     General: No focal deficit present.     Mental Status: She is alert and oriented to person, place, and time.     Cranial  Nerves: No cranial nerve deficit.     Sensory: No sensory deficit.     Deep Tendon Reflexes: Reflexes are normal and symmetric.  Psychiatric:        Attention and Perception: Attention and perception normal.        Mood and Affect: Mood normal.        Behavior: Behavior normal.     Wt Readings from Last 3 Encounters:  05/11/24 199 lb (90.3 kg)  01/25/24 205 lb (93 kg)  01/24/24 204 lb (92.5 kg)    BP 130/62 (BP Location: Left Arm, Patient Position: Sitting, Cuff Size: Normal)   Pulse 79   Resp 16   Ht 5' 6 (1.676 m)   Wt 199 lb (90.3 kg)   SpO2 97%   BMI 32.12 kg/m   Assessment and Plan:  Problem List Items Addressed This Visit       Unprioritized   Essential (primary) hypertension (Chronic)   Well controlled blood pressure today. Current  regimen is lisinopril  and hydrochlorothiazide . Will increase from 1.5 to 2 tablets daily for ease of administration.         Relevant Medications   lisinopril -hydrochlorothiazide  (ZESTORETIC ) 20-12.5 MG tablet   Other Relevant Orders   CBC with Differential/Platelet   Comprehensive metabolic panel with GFR   TSH   Urinalysis, Routine w reflex microscopic   H/O peptic ulcer (Chronic)   Currently taking Prevacid  with frequent reflux symptoms. Patient denies red flag symptoms - no melena, unintentional weight loss, dysphagia. Increase prevacid  to daily with a second dose PRN       Relevant Medications   lansoprazole  (PREVACID ) 30 MG capsule   Other Relevant Orders   CBC with Differential/Platelet   Fibromyalgia   Followed by pain clinic On Percocet 10 mg bid, Voltaren oral and gel; dry needling as needed      Relevant Medications   oxyCODONE -acetaminophen  (PERCOCET) 10-325 MG tablet   Migraine without aura and without status migrainosus, not intractable (Chronic)   No recent change in migraine headache character or frequency. Headaches continue to respond well to current therapy with Imitrex . No change in regimen  recommended at this time.         Relevant Medications   oxyCODONE -acetaminophen  (PERCOCET) 10-325 MG tablet   lisinopril -hydrochlorothiazide  (ZESTORETIC ) 20-12.5 MG tablet   Mild intermittent asthma without complication (Chronic)   Uses albuterol  MDI prn. Recently recovered from viral URI - no wheezing or shortness of breath currently.      Relevant Medications   albuterol  (VENTOLIN  HFA) 108 (90 Base) MCG/ACT inhaler   Mixed hyperlipidemia   Moderately high trigs and LDL Will repeat labs and advise      Relevant Medications   lisinopril -hydrochlorothiazide  (ZESTORETIC ) 20-12.5 MG tablet   Other Relevant Orders   Lipid panel   Other Visit Diagnoses       Annual physical exam    -  Primary   MM done last month; CRC screen UTD continue activity as tolerated, low fat diet     Immunization due       Relevant Orders   Pneumococcal conjugate vaccine 20-valent (Prevnar 20) (Completed)       No follow-ups on file.    Alicia HILARIO Adie, MD [facility] [place]

## 2024-05-11 NOTE — Assessment & Plan Note (Addendum)
 Currently taking Prevacid  with frequent reflux symptoms. Patient denies red flag symptoms - no melena, unintentional weight loss, dysphagia. Increase prevacid  to daily with a second dose PRN

## 2024-05-11 NOTE — Assessment & Plan Note (Addendum)
 Uses albuterol  MDI prn. Recently recovered from viral URI - no wheezing or shortness of breath currently.

## 2024-05-11 NOTE — Assessment & Plan Note (Addendum)
 Followed by pain clinic On Percocet 10 mg bid, Voltaren oral and gel; dry needling as needed

## 2024-05-11 NOTE — Assessment & Plan Note (Signed)
 No recent change in migraine headache character or frequency. Headaches continue to respond well to current therapy with Imitrex . No change in regimen recommended at this time.

## 2024-05-11 NOTE — Assessment & Plan Note (Addendum)
 Well controlled blood pressure today. Current regimen is lisinopril  and hydrochlorothiazide . Will increase from 1.5 to 2 tablets daily for ease of administration.

## 2024-05-15 LAB — COMPREHENSIVE METABOLIC PANEL WITH GFR
ALT: 10 IU/L (ref 0–32)
AST: 17 IU/L (ref 0–40)
Albumin: 3.9 g/dL (ref 3.9–4.9)
Alkaline Phosphatase: 93 IU/L (ref 49–135)
BUN/Creatinine Ratio: 17 (ref 12–28)
BUN: 15 mg/dL (ref 8–27)
Bilirubin Total: <0.2 mg/dL (ref 0.0–1.2)
CO2: 23 mmol/L (ref 20–29)
Calcium: 8.9 mg/dL (ref 8.7–10.3)
Chloride: 102 mmol/L (ref 96–106)
Creatinine, Ser: 0.88 mg/dL (ref 0.57–1.00)
Globulin, Total: 2.6 g/dL (ref 1.5–4.5)
Glucose: 99 mg/dL (ref 70–99)
Potassium: 3.7 mmol/L (ref 3.5–5.2)
Sodium: 139 mmol/L (ref 134–144)
Total Protein: 6.5 g/dL (ref 6.0–8.5)
eGFR: 75 mL/min/1.73 (ref >59)

## 2024-05-15 LAB — CBC WITH DIFFERENTIAL/PLATELET
Basophils Absolute: 0 x10E3/uL (ref 0.0–0.2)
Basos: 1 %
EOS (ABSOLUTE): 0.2 x10E3/uL (ref 0.0–0.4)
Eos: 6 %
Hematocrit: 34.8 % (ref 34.0–46.6)
Hemoglobin: 11 g/dL — ABNORMAL LOW (ref 11.1–15.9)
Immature Grans (Abs): 0 x10E3/uL (ref 0.0–0.1)
Immature Granulocytes: 0 %
Lymphocytes Absolute: 1.1 x10E3/uL (ref 0.7–3.1)
Lymphs: 32 %
MCH: 29.2 pg (ref 26.6–33.0)
MCHC: 31.6 g/dL (ref 31.5–35.7)
MCV: 92 fL (ref 79–97)
Monocytes Absolute: 0.4 x10E3/uL (ref 0.1–0.9)
Monocytes: 12 %
Neutrophils Absolute: 1.7 x10E3/uL (ref 1.4–7.0)
Neutrophils: 49 %
Platelets: 277 x10E3/uL (ref 150–450)
RBC: 3.77 x10E6/uL (ref 3.77–5.28)
RDW: 12.4 % (ref 11.7–15.4)
WBC: 3.5 x10E3/uL (ref 3.4–10.8)

## 2024-05-15 LAB — LIPID PANEL
Chol/HDL Ratio: 6.5 ratio — ABNORMAL HIGH (ref 0.0–4.4)
Cholesterol, Total: 253 mg/dL — ABNORMAL HIGH (ref 100–199)
HDL: 39 mg/dL — ABNORMAL LOW (ref >39)
LDL Chol Calc (NIH): 155 mg/dL — ABNORMAL HIGH (ref 0–99)
Triglycerides: 314 mg/dL — ABNORMAL HIGH (ref 0–149)
VLDL Cholesterol Cal: 59 mg/dL — ABNORMAL HIGH (ref 5–40)

## 2024-05-15 LAB — URINALYSIS, ROUTINE W REFLEX MICROSCOPIC

## 2024-05-15 LAB — TSH: TSH: 2.64 u[IU]/mL (ref 0.450–4.500)

## 2024-05-16 ENCOUNTER — Ambulatory Visit: Payer: Self-pay | Admitting: Internal Medicine

## 2024-05-16 DIAGNOSIS — E782 Mixed hyperlipidemia: Secondary | ICD-10-CM

## 2024-05-28 DIAGNOSIS — K080 Exfoliation of teeth due to systemic causes: Secondary | ICD-10-CM | POA: Diagnosis not present

## 2024-05-29 DIAGNOSIS — K080 Exfoliation of teeth due to systemic causes: Secondary | ICD-10-CM | POA: Diagnosis not present

## 2024-11-12 ENCOUNTER — Ambulatory Visit: Admitting: Student

## 2025-01-30 ENCOUNTER — Ambulatory Visit
